# Patient Record
Sex: Male | Born: 1945
Health system: Southern US, Community
[De-identification: ages and names within clinical notes are randomized; demographics above are authoritative.]

## PROBLEM LIST (undated history)

## (undated) DIAGNOSIS — N210 Calculus in bladder: Secondary | ICD-10-CM

## (undated) DIAGNOSIS — Z87442 Personal history of urinary calculi: Secondary | ICD-10-CM

## (undated) DIAGNOSIS — Z973 Presence of spectacles and contact lenses: Secondary | ICD-10-CM

## (undated) DIAGNOSIS — H269 Unspecified cataract: Secondary | ICD-10-CM

## (undated) DIAGNOSIS — K219 Gastro-esophageal reflux disease without esophagitis: Secondary | ICD-10-CM

## (undated) DIAGNOSIS — R3915 Urgency of urination: Secondary | ICD-10-CM

## (undated) DIAGNOSIS — M4800 Spinal stenosis, site unspecified: Secondary | ICD-10-CM

## (undated) DIAGNOSIS — I10 Essential (primary) hypertension: Secondary | ICD-10-CM

## (undated) HISTORY — PX: EYE SURGERY: SHX253

## (undated) HISTORY — PX: INGUINAL HERNIA REPAIR: SUR1180

## (undated) HISTORY — DX: Unspecified cataract: H26.9

## (undated) HISTORY — PX: TONSILLECTOMY: SUR1361

## (undated) HISTORY — PX: BLEPHAROPLASTY: SUR158

## (undated) HISTORY — DX: Spinal stenosis, site unspecified: M48.00

---

## 2002-03-24 HISTORY — PX: EXTRACORPOREAL SHOCK WAVE LITHOTRIPSY: SHX1557

## 2012-01-15 DIAGNOSIS — Z23 Encounter for immunization: Secondary | ICD-10-CM | POA: Diagnosis not present

## 2012-01-15 DIAGNOSIS — Z0000 Encounter for general adult medical examination without abnormal findings: Secondary | ICD-10-CM | POA: Diagnosis not present

## 2012-01-19 DIAGNOSIS — Z1211 Encounter for screening for malignant neoplasm of colon: Secondary | ICD-10-CM | POA: Diagnosis not present

## 2012-01-22 DIAGNOSIS — E559 Vitamin D deficiency, unspecified: Secondary | ICD-10-CM | POA: Diagnosis not present

## 2012-01-22 DIAGNOSIS — E569 Vitamin deficiency, unspecified: Secondary | ICD-10-CM | POA: Diagnosis not present

## 2012-01-22 DIAGNOSIS — E782 Mixed hyperlipidemia: Secondary | ICD-10-CM | POA: Diagnosis not present

## 2012-01-22 DIAGNOSIS — Z79899 Other long term (current) drug therapy: Secondary | ICD-10-CM | POA: Diagnosis not present

## 2012-01-22 DIAGNOSIS — Z125 Encounter for screening for malignant neoplasm of prostate: Secondary | ICD-10-CM | POA: Diagnosis not present

## 2012-01-28 DIAGNOSIS — D539 Nutritional anemia, unspecified: Secondary | ICD-10-CM | POA: Diagnosis not present

## 2012-02-02 DIAGNOSIS — M5380 Other specified dorsopathies, site unspecified: Secondary | ICD-10-CM | POA: Diagnosis not present

## 2012-02-02 DIAGNOSIS — M25659 Stiffness of unspecified hip, not elsewhere classified: Secondary | ICD-10-CM | POA: Diagnosis not present

## 2012-02-02 DIAGNOSIS — M999 Biomechanical lesion, unspecified: Secondary | ICD-10-CM | POA: Diagnosis not present

## 2012-02-03 DIAGNOSIS — M5380 Other specified dorsopathies, site unspecified: Secondary | ICD-10-CM | POA: Diagnosis not present

## 2012-02-03 DIAGNOSIS — M999 Biomechanical lesion, unspecified: Secondary | ICD-10-CM | POA: Diagnosis not present

## 2012-02-03 DIAGNOSIS — M25659 Stiffness of unspecified hip, not elsewhere classified: Secondary | ICD-10-CM | POA: Diagnosis not present

## 2012-02-04 DIAGNOSIS — I10 Essential (primary) hypertension: Secondary | ICD-10-CM | POA: Diagnosis not present

## 2012-09-22 DIAGNOSIS — M545 Low back pain: Secondary | ICD-10-CM | POA: Diagnosis not present

## 2012-09-22 DIAGNOSIS — M25559 Pain in unspecified hip: Secondary | ICD-10-CM | POA: Diagnosis not present

## 2012-09-27 DIAGNOSIS — M545 Low back pain: Secondary | ICD-10-CM | POA: Diagnosis not present

## 2012-09-27 DIAGNOSIS — M439 Deforming dorsopathy, unspecified: Secondary | ICD-10-CM | POA: Diagnosis not present

## 2012-09-27 DIAGNOSIS — M25559 Pain in unspecified hip: Secondary | ICD-10-CM | POA: Diagnosis not present

## 2012-10-04 DIAGNOSIS — M25559 Pain in unspecified hip: Secondary | ICD-10-CM | POA: Diagnosis not present

## 2012-10-04 DIAGNOSIS — M545 Low back pain: Secondary | ICD-10-CM | POA: Diagnosis not present

## 2012-10-04 DIAGNOSIS — M439 Deforming dorsopathy, unspecified: Secondary | ICD-10-CM | POA: Diagnosis not present

## 2012-10-08 DIAGNOSIS — M439 Deforming dorsopathy, unspecified: Secondary | ICD-10-CM | POA: Diagnosis not present

## 2012-10-08 DIAGNOSIS — M25559 Pain in unspecified hip: Secondary | ICD-10-CM | POA: Diagnosis not present

## 2012-10-08 DIAGNOSIS — M545 Low back pain: Secondary | ICD-10-CM | POA: Diagnosis not present

## 2012-10-11 DIAGNOSIS — M25559 Pain in unspecified hip: Secondary | ICD-10-CM | POA: Diagnosis not present

## 2012-10-11 DIAGNOSIS — M545 Low back pain: Secondary | ICD-10-CM | POA: Diagnosis not present

## 2012-10-11 DIAGNOSIS — M439 Deforming dorsopathy, unspecified: Secondary | ICD-10-CM | POA: Diagnosis not present

## 2012-10-18 DIAGNOSIS — M439 Deforming dorsopathy, unspecified: Secondary | ICD-10-CM | POA: Diagnosis not present

## 2012-10-18 DIAGNOSIS — M545 Low back pain: Secondary | ICD-10-CM | POA: Diagnosis not present

## 2012-10-18 DIAGNOSIS — M25559 Pain in unspecified hip: Secondary | ICD-10-CM | POA: Diagnosis not present

## 2012-10-25 DIAGNOSIS — M545 Low back pain: Secondary | ICD-10-CM | POA: Diagnosis not present

## 2012-10-25 DIAGNOSIS — M439 Deforming dorsopathy, unspecified: Secondary | ICD-10-CM | POA: Diagnosis not present

## 2012-10-25 DIAGNOSIS — M25559 Pain in unspecified hip: Secondary | ICD-10-CM | POA: Diagnosis not present

## 2012-10-27 DIAGNOSIS — M25559 Pain in unspecified hip: Secondary | ICD-10-CM | POA: Diagnosis not present

## 2012-10-27 DIAGNOSIS — M439 Deforming dorsopathy, unspecified: Secondary | ICD-10-CM | POA: Diagnosis not present

## 2012-10-27 DIAGNOSIS — M545 Low back pain: Secondary | ICD-10-CM | POA: Diagnosis not present

## 2012-10-27 DIAGNOSIS — I10 Essential (primary) hypertension: Secondary | ICD-10-CM | POA: Diagnosis not present

## 2012-11-01 DIAGNOSIS — M545 Low back pain: Secondary | ICD-10-CM | POA: Diagnosis not present

## 2012-11-01 DIAGNOSIS — M25559 Pain in unspecified hip: Secondary | ICD-10-CM | POA: Diagnosis not present

## 2012-11-01 DIAGNOSIS — M439 Deforming dorsopathy, unspecified: Secondary | ICD-10-CM | POA: Diagnosis not present

## 2012-11-02 DIAGNOSIS — H40019 Open angle with borderline findings, low risk, unspecified eye: Secondary | ICD-10-CM | POA: Diagnosis not present

## 2012-11-02 DIAGNOSIS — H52209 Unspecified astigmatism, unspecified eye: Secondary | ICD-10-CM | POA: Diagnosis not present

## 2012-11-02 DIAGNOSIS — H40059 Ocular hypertension, unspecified eye: Secondary | ICD-10-CM | POA: Diagnosis not present

## 2012-11-02 DIAGNOSIS — H2510 Age-related nuclear cataract, unspecified eye: Secondary | ICD-10-CM | POA: Diagnosis not present

## 2012-11-05 DIAGNOSIS — M439 Deforming dorsopathy, unspecified: Secondary | ICD-10-CM | POA: Diagnosis not present

## 2012-11-05 DIAGNOSIS — M25559 Pain in unspecified hip: Secondary | ICD-10-CM | POA: Diagnosis not present

## 2012-11-05 DIAGNOSIS — M545 Low back pain: Secondary | ICD-10-CM | POA: Diagnosis not present

## 2012-11-08 DIAGNOSIS — M439 Deforming dorsopathy, unspecified: Secondary | ICD-10-CM | POA: Diagnosis not present

## 2012-11-08 DIAGNOSIS — M545 Low back pain: Secondary | ICD-10-CM | POA: Diagnosis not present

## 2012-11-08 DIAGNOSIS — M25559 Pain in unspecified hip: Secondary | ICD-10-CM | POA: Diagnosis not present

## 2012-11-15 DIAGNOSIS — M439 Deforming dorsopathy, unspecified: Secondary | ICD-10-CM | POA: Diagnosis not present

## 2012-11-15 DIAGNOSIS — M545 Low back pain: Secondary | ICD-10-CM | POA: Diagnosis not present

## 2012-11-15 DIAGNOSIS — M25559 Pain in unspecified hip: Secondary | ICD-10-CM | POA: Diagnosis not present

## 2012-11-22 DIAGNOSIS — M439 Deforming dorsopathy, unspecified: Secondary | ICD-10-CM | POA: Diagnosis not present

## 2012-11-22 DIAGNOSIS — M25559 Pain in unspecified hip: Secondary | ICD-10-CM | POA: Diagnosis not present

## 2012-11-22 DIAGNOSIS — M545 Low back pain: Secondary | ICD-10-CM | POA: Diagnosis not present

## 2012-12-01 DIAGNOSIS — M439 Deforming dorsopathy, unspecified: Secondary | ICD-10-CM | POA: Diagnosis not present

## 2012-12-01 DIAGNOSIS — M545 Low back pain: Secondary | ICD-10-CM | POA: Diagnosis not present

## 2012-12-01 DIAGNOSIS — M25559 Pain in unspecified hip: Secondary | ICD-10-CM | POA: Diagnosis not present

## 2012-12-06 DIAGNOSIS — M439 Deforming dorsopathy, unspecified: Secondary | ICD-10-CM | POA: Diagnosis not present

## 2012-12-06 DIAGNOSIS — M545 Low back pain: Secondary | ICD-10-CM | POA: Diagnosis not present

## 2012-12-06 DIAGNOSIS — M25559 Pain in unspecified hip: Secondary | ICD-10-CM | POA: Diagnosis not present

## 2012-12-10 DIAGNOSIS — M25559 Pain in unspecified hip: Secondary | ICD-10-CM | POA: Diagnosis not present

## 2012-12-10 DIAGNOSIS — M545 Low back pain: Secondary | ICD-10-CM | POA: Diagnosis not present

## 2012-12-10 DIAGNOSIS — M439 Deforming dorsopathy, unspecified: Secondary | ICD-10-CM | POA: Diagnosis not present

## 2012-12-13 DIAGNOSIS — M25559 Pain in unspecified hip: Secondary | ICD-10-CM | POA: Diagnosis not present

## 2012-12-13 DIAGNOSIS — M439 Deforming dorsopathy, unspecified: Secondary | ICD-10-CM | POA: Diagnosis not present

## 2012-12-13 DIAGNOSIS — M545 Low back pain: Secondary | ICD-10-CM | POA: Diagnosis not present

## 2012-12-17 DIAGNOSIS — M25559 Pain in unspecified hip: Secondary | ICD-10-CM | POA: Diagnosis not present

## 2012-12-17 DIAGNOSIS — M439 Deforming dorsopathy, unspecified: Secondary | ICD-10-CM | POA: Diagnosis not present

## 2012-12-17 DIAGNOSIS — M545 Low back pain: Secondary | ICD-10-CM | POA: Diagnosis not present

## 2012-12-20 DIAGNOSIS — M25559 Pain in unspecified hip: Secondary | ICD-10-CM | POA: Diagnosis not present

## 2012-12-20 DIAGNOSIS — M439 Deforming dorsopathy, unspecified: Secondary | ICD-10-CM | POA: Diagnosis not present

## 2012-12-20 DIAGNOSIS — M545 Low back pain: Secondary | ICD-10-CM | POA: Diagnosis not present

## 2013-01-17 DIAGNOSIS — Z23 Encounter for immunization: Secondary | ICD-10-CM | POA: Diagnosis not present

## 2013-02-14 DIAGNOSIS — Z125 Encounter for screening for malignant neoplasm of prostate: Secondary | ICD-10-CM | POA: Diagnosis not present

## 2013-02-21 DIAGNOSIS — Z1211 Encounter for screening for malignant neoplasm of colon: Secondary | ICD-10-CM | POA: Diagnosis not present

## 2013-11-08 DIAGNOSIS — H40059 Ocular hypertension, unspecified eye: Secondary | ICD-10-CM | POA: Diagnosis not present

## 2013-11-08 DIAGNOSIS — H52209 Unspecified astigmatism, unspecified eye: Secondary | ICD-10-CM | POA: Diagnosis not present

## 2013-11-08 DIAGNOSIS — H40019 Open angle with borderline findings, low risk, unspecified eye: Secondary | ICD-10-CM | POA: Diagnosis not present

## 2014-01-11 DIAGNOSIS — Z23 Encounter for immunization: Secondary | ICD-10-CM | POA: Diagnosis not present

## 2014-04-28 DIAGNOSIS — Z0000 Encounter for general adult medical examination without abnormal findings: Secondary | ICD-10-CM | POA: Diagnosis not present

## 2014-04-28 DIAGNOSIS — L219 Seborrheic dermatitis, unspecified: Secondary | ICD-10-CM | POA: Diagnosis not present

## 2014-04-28 DIAGNOSIS — Z23 Encounter for immunization: Secondary | ICD-10-CM | POA: Diagnosis not present

## 2014-04-28 DIAGNOSIS — E782 Mixed hyperlipidemia: Secondary | ICD-10-CM | POA: Diagnosis not present

## 2014-04-28 DIAGNOSIS — Z125 Encounter for screening for malignant neoplasm of prostate: Secondary | ICD-10-CM | POA: Diagnosis not present

## 2014-04-28 DIAGNOSIS — I10 Essential (primary) hypertension: Secondary | ICD-10-CM | POA: Diagnosis not present

## 2014-04-28 DIAGNOSIS — Z79899 Other long term (current) drug therapy: Secondary | ICD-10-CM | POA: Diagnosis not present

## 2014-04-28 DIAGNOSIS — E780 Pure hypercholesterolemia: Secondary | ICD-10-CM | POA: Diagnosis not present

## 2014-05-02 DIAGNOSIS — Z1211 Encounter for screening for malignant neoplasm of colon: Secondary | ICD-10-CM | POA: Diagnosis not present

## 2014-05-16 DIAGNOSIS — H40053 Ocular hypertension, bilateral: Secondary | ICD-10-CM | POA: Diagnosis not present

## 2014-05-16 DIAGNOSIS — H40013 Open angle with borderline findings, low risk, bilateral: Secondary | ICD-10-CM | POA: Diagnosis not present

## 2014-06-12 DIAGNOSIS — N200 Calculus of kidney: Secondary | ICD-10-CM | POA: Diagnosis not present

## 2014-06-12 DIAGNOSIS — N410 Acute prostatitis: Secondary | ICD-10-CM | POA: Diagnosis not present

## 2014-06-14 DIAGNOSIS — N410 Acute prostatitis: Secondary | ICD-10-CM | POA: Diagnosis not present

## 2014-06-17 ENCOUNTER — Emergency Department (HOSPITAL_COMMUNITY)
Admission: EM | Admit: 2014-06-17 | Discharge: 2014-06-18 | Disposition: A | Discharge status: Home / Self Care | Payer: Medicare Other | Attending: Emergency Medicine | Admitting: Emergency Medicine

## 2014-06-17 ENCOUNTER — Encounter (HOSPITAL_COMMUNITY): Payer: Self-pay | Admitting: Oncology

## 2014-06-17 ENCOUNTER — Emergency Department (HOSPITAL_COMMUNITY): Payer: Medicare Other

## 2014-06-17 DIAGNOSIS — N134 Hydroureter: Secondary | ICD-10-CM | POA: Diagnosis not present

## 2014-06-17 DIAGNOSIS — Z792 Long term (current) use of antibiotics: Secondary | ICD-10-CM | POA: Diagnosis not present

## 2014-06-17 DIAGNOSIS — I10 Essential (primary) hypertension: Secondary | ICD-10-CM | POA: Insufficient documentation

## 2014-06-17 DIAGNOSIS — N23 Unspecified renal colic: Secondary | ICD-10-CM

## 2014-06-17 DIAGNOSIS — Z79899 Other long term (current) drug therapy: Secondary | ICD-10-CM | POA: Insufficient documentation

## 2014-06-17 DIAGNOSIS — Z87442 Personal history of urinary calculi: Secondary | ICD-10-CM | POA: Insufficient documentation

## 2014-06-17 DIAGNOSIS — R109 Unspecified abdominal pain: Secondary | ICD-10-CM

## 2014-06-17 DIAGNOSIS — Z791 Long term (current) use of non-steroidal anti-inflammatories (NSAID): Secondary | ICD-10-CM | POA: Insufficient documentation

## 2014-06-17 DIAGNOSIS — N201 Calculus of ureter: Secondary | ICD-10-CM | POA: Diagnosis not present

## 2014-06-17 DIAGNOSIS — N1330 Unspecified hydronephrosis: Secondary | ICD-10-CM | POA: Diagnosis not present

## 2014-06-17 DIAGNOSIS — R1030 Lower abdominal pain, unspecified: Secondary | ICD-10-CM | POA: Diagnosis not present

## 2014-06-17 HISTORY — DX: Essential (primary) hypertension: I10

## 2014-06-17 LAB — COMPREHENSIVE METABOLIC PANEL
ALT: 20 U/L (ref 0–53)
AST: 27 U/L (ref 0–37)
Albumin: 4.7 g/dL (ref 3.5–5.2)
Alkaline Phosphatase: 49 U/L (ref 39–117)
Anion gap: 12 (ref 5–15)
BUN: 25 mg/dL — ABNORMAL HIGH (ref 6–23)
CO2: 22 mmol/L (ref 19–32)
Calcium: 8.9 mg/dL (ref 8.4–10.5)
Chloride: 101 mmol/L (ref 96–112)
Creatinine, Ser: 1.24 mg/dL (ref 0.50–1.35)
GFR calc Af Amer: 67 mL/min — ABNORMAL LOW (ref >90)
GFR calc non Af Amer: 58 mL/min — ABNORMAL LOW (ref >90)
Glucose, Bld: 109 mg/dL — ABNORMAL HIGH (ref 70–99)
Potassium: 4 mmol/L (ref 3.5–5.1)
Sodium: 135 mmol/L (ref 135–145)
Total Bilirubin: 1.2 mg/dL (ref 0.3–1.2)
Total Protein: 7.4 g/dL (ref 6.0–8.3)

## 2014-06-17 LAB — URINE MICROSCOPIC-ADD ON

## 2014-06-17 LAB — URINALYSIS, ROUTINE W REFLEX MICROSCOPIC
Bilirubin Urine: NEGATIVE
Glucose, UA: NEGATIVE mg/dL
Ketones, ur: 15 mg/dL — AB
Leukocytes, UA: NEGATIVE
Nitrite: NEGATIVE
Protein, ur: NEGATIVE mg/dL
Specific Gravity, Urine: 1.027 (ref 1.005–1.030)
Urobilinogen, UA: 0.2 mg/dL (ref 0.0–1.0)
pH: 5 (ref 5.0–8.0)

## 2014-06-17 LAB — CBC
HCT: 42 % (ref 39.0–52.0)
Hemoglobin: 14.4 g/dL (ref 13.0–17.0)
MCH: 32.3 pg (ref 26.0–34.0)
MCHC: 34.3 g/dL (ref 30.0–36.0)
MCV: 94.2 fL (ref 78.0–100.0)
Platelets: 225 10*3/uL (ref 150–400)
RBC: 4.46 MIL/uL (ref 4.22–5.81)
RDW: 12 % (ref 11.5–15.5)
WBC: 11.4 10*3/uL — ABNORMAL HIGH (ref 4.0–10.5)

## 2014-06-17 MED ORDER — ONDANSETRON HCL 4 MG/2ML IJ SOLN
4.0000 mg | Freq: Once | INTRAMUSCULAR | Status: AC
  Administered 2014-06-17: 4 mg via INTRAVENOUS
  Filled 2014-06-17: qty 2

## 2014-06-17 MED ORDER — TAMSULOSIN HCL 0.4 MG PO CAPS: 0.4000 mg | ORAL_CAPSULE | Freq: Every day | ORAL | Status: DC

## 2014-06-17 MED ORDER — HYDROMORPHONE HCL 1 MG/ML IJ SOLN
1.0000 mg | Freq: Once | INTRAMUSCULAR | Status: AC
  Administered 2014-06-17: 1 mg via INTRAVENOUS
  Filled 2014-06-17: qty 1

## 2014-06-17 MED ORDER — OXYCODONE-ACETAMINOPHEN 5-325 MG PO TABS: 1.0000 | ORAL_TABLET | Freq: Four times a day (QID) | ORAL | Status: DC | PRN

## 2014-06-17 NOTE — ED Provider Notes (Signed)
CSN: [id number]     Arrival date & time 06/17/14  2026 History   First MD Initiated Contact with Patient 06/17/14 2044     Chief Complaint  Patient presents with  . Flank Pain     (Consider location/radiation/quality/duration/timing/severity/associated sxs/prior Treatment) HPI Comments: Patients with history of ureteral stones 5, one episode of lithotripsy in the past, previously from [place] --presents with complaint of acute onset of left flank pain at approximately 5 PM with radiation to his back. Pain was severe. Patient states that this feels like previous kidney stones. No nausea or vomiting. Patient took naproxen with little relief of pain. Patient was seen by his primary care physician approximately one week ago for urinary frequency and lower abdominal pain. He is treated for prostatitis with Cipro and Flomax. He noted improvement in his symptoms during the course of the week. He currently has no hematuria or dysuria. No history of AAA. Patient does take Toprol and irbesartan for high blood pressure. Nothing makes symptoms better or worse. Pain is constant.  Patient is a 69 y.o. male presenting with flank pain. The history is provided by the patient.  Flank Pain Pertinent negatives include no abdominal pain, chest pain, coughing, fever, headaches, myalgias, nausea, rash, sore throat or vomiting.    Past Medical History  Diagnosis Date  . Hypertension   . Kidney stones    Past Surgical History  Procedure Laterality Date  . Hermia repair  2006  . Tonsillectomy    . Lithotripsy     History reviewed. No pertinent family history. History  Substance Use Topics  . Smoking status: Never Smoker   . Smokeless tobacco: Not on file  . Alcohol Use: Yes     Comment: Occasionally    Review of Systems  Constitutional: Negative for fever.  HENT: Negative for rhinorrhea and sore throat.   Eyes: Negative for redness.  Respiratory: Negative for cough.   Cardiovascular: Negative for  chest pain.  Gastrointestinal: Negative for nausea, vomiting, abdominal pain and diarrhea.  Genitourinary: Positive for flank pain. Negative for dysuria, hematuria and testicular pain.  Musculoskeletal: Negative for myalgias.  Skin: Negative for rash.  Neurological: Negative for headaches.    Allergies  Review of patient's allergies indicates no known allergies.  Home Medications   Prior to Admission medications   Medication Sig Start Date End Date Taking? Authorizing Provider  calcium carbonate (TUMS - DOSED IN MG ELEMENTAL CALCIUM) 500 MG chewable tablet Chew 1 tablet by mouth 3 (three) times daily as needed for indigestion or heartburn.   Yes Historical Provider, MD  ciprofloxacin (CIPRO) 500 MG tablet Take 500 mg by mouth 2 (two) times daily. 06/12/14  Yes Historical Provider, MD  irbesartan (AVAPRO) 300 MG tablet Take 300 mg by mouth daily.   Yes Historical Provider, MD  metoprolol succinate (TOPROL-XL) 25 MG 24 hr tablet Take 25 mg by mouth daily.   Yes Historical Provider, MD  naproxen (NAPROSYN) 500 MG tablet Take 500 mg by mouth 2 (two) times daily as needed for moderate pain.   Yes Historical Provider, MD  omeprazole (PRILOSEC) 40 MG capsule Take 40 mg by mouth daily as needed (heart burn).  04/29/14  Yes Historical Provider, MD  tamsulosin (FLOMAX) 0.4 MG CAPS capsule Take 0.4 mg by mouth daily.   Yes Historical Provider, MD   BP 164/82 mmHg  Pulse 89  Temp(Src) 97.9 F (36.6 C) (Oral)  Resp 18  Ht 5\' 8"  (1.727 m)  Wt 161 lb (73.029 kg)  BMI 24.49 kg/m2  SpO2 99%   Physical Exam  Constitutional: He appears well-developed and well-nourished.  HENT:  Head: Normocephalic and atraumatic.  Eyes: Conjunctivae are normal. Right eye exhibits no discharge. Left eye exhibits no discharge.  Neck: Normal range of motion. Neck supple.  Cardiovascular: Normal rate, regular rhythm and normal heart sounds.   Pulmonary/Chest: Effort normal and breath sounds normal.  Abdominal: Soft.  Bowel sounds are normal. He exhibits no abdominal bruit and no pulsatile midline mass. There is no tenderness. There is CVA tenderness (left).  Neurological: He is alert.  Skin: Skin is warm and dry.  Psychiatric: He has a normal mood and affect.  Nursing note and vitals reviewed.   ED Course  Procedures (including critical care time) Labs Review Labs Reviewed  URINALYSIS, ROUTINE W REFLEX MICROSCOPIC - Abnormal; Notable for the following:    APPearance CLOUDY (*)    Hgb urine dipstick MODERATE (*)    Ketones, ur 15 (*)    All other components within normal limits  CBC - Abnormal; Notable for the following:    WBC 11.4 (*)    All other components within normal limits  COMPREHENSIVE METABOLIC PANEL - Abnormal; Notable for the following:    Glucose, Bld 109 (*)    BUN 25 (*)    GFR calc non Af Amer 58 (*)    GFR calc Af Amer 67 (*)    All other components within normal limits  URINE MICROSCOPIC-ADD ON - Abnormal; Notable for the following:    Bacteria, UA FEW (*)    Crystals CA OXALATE CRYSTALS (*)    All other components within normal limits    Imaging Review Ct Renal Stone Study  06/17/2014   CLINICAL DATA:  Right flank pain.  EXAM: CT ABDOMEN AND PELVIS WITHOUT CONTRAST  TECHNIQUE: Multidetector CT imaging of the abdomen and pelvis was performed following the standard protocol without IV contrast.  COMPARISON:  None.  FINDINGS: There is moderate left hydronephrosis and hydroureter with extensive left perinephric stranding. 8 mm stone projects near the midline of the bladder. This conceivably could be at the left UVJ or recently passed into the bladder (favored). No hydronephrosis on the right. Bilateral nonobstructing renal stones, the largest in the right lower pole measuring 8 mm.  Liver, gallbladder, spleen, pancreas and adrenals have an unremarkable unenhanced appearance. Sigmoid diverticulosis. No active diverticulitis. Stomach and small bowel are decompressed, grossly  unremarkable. Aorta is normal caliber.  No free fluid or free air. Prostate is enlarged with calcifications. Aorta is normal caliber.  No acute bony abnormality or focal bone lesion. Degenerative disc and facet disease in the lumbar spine.  IMPRESSION: Moderate left hydronephrosis and hydroureter with extensive perinephric stranding. 8 mm stone projects near the midline of the bladder, presumably recently passed from the left ureter. It is conceivable this could still be at the left ureterovesical junction protruding into the bladder.   Electronically Signed   By: Rolm Baptise M.D.   On: 06/17/2014 22:15     EKG Interpretation None      9:23 PM Patient seen and examined. Work-up initiated. Medications ordered. Discussed with Dr. Wilson Singer.   Vital signs reviewed and are as follows: BP 164/82 mmHg  Pulse 89  Temp(Src) 97.9 F (36.6 C) (Oral)  Resp 18  Ht 5\' 8"  (1.727 m)  Wt 161 lb (73.029 kg)  BMI 24.49 kg/m2  SpO2 99%  11:50 PM Pt seen previously with Dr. Wilson Singer.   CT shows left  hydronephrosis, hydroureter with 8 mm stone likely in the bladder. Clinically, the patient is much improved. He is comfortable for discharge home. Patient is finishing a course of Flomax, I will prescribe 5 additional capsules to give him a week's worth.  Patient counseled on kidney stone treatment. Urged patient to strain urine and save any stones. Urged urology follow-up and return to [facility] with any complications. Counseled patient to maintain good fluid intake.   Counseled patient on use of Flomax.   Patient counseled on use of narcotic pain medications. Counseled not to combine these medications with others containing tylenol. Urged not to drink alcohol, drive, or perform any other activities that requires focus while taking these medications. The patient verbalizes understanding and agrees with the plan.   MDM   Final diagnoses:  Flank pain  Ureteral colic   Patient with left flank pain, CT suggestive of  recently passed left ureteral stone. Urine does not demonstrate infection. Normal kidney function. Patient given urology follow-up so that he can establish care here. Pain is controlled. Discharged to home on symptomatic treatment.    Carlisle Cater, PA-C 06/17/14 [place], MD 06/21/14 [id number]

## 2014-06-17 NOTE — ED Notes (Signed)
Per pt he has been having urinary frequency and pain in his lower abdomen.  Pt saw PCP and was prescribed cipro for possible prostatitis.  Pt presents today w/ left flank pain that radiates around his back.

## 2014-06-17 NOTE — Discharge Instructions (Signed)
Please read and follow all provided instructions.  Your diagnoses today include:  1. Ureteral colic   2. Flank pain     Tests performed today include:  Urine test that showed blood in your urine and no infection  CT scan which showed a 8 millimeter kidney that appears to be in the bladder  Blood test that showed normal kidney function  Vital signs. See below for your results today.   Medications prescribed:   Flomax (tamsulosin) - relaxes smooth muscle to help kidney stones pass   Percocet (oxycodone/acetaminophen) - narcotic pain medication  DO NOT drive or perform any activities that require you to be awake and alert because this medicine can make you drowsy. BE VERY CAREFUL not to take multiple medicines containing Tylenol (also called acetaminophen). Doing so can lead to an overdose which can damage your liver and cause liver failure and possibly death.  Take any prescribed medications only as directed.  Home care instructions:  Follow any educational materials contained in this packet.  Please double your fluid intake for the next several days. Strain your urine and save any stones that may pass.   BE VERY CAREFUL not to take multiple medicines containing Tylenol (also called acetaminophen). Doing so can lead to an overdose which can damage your liver and cause liver failure and possibly death.   Follow-up instructions: Please follow-up with your urologist or the urologist referral (provided on front page) in the next 1 week for further evaluation of your symptoms.  If you need to return to the Emergency Department, go to [facility] and not [facility]. The urologists are located at [facility] and can better care for you at this location.  Return instructions:  If you need to return to the Emergency Department, go to [facility] and not [facility]. The urologists are located at [facility] and can better care for you at this  location.   Please return to the Emergency Department if you experience worsening symptoms.  Please return if you develop fever or uncontrolled pain or vomiting.  Please return if you have any other emergent concerns.  Additional Information:  Your vital signs today were: BP 142/70 mmHg   Pulse 77   Temp(Src) 97.9 F (36.6 C) (Oral)   Resp 17   Ht 5\' 8"  (1.727 m)   Wt 161 lb (73.029 kg)   BMI 24.49 kg/m2   SpO2 97% If your blood pressure (BP) was elevated above 135/85 this visit, please have this repeated by your doctor within one month. --------------

## 2014-08-07 DIAGNOSIS — N210 Calculus in bladder: Secondary | ICD-10-CM | POA: Diagnosis not present

## 2014-08-07 DIAGNOSIS — R3912 Poor urinary stream: Secondary | ICD-10-CM | POA: Diagnosis not present

## 2014-08-07 DIAGNOSIS — N401 Enlarged prostate with lower urinary tract symptoms: Secondary | ICD-10-CM | POA: Diagnosis not present

## 2014-08-07 DIAGNOSIS — N138 Other obstructive and reflux uropathy: Secondary | ICD-10-CM | POA: Diagnosis not present

## 2014-08-07 DIAGNOSIS — N200 Calculus of kidney: Secondary | ICD-10-CM | POA: Diagnosis not present

## 2014-08-09 ENCOUNTER — Other Ambulatory Visit: Payer: Self-pay | Admitting: Urology

## 2014-08-16 ENCOUNTER — Encounter (HOSPITAL_BASED_OUTPATIENT_CLINIC_OR_DEPARTMENT_OTHER): Payer: Self-pay | Admitting: *Deleted

## 2014-08-16 NOTE — Progress Notes (Signed)
NPO AFTER MN. ARRIVE AT 0700.  NEEDS ISTAT AND EKG. WILL TAKE AVAPRO AND PRILOSEC AM DOS W/ SIPS OF WATER.

## 2014-08-22 DIAGNOSIS — N210 Calculus in bladder: Secondary | ICD-10-CM | POA: Diagnosis not present

## 2014-08-22 DIAGNOSIS — N200 Calculus of kidney: Secondary | ICD-10-CM | POA: Diagnosis not present

## 2014-08-22 DIAGNOSIS — R3912 Poor urinary stream: Secondary | ICD-10-CM | POA: Diagnosis not present

## 2014-08-23 ENCOUNTER — Ambulatory Visit (HOSPITAL_BASED_OUTPATIENT_CLINIC_OR_DEPARTMENT_OTHER): Admission: RE | Admit: 2014-08-23 | Payer: Medicare Other | Source: Ambulatory Visit | Admitting: Urology

## 2014-08-23 HISTORY — DX: Urgency of urination: R39.15

## 2014-08-23 HISTORY — DX: Calculus in bladder: N21.0

## 2014-08-23 HISTORY — DX: Gastro-esophageal reflux disease without esophagitis: K21.9

## 2014-08-23 HISTORY — DX: Presence of spectacles and contact lenses: Z97.3

## 2014-08-23 HISTORY — DX: Personal history of urinary calculi: Z87.442

## 2014-08-23 SURGERY — CYSTOSCOPY, WITH CALCULUS REMOVAL USING BASKET
Anesthesia: General | Laterality: Bilateral

## 2014-10-20 DIAGNOSIS — L570 Actinic keratosis: Secondary | ICD-10-CM | POA: Diagnosis not present

## 2014-10-20 DIAGNOSIS — L578 Other skin changes due to chronic exposure to nonionizing radiation: Secondary | ICD-10-CM | POA: Diagnosis not present

## 2014-10-20 DIAGNOSIS — L821 Other seborrheic keratosis: Secondary | ICD-10-CM | POA: Diagnosis not present

## 2014-10-31 DIAGNOSIS — H5213 Myopia, bilateral: Secondary | ICD-10-CM | POA: Diagnosis not present

## 2014-10-31 DIAGNOSIS — H40013 Open angle with borderline findings, low risk, bilateral: Secondary | ICD-10-CM | POA: Diagnosis not present

## 2014-10-31 DIAGNOSIS — H2513 Age-related nuclear cataract, bilateral: Secondary | ICD-10-CM | POA: Diagnosis not present

## 2014-12-05 DIAGNOSIS — N210 Calculus in bladder: Secondary | ICD-10-CM | POA: Diagnosis not present

## 2014-12-05 DIAGNOSIS — N401 Enlarged prostate with lower urinary tract symptoms: Secondary | ICD-10-CM | POA: Diagnosis not present

## 2014-12-05 DIAGNOSIS — N138 Other obstructive and reflux uropathy: Secondary | ICD-10-CM | POA: Diagnosis not present

## 2014-12-05 DIAGNOSIS — N200 Calculus of kidney: Secondary | ICD-10-CM | POA: Diagnosis not present

## 2015-01-02 DIAGNOSIS — Z23 Encounter for immunization: Secondary | ICD-10-CM | POA: Diagnosis not present

## 2015-04-11 DIAGNOSIS — M5416 Radiculopathy, lumbar region: Secondary | ICD-10-CM | POA: Diagnosis not present

## 2015-04-11 DIAGNOSIS — M25552 Pain in left hip: Secondary | ICD-10-CM | POA: Diagnosis not present

## 2015-04-27 ENCOUNTER — Ambulatory Visit (INDEPENDENT_AMBULATORY_CARE_PROVIDER_SITE_OTHER): Payer: Medicare Other | Admitting: Sports Medicine

## 2015-04-27 ENCOUNTER — Encounter: Payer: Self-pay | Admitting: Sports Medicine

## 2015-04-27 ENCOUNTER — Other Ambulatory Visit: Payer: Self-pay | Admitting: Sports Medicine

## 2015-04-27 VITALS — BP 122/82 | HR 112 | Resp 16 | Wt 161.0 lb

## 2015-04-27 DIAGNOSIS — M4806 Spinal stenosis, lumbar region: Secondary | ICD-10-CM | POA: Diagnosis not present

## 2015-04-27 DIAGNOSIS — M48061 Spinal stenosis, lumbar region without neurogenic claudication: Secondary | ICD-10-CM | POA: Insufficient documentation

## 2015-04-27 MED ORDER — MELOXICAM 15 MG PO TABS: ORAL_TABLET | ORAL | Status: DC

## 2015-04-27 MED ORDER — GABAPENTIN 100 MG PO CAPS: ORAL_CAPSULE | ORAL | Status: DC

## 2015-04-27 NOTE — Assessment & Plan Note (Signed)
Bilateral hip osteoarthritis, hip is not a pain generator based on exam today. He does have moderate lumbar spinal stenosis at the L3-L4 and the L4-L5 levels with facet arthritis at these levels as well. Considering pain is better with flexion, and pain is better with flexion he likely has an element of neurogenic claudication as well. Formal physical therapy, meloxicam, low-dose gabapentin. Return to see me in one month, MRI for epidural planning if no better.

## 2015-04-27 NOTE — Progress Notes (Signed)
   Subjective:    I'm seeing this patient as a consultation for:  Dr. Ann Held  CC: Hip and back pain  HPI: This is a pleasant 70 year old male, he comes in with a years long history of pain that he localizes in his back, as well as posterior left hip with radiation to the lateral thigh. Pain is worse with standing, and with ambulation to the point where he has to stop and bend forward to relieve the symptoms. Overall pain is better in the morning, not typical for osteoarthritis. Pain is moderate, persistent. He is taking no medications at this time but is agreeable to try something conservative to start with. He is also agreeable to do formal physical therapy, or constitutional symptoms, no bowel or bladder dysfunction or saddle numbness. He tells me the pain is worse when he tries to extend his spine and stand up straight.    Past medical history, Surgical history, Family history not pertinant except as noted below, Social history, Allergies, and medications have been entered into the medical record, reviewed, and no changes needed.   Review of Systems: No headache, visual changes, nausea, vomiting, diarrhea, constipation, dizziness, abdominal pain, skin rash, fevers, chills, night sweats, weight loss, swollen lymph nodes, body aches, joint swelling, muscle aches, chest pain, shortness of breath, mood changes, visual or auditory hallucinations.   Objective:   General: Well Developed, well nourished, and in no acute distress.  Neuro/Psych: Alert and oriented x3, extra-ocular muscles intact, able to move all 4 extremities, sensation grossly intact. Skin: Warm and dry, no rashes noted.  Respiratory: Not using accessory muscles, speaking in full sentences, trachea midline.  Cardiovascular: Pulses palpable, no extremity edema. Abdomen: Does not appear distended. Back Exam:  Inspection: Unremarkable, does walk in forward flexion of the spine  Motion: Flexion 45 deg, Extension 45 deg, Side  Bending to 45 deg bilaterally,  Rotation to 45 deg bilaterally  SLR laying: Negative  XSLR laying: Negative  Palpable tenderness: None. FABER: negative. Sensory change: Gross sensation intact to all lumbar and sacral dermatomes.  Reflexes: 2+ at both patellar tendons, 2+ at achilles tendons, Babinski's downgoing.  Strength at foot  Plantar-flexion: 5/5 Dorsi-flexion: 5/5 Eversion: 5/5 Inversion: 5/5  Leg strength  Quad: 5/5 Hamstring: 5/5 Hip flexor: 5/5 Hip abductors: 5/5  Gait unremarkable. Left Hip: ROM IR: 60 Deg, ER: 60 Deg, Flexion: 120 Deg, Extension: 100 Deg, Abduction: 45 Deg, Adduction: 45 Deg Strength IR: 5/5, ER: 5/5, Flexion: 5/5, Extension: 5/5, Abduction: 5/5, Adduction: 5/5 Pelvic alignment unremarkable to inspection and palpation. Standing hip rotation and gait without trendelenburg / unsteadiness. Greater trochanter without tenderness to palpation. No tenderness over piriformis. No SI joint tenderness and normal minimal SI movement.  CT scan for a renal stone in May 2016 shows multilevel lumbar degenerative disc disease with moderate central canal stenosis at L3-L4 and L4-L5 with corresponding facet arthritis at these levels. There is also mild to moderate hip arthritis bilaterally.  Impression and Recommendations:   This case required medical decision making of moderate complexity.

## 2015-05-02 ENCOUNTER — Encounter: Payer: Self-pay | Admitting: Physical Therapy

## 2015-05-02 ENCOUNTER — Ambulatory Visit: Payer: Medicare Other | Attending: Sports Medicine | Admitting: Physical Therapy

## 2015-05-02 DIAGNOSIS — M25552 Pain in left hip: Secondary | ICD-10-CM | POA: Insufficient documentation

## 2015-05-02 DIAGNOSIS — R293 Abnormal posture: Secondary | ICD-10-CM | POA: Insufficient documentation

## 2015-05-02 DIAGNOSIS — R29898 Other symptoms and signs involving the musculoskeletal system: Secondary | ICD-10-CM | POA: Diagnosis present

## 2015-05-02 NOTE — Therapy (Signed)
[facility] [address], [place], [place], [address] Phone: [phone]   Fax:  [phone]  Physical Therapy Evaluation  Patient Details  Name: Anthony Collins MRN: [id number] Date of Birth: October 28, 1945 Referring Provider: Dr. Aundria Mems  Encounter Date: 05/02/2015      PT End of Session - 05/02/15 1047    Visit Number 1   Number of Visits 10  Medicare   Date for PT Re-Evaluation 06/27/15   PT Start Time [id number]   PT Stop Time 1050   PT Time Calculation (min) 35 min   Activity Tolerance Patient tolerated treatment well   Behavior During Therapy [facility] for tasks assessed/performed      Past Medical History  Diagnosis Date  . Hypertension   . Bladder calculus   . History of kidney stones   . GERD (gastroesophageal reflux disease)   . Urgency of urination   . Wears glasses     Past Surgical History  Procedure Laterality Date  . Tonsillectomy  age 89  . Inguinal hernia repair Left 1998  (spinal anesthesia)  . Extracorporeal shock wave lithotripsy  2004    There were no vitals filed for this visit.  Visit Diagnosis:  Posture abnormality - Plan: PT plan of care cert/re-cert  Weakness of left hip - Plan: PT plan of care cert/re-cert  Hip pain, left - Plan: PT plan of care cert/re-cert      Subjective Assessment - 05/02/15 1019    Subjective Patient reports last december was doing alot of walking and standing.  Patient on left lumbar radiating down left leg.  Patient has difficulty standing upright.    How long can you sit comfortably? No difficulty   How long can you stand comfortably? pain and bent forward   How long can you walk comfortably? pain and bent forward   Patient Stated Goals be able to walk without pain   Currently in Pain? Yes   Pain Score 4    Pain Location Hip   Pain Orientation Left   Pain Descriptors / Indicators Dull;Throbbing   Pain Type Acute pain   Pain Radiating Towards  down left leg   Pain Onset More than a month ago   Pain Frequency Intermittent   Aggravating Factors  stepping on left leg, extending spine   Pain Relieving Factors laying on back   Multiple Pain Sites No            [facility] PT Assessment - 05/02/15 0001    Assessment   Medical Diagnosis M48.06 spinal stensosis of lumbar region   Referring Provider Dr. Aundria Mems   Onset Date/Surgical Date 02/22/15   Prior Therapy NOne   Precautions   Precautions None   Balance Screen   Has the patient fallen in the past 6 months No   Has the patient had a decrease in activity level because of a fear of falling?  No   Is the patient reluctant to leave their home because of a fear of falling?  No   Home Environment   Living Environment Private residence   Type of Home Other(Comment)  [place] to enter;Level entry   Entrance Stairs-Rails Can reach both   [place];Other (Comment)  elevator   Prior Function   Level of Independence Independent   Vocation Retired   Associate Professor   Overall Cognitive Status Within Functional Limits for tasks assessed   Observation/Other Assessments   Focus on  Therapeutic Outcomes (FOTO)  51% limitation CK  goal is 36% limitaiton CJ   Posture/Postural Control   Posture/Postural Control Postural limitations   Postural Limitations Forward head;Rounded Shoulders;Flexed trunk   Posture Comments unable to stand erect   ROM / Strength   AROM / PROM / Strength AROM;Strength   AROM   Lumbar Flexion full   Lumbar Extension to neutral, 100% limitation   Lumbar - Right Side Bend decreased by 50%   Lumbar - Left Side Bend decreased by 50%   Lumbar - Right Rotation full   Lumbar - Left Rotation full   Strength   Overall Strength Comments lumbar extension 3/5   Left Hip Extension 3/5   Left Hip ABduction 3/5   Flexibility   Soft Tissue Assessment /Muscle Length yes   Hamstrings tight bil.    Quadriceps tight bil.    Piriformis  bil. tight   Palpation   Patella mobility posteriorly   Spinal mobility decreased mobility of T6-L5   SI assessment  left ilium is rotated                            PT Education - 05/02/15 1046    Education provided Yes   Education Details prone on floor, sidebend to left   Person(s) Educated Patient   Methods Explanation;Demonstration;Verbal cues;Handout   Comprehension Returned demonstration;Verbalized understanding          PT Short Term Goals - 05/02/15 1053    PT SHORT TERM GOAL #1   Title independent with initial HEP   Time 4   Period Weeks   Status New   PT SHORT TERM GOAL #2   Title pain with walking decreased >/= 25%   Time 4   Period Weeks   Status New   PT SHORT TERM GOAL #3   Title pain with standing erect decreased >/= 25%   Time 4   Period Weeks   Status New           PT Long Term Goals - 05/02/15 1054    PT LONG TERM GOAL #1   Title independent with HEP and understand how to progress himself   Time 8   Period Weeks   Status New   PT LONG TERM GOAL #2   Title FOTO score </= 36% limitation   Time 8   Period Weeks   Status New   PT LONG TERM GOAL #3   Title lumbar extension full due to pain and mobility improved   Time 8   Period Weeks   Status New   PT LONG TERM GOAL #4   Title go up and down stairs with step over step pattern   Time 8   Period Weeks   Status New               Plan - 05/02/15 1048    Clinical Impression Statement Patient is a 70 year old male with diagnosis of lumbar stenosis that started 02/22/2015 when he had to walk alot.  FOTO score is 51% limitation.  Left hip pain and leg is 5/10 intermittently when he places weight on left leg or lumbar extension.  Lumbar ROM deficits: extension to neurtal and right sidebending decreased by 505.  Left hip abduction and extension 3/5.  Left ilium rotated posterioly.  Decreased mobility of T6-L5.  Tight psoas, hip flexor, hamstring and abdominal.  Patient  has to go up and down steps  with a step to step pattern.  Patient would benefit from  skilled therapy to reduce trunk pain, increase trunk extension, and return to walking and standing with minimal pain.    Pt will benefit from skilled therapeutic intervention in order to improve on the following deficits Pain;Difficulty walking;Decreased activity tolerance;Increased fascial restricitons;Decreased mobility;Decreased strength;Decreased range of motion   Rehab Potential Excellent   PT Frequency 2x / week   PT Duration 8 weeks   PT Treatment/Interventions Cryotherapy;Media planner;Therapeutic activities;Therapeutic exercise;Balance training;Patient/family education;Neuromuscular re-education;Manual techniques;Dry needling   PT Next Visit Plan mobilization to lumbar, correct pelvic, modalities as needed, dry needling to lumbar paraspinals, hip strength   PT Home Exercise Plan lumbar ROM exercises   Recommended Other Services None   Consulted and Agree with Plan of Care Patient          G-Codes - 05-12-2015 1056    Functional Assessment Tool Used FOTO score is 51% limitation  goal is 36% limitation   Functional Limitation Other PT primary   Other PT Primary Current Status [id number]) At least 40 percent but less than 60 percent impaired, limited or restricted   Other PT Primary Goal Status [id number]) At least 20 percent but less than 40 percent impaired, limited or restricted       Problem List Patient Active Problem List   Diagnosis Date Noted  . Spinal stenosis of lumbar region 04/27/2015    Earlie Counts, PT 05-12-2015 10:59 AM   [facility] [address] [place], [place], [address] Phone: [phone]   Fax:  [phone]  Name: Anthony Collins MRN: [id number] Date of Birth: [birth year]

## 2015-05-02 NOTE — Patient Instructions (Signed)
On Elbows (Prone)    Start laying flat. Rise up on elbows as high as possible, keeping hips on floor. Hold _60___ seconds. Repeat __1__ times per set. Do __1__ sets per session. Do _3___ sessions per day.  http://orth.exer.us/92   Copyright  VHI. All rights reserved.  Thoracolumbar Side-Bend: Double Arm (Standing)    Hands clasped, reach over head to left side until stretch is felt. Hold __10__ seconds. Relax.go to left Repeat __2__ times per set. Do __1__ sets per session. Do ___2_ sessions per day.  http://orth.exer.us/264   Copyright  VHI. All rights reserved.   [place] [address] [place], [place] [address] Phone # [phone] Fax [phone]

## 2015-05-03 ENCOUNTER — Institutional Professional Consult (permissible substitution): Payer: Medicare Other | Admitting: Sports Medicine

## 2015-05-04 ENCOUNTER — Encounter: Payer: Self-pay | Admitting: Physical Therapy

## 2015-05-04 ENCOUNTER — Ambulatory Visit: Payer: Medicare Other | Admitting: Physical Therapy

## 2015-05-04 DIAGNOSIS — M25552 Pain in left hip: Secondary | ICD-10-CM | POA: Diagnosis not present

## 2015-05-04 DIAGNOSIS — R293 Abnormal posture: Secondary | ICD-10-CM

## 2015-05-04 DIAGNOSIS — R29898 Other symptoms and signs involving the musculoskeletal system: Secondary | ICD-10-CM

## 2015-05-04 NOTE — Therapy (Signed)
[facility] [address], [place], [place], [address] Phone: [phone]   Fax:  [phone]  Physical Therapy Treatment  Patient Details  Name: BLAYNE DUBA MRN: [id number] Date of Birth: [birth year] Referring Provider: Dr. Aundria Mems  Encounter Date: 05/04/2015      PT End of Session - 05/04/15 0843    Visit Number 2   Number of Visits 10  Medicare   Date for PT Re-Evaluation 06/27/15   PT Start Time 0800   PT Stop Time 0843   PT Time Calculation (min) 43 min   Activity Tolerance Patient tolerated treatment well   Behavior During Therapy [facility] for tasks assessed/performed      Past Medical History  Diagnosis Date  . Hypertension   . Bladder calculus   . History of kidney stones   . GERD (gastroesophageal reflux disease)   . Urgency of urination   . Wears glasses     Past Surgical History  Procedure Laterality Date  . Tonsillectomy  age 30  . Inguinal hernia repair Left 1998  (spinal anesthesia)  . Extracorporeal shock wave lithotripsy  2004    There were no vitals filed for this visit.  Visit Diagnosis:  Posture abnormality  Weakness of left hip  Hip pain, left      Subjective Assessment - 05/04/15 0804    Subjective I feel a little better.    How long can you sit comfortably? No difficulty   How long can you stand comfortably? pain and bent forward   How long can you walk comfortably? pain and bent forward   Patient Stated Goals be able to walk without pain   Currently in Pain? Yes   Pain Score 4    Pain Location Hip   Pain Orientation Left   Pain Descriptors / Indicators Dull;Throbbing   Pain Frequency Intermittent   Aggravating Factors  stepping on left leg, extending spine   Pain Relieving Factors laying on back   Multiple Pain Sites No                         [facility] Adult PT Treatment/Exercise - 05/04/15 0001    Self-Care   Self-Care Other Self-Care  Comments   Other Self-Care Comments  educated patient on the spine and why lateral shift may help   Therapeutic Activites    Therapeutic Activities ADL's;Lifting   ADL's sitting posture, standing posture, going from sit to stand with erect spine   Lifting squat to lift and face object   Lumbar Exercises: Standing   Other Standing Lumbar Exercises standing lateral shift with right side against wall x2 min   Manual Therapy   Manual Therapy Soft tissue mobilization;Passive ROM;Joint mobilization   Joint Mobilization P-A mobilization  and rotational mobilization  grade 3   Soft tissue mobilization left quadratus   Passive ROM passively stretch left quads, left hip rotators                PT Education - 05/04/15 0842    Education provided Yes   Education Details lateral shift, body mechanics and posture awareness.    Person(s) Educated Patient   Methods Explanation;Demonstration;Verbal cues;Handout   Comprehension Returned demonstration;Verbalized understanding          PT Short Term Goals - 05/02/15 1053    PT SHORT TERM GOAL #1   Title independent with initial HEP   Time 4   Period Weeks  Status New   PT SHORT TERM GOAL #2   Title pain with walking decreased >/= 25%   Time 4   Period Weeks   Status New   PT SHORT TERM GOAL #3   Title pain with standing erect decreased >/= 25%   Time 4   Period Weeks   Status New           PT Long Term Goals - 05/02/15 1054    PT LONG TERM GOAL #1   Title independent with HEP and understand how to progress himself   Time 8   Period Weeks   Status New   PT LONG TERM GOAL #2   Title FOTO score </= 36% limitation   Time 8   Period Weeks   Status New   PT LONG TERM GOAL #3   Title lumbar extension full due to pain and mobility improved   Time 8   Period Weeks   Status New   PT LONG TERM GOAL #4   Title go up and down stairs with step over step pattern   Time 8   Period Weeks   Status New                Plan - 05/04/15 0843    Clinical Impression Statement Patient is a 70 year old male with diagnosis of lumbar stenosis.  Patient continues to stand with hips to left and flexed.  Patient is not as bent over as initial eval.  Patient will move with hips shifted to left and needed postural awareness to not push hip to left using a mirror.  Patient has tight left quads and responded well to stretches.  Patient has tight T6-L5 vertebrae.  Patient was able to stand erect after therapy.  Patietn will benefit from skilled therapy to reduce pain and improve posture.    Pt will benefit from skilled therapeutic intervention in order to improve on the following deficits Pain;Difficulty walking;Decreased activity tolerance;Increased fascial restricitons;Decreased mobility;Decreased strength;Decreased range of motion   Rehab Potential Excellent   Clinical Impairments Affecting Rehab Potential None   PT Frequency 2x / week   PT Duration 8 weeks   PT Treatment/Interventions Cryotherapy;Media planner;Therapeutic activities;Therapeutic exercise;Balance training;Patient/family education;Neuromuscular re-education;Manual techniques;Dry needling   PT Next Visit Plan mobilization to lumbar, correct pelvic, modalities as needed, dry needling to lumbar paraspinals, hip strength; back strength, start trunk extension   PT Home Exercise Plan progress as needed   Consulted and Agree with Plan of Care Patient        Problem List Patient Active Problem List   Diagnosis Date Noted  . Spinal stenosis of lumbar region 04/27/2015    Earlie Counts, PT 05/04/2015 8:47 AM   [facility] [address] [place], [place], [address] Phone: [phone]   Fax:  [phone]  Name: NIKIL MAYON MRN: [id number] Date of Birth: 07-01-1945

## 2015-05-04 NOTE — Patient Instructions (Addendum)
Wall Lean Stretch    With right elbow against wall, slowly stretch right hips toward wall,  left hand on  Left hip. Hold _2 minutes Relax. Repeat __1__ times per set. Every hour.  http://orth.exer.us/102   Copyright  VHI. All rights reserved.   Posture - Sitting    Sit upright, head facing forward. Try using a roll to support lower back. Keep shoulders relaxed, and avoid rounded back. Keep hips level with knees. Avoid crossing legs for long periods. Place a towel under the left hip.   Copyright  VHI. All rights reserved.  Stand to Sit / Sit to Stand    To sit: Bend knees to lower self onto front edge of chair, then scoot back on seat. To stand: Reverse sequence by placing one foot forward, and scoot to front of seat. Use rocking motion to stand up. Keep equal weight on both legs.  Bend at hips. Keep chest up.  Web space of hands into crease of hips.  Copyright  VHI. All rights reserved.  Posture - Standing    Good posture is important. Avoid slouching and forward head thrust. Maintain curve in low back and align ears over shoul- ders, hips over ankles.   Copyright  VHI. All rights reserved.  Getting Into / Out of Car    Lower self onto seat, scoot back, then bring in one leg at a time. Reverse sequence to get out.   Copyright  VHI. All rights reserved.  [facility] [address], [place], [place] [address] Phone # [phone] Fax [phone]

## 2015-05-07 ENCOUNTER — Ambulatory Visit: Payer: Medicare Other

## 2015-05-08 ENCOUNTER — Ambulatory Visit: Payer: Medicare Other

## 2015-05-08 DIAGNOSIS — M25552 Pain in left hip: Secondary | ICD-10-CM | POA: Diagnosis not present

## 2015-05-08 DIAGNOSIS — R29898 Other symptoms and signs involving the musculoskeletal system: Secondary | ICD-10-CM | POA: Diagnosis not present

## 2015-05-08 DIAGNOSIS — R293 Abnormal posture: Secondary | ICD-10-CM

## 2015-05-08 NOTE — Therapy (Signed)
[facility] [address] [place], [place], [address] Phone: [phone]   Fax:  [phone]  Physical Therapy Treatment  Patient Details  Name: Anthony Collins MRN: [id number] Date of Birth: 01-29-1946 Referring Provider: Dr. Aundria Mems  Encounter Date: 05/08/2015      PT End of Session - 05/08/15 0921    Visit Number 3   Number of Visits 10   Date for PT Re-Evaluation 06/27/15   PT Start Time 0838   PT Stop Time 0924   PT Time Calculation (min) 46 min   Activity Tolerance Patient tolerated treatment well   Behavior During Therapy [facility] for tasks assessed/performed      Past Medical History  Diagnosis Date  . Hypertension   . Bladder calculus   . History of kidney stones   . GERD (gastroesophageal reflux disease)   . Urgency of urination   . Wears glasses     Past Surgical History  Procedure Laterality Date  . Tonsillectomy  age 9  . Inguinal hernia repair Left 1998  (spinal anesthesia)  . Extracorporeal shock wave lithotripsy  2004    There were no vitals filed for this visit.  Visit Diagnosis:  Posture abnormality  Weakness of left hip  Hip pain, left      Subjective Assessment - 05/08/15 0841    Subjective Pt reports that he is more aware of his posture and mechanics.  Pt is making postural corrections.     Currently in Pain? No/denies                         [facility] Adult PT Treatment/Exercise - 05/08/15 0001    Exercises   Exercises Knee/Hip   Lumbar Exercises: Stretches   Press Ups 5 reps;10 seconds   Lumbar Exercises: Standing   Other Standing Lumbar Exercises standing lateral shift with right side against wall x2 min   Lumbar Exercises: Supine   Bridge 20 reps;5 seconds   Lumbar Exercises: Prone   Single Arm Raise Right;Left;20 reps  diffuculty with Lt arm   Straight Leg Raise 20 reps  tactile cues to reduce substitution   Knee/Hip Exercises:  Stretches   Hip Flexor Stretch Both;3 reps;20 seconds   Piriformis Stretch 20 seconds;3 reps   Knee/Hip Exercises: Aerobic   Nustep Level 2x 8 minutes  seat 7, arms 9   Manual Therapy   Manual Therapy Passive ROM   Soft tissue mobilization --   Passive ROM passive hip extension, IR/ER                PT Education - 05/08/15 0901    Education provided Yes   Education Details HEP: bridge, prone arm/leg lifts   Person(s) Educated Patient   Methods Explanation;Demonstration;Handout   Comprehension Verbalized understanding;Returned demonstration          PT Short Term Goals - 05/08/15 0844    PT SHORT TERM GOAL #1   Title independent with initial HEP   Status Achieved   PT SHORT TERM GOAL #2   Title pain with walking decreased >/= 25%   Status Achieved   PT SHORT TERM GOAL #3   Title pain with standing erect decreased >/= 25%   Status Achieved           PT Long Term Goals - 05/02/15 1054    PT LONG TERM GOAL #1   Title independent with HEP and understand how to progress himself  Time 8   Period Weeks   Status New   PT LONG TERM GOAL #2   Title FOTO score </= 36% limitation   Time 8   Period Weeks   Status New   PT LONG TERM GOAL #3   Title lumbar extension full due to pain and mobility improved   Time 8   Period Weeks   Status New   PT LONG TERM GOAL #4   Title go up and down stairs with step over step pattern   Time 8   Period Weeks   Status New               Plan - 05/08/15 0845    Clinical Impression Statement Pt reports 60-70% pain reduction with standing erect and with walking.  Pt is making body mechanics and postural modifications with ADLs and self-care tasks.  Pt with continued stiff hips and is doing HEP stretches for hip alignment and flexibility.  Pt with improved erect posture in standing.  Pt will continue to benefit from skilled PT for flexibility, hip strength and core strength.     Pt will benefit from skilled therapeutic  intervention in order to improve on the following deficits Pain;Difficulty walking;Decreased activity tolerance;Increased fascial restricitons;Decreased mobility;Decreased strength;Decreased range of motion   Rehab Potential Excellent   PT Frequency 2x / week   PT Duration 8 weeks   PT Treatment/Interventions Cryotherapy;Media planner;Therapeutic activities;Therapeutic exercise;Balance training;Patient/family education;Neuromuscular re-education;Manual techniques;Dry needling   PT Next Visit Plan Modalities as needed, dry needling to lumbar paraspinals if treated , hip strength; back strength, trunk and hip extension   Consulted and Agree with Plan of Care Patient        Problem List Patient Active Problem List   Diagnosis Date Noted  . Spinal stenosis of lumbar region 04/27/2015    Alawna Graybeal, PT 05/08/2015, 9:22 AM  [facility] [address], [place], [place], [address] Phone: [phone]   Fax:  [phone]  Name: Anthony Collins MRN: [id number] Date of Birth: 19-Sep-1945

## 2015-05-08 NOTE — Patient Instructions (Addendum)
  Straight Leg Raise (Prone)   Abdomen and head supported, keep left knee locked and raise leg at hip. Avoid arching low back. Repeat _10___ times per set. Do _1-2___ sets per session. Do _2___ sessions per day.      Straight Arm Lift: Prone   Arm straight out STRAIGHT Keeping thumb up, lift arm. Keep pelvis still. Do 10 __ times, 1-2 sets, __1_ times per day.   Bridging    Slowly raise buttocks from floor, keeping stomach tight.  Hold 5 seconds 10___ times per set. Do __2__ sets per session. Do _1-2___ sessions per day.  http://orth.exer.us/1096   Copyright  VHI. All rights reserved.    [facility] [address], [place], [place] [address] Phone # [phone] Fax [phone]

## 2015-05-10 ENCOUNTER — Ambulatory Visit: Payer: Medicare Other

## 2015-05-10 DIAGNOSIS — R29898 Other symptoms and signs involving the musculoskeletal system: Secondary | ICD-10-CM

## 2015-05-10 DIAGNOSIS — R293 Abnormal posture: Secondary | ICD-10-CM

## 2015-05-10 DIAGNOSIS — M25552 Pain in left hip: Secondary | ICD-10-CM

## 2015-05-10 NOTE — Patient Instructions (Signed)
Hip Flexor Stretch    Lying on back near edge of bed, bend one leg, foot flat. Hang other leg over edge, relaxed, thigh resting entirely on bed for 20 seconds. Repeat __3__ times. Do __2__ sessions per day. Advanced Exercise: Bend knee back keeping thigh in contact with bed.  http://gt2.exer.us/346   Copyright  VHI. All rights reserved.  [facility] [address] [place], [place] [address] Phone # [phone] Fax [phone]

## 2015-05-10 NOTE — Therapy (Signed)
[facility] [address] [place], [place], [address] Phone: [phone]   Fax:  [phone]  Physical Therapy Treatment  Patient Details  Name: Anthony Collins MRN: [id number] Date of Birth: 10/21/1945 Referring Provider: Dr. Aundria Mems  Encounter Date: 05/10/2015      PT End of Session - 05/10/15 0810    Visit Number 3   Number of Visits 10   Date for PT Re-Evaluation 06/27/15   PT Start Time 0729   PT Stop Time 0810   PT Time Calculation (min) 41 min   Activity Tolerance Patient tolerated treatment well   Behavior During Therapy [facility] for tasks assessed/performed      Past Medical History  Diagnosis Date  . Hypertension   . Bladder calculus   . History of kidney stones   . GERD (gastroesophageal reflux disease)   . Urgency of urination   . Wears glasses     Past Surgical History  Procedure Laterality Date  . Tonsillectomy  age 47  . Inguinal hernia repair Left 1998  (spinal anesthesia)  . Extracorporeal shock wave lithotripsy  2004    There were no vitals filed for this visit.  Visit Diagnosis:  Posture abnormality  Weakness of left hip  Hip pain, left      Subjective Assessment - 05/10/15 0729    Subjective Pt reports that he has been doing HEP.  Working on Clinical cytogeneticist.     Currently in Pain? No/denies                         [facility] Adult PT Treatment/Exercise - 05/10/15 0001    Lumbar Exercises: Stretches   Single Knee to Chest Stretch 3 reps;20 seconds   Lumbar Exercises: Supine   Bridge 20 reps;5 seconds   Lumbar Exercises: Prone   Single Arm Raise Right;Left;20 reps  diffuculty with Lt arm   Straight Leg Raise 20 reps  tactile cues to reduce substitution   Knee/Hip Exercises: Stretches   Hip Flexor Stretch Both;3 reps;20 seconds   Piriformis Stretch 20 seconds;3 reps   Knee/Hip Exercises: Aerobic   Nustep Level 2x 8 minutes  seat 7, arms 9    Manual Therapy   Manual Therapy Passive ROM   Passive ROM passive hip extension, IR/ER                PT Education - 05/10/15 0800    Education provided Yes   Education Details hip flexor stretch   Person(s) Educated Patient   Methods Explanation;Demonstration;Handout   Comprehension Verbalized understanding;Returned demonstration          PT Short Term Goals - 05/08/15 0844    PT SHORT TERM GOAL #1   Title independent with initial HEP   Status Achieved   PT SHORT TERM GOAL #2   Title pain with walking decreased >/= 25%   Status Achieved   PT SHORT TERM GOAL #3   Title pain with standing erect decreased >/= 25%   Status Achieved           PT Long Term Goals - 05/02/15 1054    PT LONG TERM GOAL #1   Title independent with HEP and understand how to progress himself   Time 8   Period Weeks   Status New   PT LONG TERM GOAL #2   Title FOTO score </= 36% limitation   Time 8   Period Weeks  Status New   PT LONG TERM GOAL #3   Title lumbar extension full due to pain and mobility improved   Time 8   Period Weeks   Status New   PT LONG TERM GOAL #4   Title go up and down stairs with step over step pattern   Time 8   Period Weeks   Status New               Plan - 05/10/15 0730    Clinical Impression Statement Pt reports 60-70% pain reduction with standing erect and with walking.  Pt is making body mechanics and postural modification with ADLs and self-care tasks.  Pt with cointinued hip AROM limitations and is doing HEP stretches for hip alignment and flexiblity.  Pt with weakness with prone strength exercises.  Pt with improved erect posture in standing.  Pt will contineu to benefit from skilled PT for flexiblity, hip strength and core strength.     Pt will benefit from skilled therapeutic intervention in order to improve on the following deficits Pain;Difficulty walking;Decreased activity tolerance;Increased fascial restricitons;Decreased  mobility;Decreased strength;Decreased range of motion   Rehab Potential Excellent   PT Frequency 2x / week   PT Duration 8 weeks   PT Treatment/Interventions Cryotherapy;Media planner;Therapeutic activities;Therapeutic exercise;Balance training;Patient/family education;Neuromuscular re-education;Manual techniques;Dry needling   PT Next Visit Plan Modalities as needed, dry needling to lumbar paraspinals if treated , hip strength; back strength, trunk and hip extension   Consulted and Agree with Plan of Care Patient        Problem List Patient Active Problem List   Diagnosis Date Noted  . Spinal stenosis of lumbar region 04/27/2015    Lashan Gluth, PT 05/10/2015, 8:12 AM  [facility] [address], [place], [place], [address] Phone: [phone]   Fax:  [phone]  Name: Anthony Collins MRN: [id number] Date of Birth: 13-Dec-1945

## 2015-05-14 ENCOUNTER — Encounter: Payer: Self-pay | Admitting: Physical Therapy

## 2015-05-14 ENCOUNTER — Ambulatory Visit: Payer: Medicare Other | Admitting: Physical Therapy

## 2015-05-14 DIAGNOSIS — R293 Abnormal posture: Secondary | ICD-10-CM | POA: Diagnosis not present

## 2015-05-14 DIAGNOSIS — R29898 Other symptoms and signs involving the musculoskeletal system: Secondary | ICD-10-CM | POA: Diagnosis not present

## 2015-05-14 DIAGNOSIS — M25552 Pain in left hip: Secondary | ICD-10-CM

## 2015-05-14 NOTE — Patient Instructions (Signed)
   Use a pool noodle or towel roll under your spine (across the back as shown).  In the lower thoracic spine lie flat on the noodle to mobilize the segment.  To take a break lift your upper trunk up using your hands to support your neck.  In the upper thoracic segments lift your buttocks up to mobilize the segment and come down to take a break.  Do the number of repetitions stated for each joint segment. 1-2 min 2 times per day.    Standing with feet about shoulder width apart and hands in the small of lower back.  Slowly bend backwards as you elongate your spine as far as possible without forcing the movement.   This is a movement not a stretch so go as far as possible but stay only 1-2 seconds maximum.   [facility] [address] [place], [place] [address] Phone # [phone] Fax [phone]

## 2015-05-14 NOTE — Therapy (Signed)
[facility] [address] [place], [place], [address] Phone: [phone]   Fax:  [phone]  Physical Therapy Treatment  Patient Details  Name: Anthony Collins MRN: [id number] Date of Birth: 01-07-1946 Referring Provider: Dr. Aundria Mems  Encounter Date: 05/14/2015      PT End of Session - 05/14/15 0841    Visit Number 4   Number of Visits 10  Medicare   Date for PT Re-Evaluation 06/27/15   Authorization Type KX modifier after 15   PT Start Time 0800   PT Stop Time 0840   PT Time Calculation (min) 40 min   Activity Tolerance Patient tolerated treatment well   Behavior During Therapy [facility] for tasks assessed/performed      Past Medical History  Diagnosis Date  . Hypertension   . Bladder calculus   . History of kidney stones   . GERD (gastroesophageal reflux disease)   . Urgency of urination   . Wears glasses     Past Surgical History  Procedure Laterality Date  . Tonsillectomy  age 51  . Inguinal hernia repair Left 1998  (spinal anesthesia)  . Extracorporeal shock wave lithotripsy  2004    There were no vitals filed for this visit.  Visit Diagnosis:  Posture abnormality  Weakness of left hip  Hip pain, left      Subjective Assessment - 05/14/15 0805    Subjective I have been doing my exercises.  Pain is always in left buttocks.  NO pain just stiffness and soreness.  I can now stand straight.    How long can you sit comfortably? hurts when sits in a sagging chair   How long can you stand comfortably? stand straight is better   How long can you walk comfortably? pain better by 50%   Patient Stated Goals be able to walk without pain   Currently in Pain? Yes   Pain Score 4    Pain Location Hip   Pain Orientation Left   Pain Descriptors / Indicators Aching   Pain Type Acute pain   Pain Onset More than a month ago   Pain Frequency Intermittent   Aggravating Factors  stepping on left  leg, extension spine   Pain Relieving Factors laying on back   Multiple Pain Sites No            [facility] PT Assessment - 05/14/15 0001    AROM   Lumbar Extension decreased by 75%                     [facility] Adult PT Treatment/Exercise - 05/14/15 0001    Lumbar Exercises: Standing   Other Standing Lumbar Exercises standing on wobble board keeping equal weight on legs eyes open 2 min, eyes closed 2 min  stands erect    Other Standing Lumbar Exercises step up on 4 inch step with left 20x   Lumbar Exercises: Supine   Bridge 20 reps;5 seconds   Lumbar Exercises: Prone   Single Arm Raise Right;Left;20 reps  diffuculty with Lt arm   Straight Leg Raise 1 second;20 reps  2 pillows under hips   Straight Leg Raises Limitations therapist assist   Knee/Hip Exercises: Supine   Other Supine Knee/Hip Exercises lay with roll horizontal on T6-8   Manual Therapy   Manual Therapy Joint mobilization   Joint Mobilization P-A mobilization  and rotational mobilization  grade 3 level T6-T10  PT Education - 05/14/15 0840    Education provided Yes   Education Details thoracic and lumbar extension   Person(s) Educated Patient   Methods Explanation;Demonstration;Handout   Comprehension Returned demonstration;Verbalized understanding          PT Short Term Goals - 05/08/15 0844    PT SHORT TERM GOAL #1   Title independent with initial HEP   Status Achieved   PT SHORT TERM GOAL #2   Title pain with walking decreased >/= 25%   Status Achieved   PT SHORT TERM GOAL #3   Title pain with standing erect decreased >/= 25%   Status Achieved           PT Long Term Goals - 05/14/15 0815    PT LONG TERM GOAL #1   Title independent with HEP and understand how to progress himself   Time 8   Period Weeks   Status On-going   PT LONG TERM GOAL #2   Title FOTO score </= 36% limitation   Time 8   Period Weeks   Status On-going   PT LONG TERM GOAL #3   Title  lumbar extension full due to pain and mobility improved   Time 8   Period Weeks   Status On-going  decreased by 75% and goes past neutral   PT LONG TERM GOAL #4   Title go up and down stairs with step over step pattern   Time 8   Period Weeks   Status Achieved               Plan - 05/14/15 5320    Clinical Impression Statement Patient continues to have reduction in pain.  Patient is able to stand erect and hips in neutra.  Patient is able to extend his spine past neutral.  Patient is now able to go up and down steps with step over step pattern.  Patient hsa met LTg #4.  Patient needs assistance for prone UE lift due to kyphotic posture. Patien twould benefit from skilled therapy to reduce pain and improve upright posture.    Pt will benefit from skilled therapeutic intervention in order to improve on the following deficits Pain;Difficulty walking;Decreased activity tolerance;Increased fascial restricitons;Decreased mobility;Decreased strength;Decreased range of motion   Rehab Potential Excellent   Clinical Impairments Affecting Rehab Potential None   PT Frequency 2x / week   PT Duration 8 weeks   PT Treatment/Interventions Cryotherapy;Media planner;Therapeutic activities;Therapeutic exercise;Balance training;Patient/family education;Neuromuscular re-education;Manual techniques;Dry needling   PT Next Visit Plan Modalities as needed, dry needling to lumbar paraspinals if treated , hip strength; back strength, trunk and hip extension   PT Home Exercise Plan progress as needed   Consulted and Agree with Plan of Care Patient        Problem List Patient Active Problem List   Diagnosis Date Noted  . Spinal stenosis of lumbar region 04/27/2015    Earlie Counts, PT 05/14/2015 8:46 AM   [facility] [address] [place], [place], [address] Phone: [phone]   Fax:   [phone]  Name: Anthony Collins MRN: [id number] Date of Birth: 1946/02/02

## 2015-05-17 ENCOUNTER — Ambulatory Visit: Payer: Medicare Other

## 2015-05-17 DIAGNOSIS — R29898 Other symptoms and signs involving the musculoskeletal system: Secondary | ICD-10-CM | POA: Diagnosis not present

## 2015-05-17 DIAGNOSIS — R293 Abnormal posture: Secondary | ICD-10-CM | POA: Diagnosis not present

## 2015-05-17 DIAGNOSIS — M25552 Pain in left hip: Secondary | ICD-10-CM

## 2015-05-17 NOTE — Therapy (Signed)
[facility] [address], [place], [place], [address] Phone: [phone]   Fax:  [phone]  Physical Therapy Treatment  Patient Details  Name: Anthony Collins MRN: [id number] Date of Birth: [birth year] Referring Provider: Dr. Aundria Mems  Encounter Date: 05/17/2015      PT End of Session - 05/17/15 0833    Visit Number 6   Number of Visits 10   Date for PT Re-Evaluation 06/27/15   Authorization Type KX modifier after 15   PT Start Time 0757   PT Stop Time 0838   PT Time Calculation (min) 41 min   Activity Tolerance Patient tolerated treatment well   Behavior During Therapy [facility] for tasks assessed/performed      Past Medical History  Diagnosis Date  . Hypertension   . Bladder calculus   . History of kidney stones   . GERD (gastroesophageal reflux disease)   . Urgency of urination   . Wears glasses     Past Surgical History  Procedure Laterality Date  . Tonsillectomy  age 70  . Inguinal hernia repair Left 1998  (spinal anesthesia)  . Extracorporeal shock wave lithotripsy  2004    There were no vitals filed for this visit.  Visit Diagnosis:  Posture abnormality  Weakness of left hip  Hip pain, left      Subjective Assessment - 05/17/15 0802    Subjective I have been doing my exercises. Pt reports 50-60% overall improvement since the start of care.     Currently in Pain? Yes   Pain Score 4    Pain Orientation Left   Pain Descriptors / Indicators Aching   Pain Type Acute pain   Pain Onset More than a month ago   Pain Frequency Intermittent   Aggravating Factors  standing on Lt leg   Pain Relieving Factors laying on back                         [facility] Adult PT Treatment/Exercise - 05/17/15 0001    Lumbar Exercises: Standing   Other Standing Lumbar Exercises walking in reverse with weight 25# 2x10 reps   Lumbar Exercises: Supine   Bridge 20 reps;5 seconds  with ball  squeeze   Knee/Hip Exercises: Stretches   Piriformis Stretch 20 seconds;3 reps   Knee/Hip Exercises: Aerobic   Elliptical Level 4, ramp 5 x 5 minutes  focus on erect posture and abdominal bracing   Nustep Level 2x 8 minutes  seat 7, arms 9   Manual Therapy   Manual Therapy Joint mobilization   Joint Mobilization P-A mobilization  and rotational mobilization  grade 3 level T6-T10   Passive ROM passive hip extension, IR/ER                  PT Short Term Goals - 05/08/15 0844    PT SHORT TERM GOAL #1   Title independent with initial HEP   Status Achieved   PT SHORT TERM GOAL #2   Title pain with walking decreased >/= 25%   Status Achieved   PT SHORT TERM GOAL #3   Title pain with standing erect decreased >/= 25%   Status Achieved           PT Long Term Goals - 05/14/15 0815    PT LONG TERM GOAL #1   Title independent with HEP and understand how to progress himself   Time 8   Period Weeks  Status On-going   PT LONG TERM GOAL #2   Title FOTO score </= 36% limitation   Time 8   Period Weeks   Status On-going   PT LONG TERM GOAL #3   Title lumbar extension full due to pain and mobility improved   Time 8   Period Weeks   Status On-going  decreased by 75% and goes past neutral   PT LONG TERM GOAL #4   Title go up and down stairs with step over step pattern   Time 8   Period Weeks   Status Achieved               Plan - 05/17/15 0806    Clinical Impression Statement Pt reports 50-60% overall pain reduction since the start of care.  Pt with improved standing posture and able to stand erect with hips and trunk in neutral.  Pt is now able to walk up and down steps with step-over-step gait.  Pt with continued weakness in core/lumbar extensors and Lt hip stiffness.  Pt will continue to benefit from skilled PT for core strength, hip strength, manual and flexibility.   Pt will benefit from skilled therapeutic intervention in order to improve on the following  deficits Pain;Difficulty walking;Decreased activity tolerance;Increased fascial restricitons;Decreased mobility;Decreased strength;Decreased range of motion   Rehab Potential Excellent   PT Frequency 2x / week   PT Duration 8 weeks   PT Treatment/Interventions Cryotherapy;Media planner;Therapeutic activities;Therapeutic exercise;Balance training;Patient/family education;Neuromuscular re-education;Manual techniques;Dry needling   PT Next Visit Plan Modalities as needed,  hip strength; back strength, trunk and hip extension   Consulted and Agree with Plan of Care Patient        Problem List Patient Active Problem List   Diagnosis Date Noted  . Spinal stenosis of lumbar region 04/27/2015    TAKACS,KELLY, PT 05/17/2015, 8:35 AM  [facility] [address] [place], [place], [address] Phone: [phone]   Fax:  [phone]  Name: CORDAI LITTERER MRN: [id number] Date of Birth: September 04, 1945

## 2015-05-21 ENCOUNTER — Ambulatory Visit: Payer: Medicare Other

## 2015-05-21 DIAGNOSIS — R29898 Other symptoms and signs involving the musculoskeletal system: Secondary | ICD-10-CM

## 2015-05-21 DIAGNOSIS — R293 Abnormal posture: Secondary | ICD-10-CM

## 2015-05-21 DIAGNOSIS — M25552 Pain in left hip: Secondary | ICD-10-CM

## 2015-05-21 NOTE — Therapy (Signed)
[facility] [address] [place], [place], [address] Phone: [phone]   Fax:  [phone]  Physical Therapy Treatment  Patient Details  Name: Anthony Collins MRN: [id number] Date of Birth: [birth year] Referring Provider: Dr. Aundria Mems  Encounter Date: 05/21/2015      PT End of Session - 05/21/15 1054    Visit Number 7   Number of Visits 10   Date for PT Re-Evaluation 06/27/15   Authorization Type KX modifier after 15   PT Start Time 1017   PT Stop Time 1101   PT Time Calculation (min) 44 min   Activity Tolerance Patient tolerated treatment well   Behavior During Therapy [facility] for tasks assessed/performed      Past Medical History  Diagnosis Date  . Hypertension   . Bladder calculus   . History of kidney stones   . GERD (gastroesophageal reflux disease)   . Urgency of urination   . Wears glasses     Past Surgical History  Procedure Laterality Date  . Tonsillectomy  age 70  . Inguinal hernia repair Left 1998  (spinal anesthesia)  . Extracorporeal shock wave lithotripsy  2004    There were no vitals filed for this visit.  Visit Diagnosis:  Posture abnormality  Weakness of left hip  Hip pain, left      Subjective Assessment - 05/21/15 1025    Subjective Weekend went well.     Currently in Pain? Yes   Pain Score 2    Pain Location Hip   Pain Orientation Left   Pain Descriptors / Indicators Aching   Pain Type Acute pain   Pain Onset More than a month ago   Pain Frequency Intermittent   Aggravating Factors  standing on Lt leg, morning hours   Pain Relieving Factors stretching, rest                         [facility] Adult PT Treatment/Exercise - 05/21/15 0001    Lumbar Exercises: Stretches   Single Knee to Chest Stretch 3 reps;20 seconds   Lumbar Exercises: Standing   Other Standing Lumbar Exercises walking in reverse with weight 25# 2x10 reps   Lumbar Exercises:  Supine   Bridge 20 reps;5 seconds  with ball squeeze   Knee/Hip Exercises: Stretches   Active Hamstring Stretch 3 reps;20 seconds   Hip Flexor Stretch Both;3 reps;20 seconds   Piriformis Stretch 20 seconds;3 reps   Knee/Hip Exercises: Aerobic   Elliptical Level 4, ramp 5 x 5 minutes  focus on erect posture and abdominal bracing   Nustep Level 2x 8 minutes  seat 7, arms 9   Manual Therapy   Manual Therapy Joint mobilization   Passive ROM passive hip extension, IR/ER                  PT Short Term Goals - 05/08/15 0844    PT SHORT TERM GOAL #1   Title independent with initial HEP   Status Achieved   PT SHORT TERM GOAL #2   Title pain with walking decreased >/= 25%   Status Achieved   PT SHORT TERM GOAL #3   Title pain with standing erect decreased >/= 25%   Status Achieved           PT Long Term Goals - 05/21/15 1027    PT LONG TERM GOAL #1   Title independent with HEP and understand how to progress himself  Time 8   Period Weeks   Status On-going   PT LONG TERM GOAL #4   Title go up and down stairs with step over step pattern   Status Achieved               Plan - 05/21/15 1028    Clinical Impression Statement Pt reports 70-70% overall pain reeuction since the start of care.  Pt with improved standing posture and is able to stand with hips and trunk in neutral.  Pt is now able to walk up and down steps with step-over-step gait.  Pt with continued wakness in core/lumbar extensors and Lt hip stiffness. Pt will continue to benefit from skilled PT for core strength, hip strength, manual and flexibility exercises.   Pt will benefit from skilled therapeutic intervention in order to improve on the following deficits Pain;Difficulty walking;Decreased activity tolerance;Increased fascial restricitons;Decreased mobility;Decreased strength;Decreased range of motion   Rehab Potential Excellent   PT Frequency 2x / week   PT Duration 8 weeks   PT  Treatment/Interventions Cryotherapy;Media planner;Therapeutic activities;Therapeutic exercise;Balance training;Patient/family education;Neuromuscular re-education;Manual techniques;Dry needling   PT Next Visit Plan Manual as needed,  hip strength; back strength, trunk and hip extension   Consulted and Agree with Plan of Care Patient        Problem List Patient Active Problem List   Diagnosis Date Noted  . Spinal stenosis of lumbar region 04/27/2015    Desa Rech, PT 05/21/2015, 10:56 AM  [facility] [address], [place], [place], [address] Phone: [phone]   Fax:  [phone]  Name: MARKEES HORNBOSTEL MRN: [id number] Date of Birth: [birth year]

## 2015-05-24 ENCOUNTER — Ambulatory Visit: Payer: Medicare Other | Attending: Sports Medicine

## 2015-05-24 DIAGNOSIS — M25552 Pain in left hip: Secondary | ICD-10-CM | POA: Diagnosis present

## 2015-05-24 DIAGNOSIS — R29898 Other symptoms and signs involving the musculoskeletal system: Secondary | ICD-10-CM | POA: Insufficient documentation

## 2015-05-24 DIAGNOSIS — R293 Abnormal posture: Secondary | ICD-10-CM | POA: Diagnosis not present

## 2015-05-24 NOTE — Therapy (Signed)
[facility] [address] [place], [place], [address] Phone: [phone]   Fax:  [phone]  Physical Therapy Treatment  Patient Details  Name: Anthony Collins MRN: [id number] Date of Birth: 1945-06-15 Referring Provider: Dr. Aundria Mems  Encounter Date: 05/24/2015      PT End of Session - 05/24/15 0840    Visit Number 8   Number of Visits 10   Date for PT Re-Evaluation 06/27/15   Authorization Type KX modifier after 15   PT Start Time 0757   PT Stop Time 0845   PT Time Calculation (min) 48 min   Activity Tolerance Patient tolerated treatment well   Behavior During Therapy [facility] for tasks assessed/performed      Past Medical History  Diagnosis Date  . Hypertension   . Bladder calculus   . History of kidney stones   . GERD (gastroesophageal reflux disease)   . Urgency of urination   . Wears glasses     Past Surgical History  Procedure Laterality Date  . Tonsillectomy  age 28  . Inguinal hernia repair Left 1998  (spinal anesthesia)  . Extracorporeal shock wave lithotripsy  2004    There were no vitals filed for this visit.  Visit Diagnosis:  Posture abnormality  Weakness of left hip  Hip pain, left                       [facility] Adult PT Treatment/Exercise - 05/24/15 0001    Lumbar Exercises: Stretches   Single Knee to Chest Stretch 3 reps;20 seconds   Lumbar Exercises: Standing   Other Standing Lumbar Exercises walking in reverse with weight 25# 2x10 reps   Lumbar Exercises: Supine   Bridge 20 reps;5 seconds  with ball squeeze   Knee/Hip Exercises: Stretches   Active Hamstring Stretch 3 reps;20 seconds   Hip Flexor Stretch Both;3 reps;20 seconds   Piriformis Stretch 20 seconds;3 reps   Knee/Hip Exercises: Aerobic   Elliptical Level 4, ramp 5 x 5 minutes  focus on erect posture and abdominal bracing   Nustep Level 2x 10 minutes  seat 7, arms 9   Manual Therapy   Manual Therapy Joint mobilization   Joint Mobilization P-A mobilization  and rotational mobilization  grade 3 level T6-T10   Passive ROM passive hip extension, IR/ER                  PT Short Term Goals - 05/08/15 0844    PT SHORT TERM GOAL #1   Title independent with initial HEP   Status Achieved   PT SHORT TERM GOAL #2   Title pain with walking decreased >/= 25%   Status Achieved   PT SHORT TERM GOAL #3   Title pain with standing erect decreased >/= 25%   Status Achieved           PT Long Term Goals - 05/21/15 1027    PT LONG TERM GOAL #1   Title independent with HEP and understand how to progress himself   Time 8   Period Weeks   Status On-going   PT LONG TERM GOAL #4   Title go up and down stairs with step over step pattern   Status Achieved               Plan - 05/24/15 0801    Clinical Impression Statement Pt reports 70-80% overall pain reduction since the start of care.  Pt with  improved standing posture and is able t ostand with hips and trunk in neutral.  Pt is now able to walk up and down steps with step-over-step gait.  Pt with continued weakness in core/lumbar extensors, reduced PA mobility and Lt hip stiffness.Pt has current HEP in place for core strength and flexibility.  Pt will continue to benefit from skilled PT for core strength, hip strength, manual and flexibilty.   Pt will benefit from skilled therapeutic intervention in order to improve on the following deficits Pain;Difficulty walking;Decreased activity tolerance;Increased fascial restricitons;Decreased mobility;Decreased strength;Decreased range of motion   Rehab Potential Excellent   PT Frequency 2x / week   PT Duration 8 weeks   PT Treatment/Interventions Cryotherapy;Media planner;Therapeutic activities;Therapeutic exercise;Balance training;Patient/family education;Neuromuscular re-education;Manual techniques;Dry needling   PT Next Visit  Plan Manual as needed,  hip strength; back strength, trunk and hip extension   Consulted and Agree with Plan of Care Patient        Problem List Patient Active Problem List   Diagnosis Date Noted  . Spinal stenosis of lumbar region 04/27/2015    Jaella Weinert, PT 05/24/2015, 8:41 AM  [facility] [address] [place], [place], [address] Phone: [phone]   Fax:  [phone]  Name: Anthony Collins MRN: [id number] Date of Birth: [birth year]

## 2015-05-25 ENCOUNTER — Ambulatory Visit (INDEPENDENT_AMBULATORY_CARE_PROVIDER_SITE_OTHER): Payer: Medicare Other | Admitting: Sports Medicine

## 2015-05-25 ENCOUNTER — Encounter: Payer: Self-pay | Admitting: Sports Medicine

## 2015-05-25 VITALS — BP 157/75 | HR 77 | Wt 164.0 lb

## 2015-05-25 DIAGNOSIS — M48061 Spinal stenosis, lumbar region without neurogenic claudication: Secondary | ICD-10-CM

## 2015-05-25 DIAGNOSIS — M4806 Spinal stenosis, lumbar region: Secondary | ICD-10-CM | POA: Diagnosis not present

## 2015-05-25 MED ORDER — GABAPENTIN 100 MG PO CAPS
ORAL_CAPSULE | ORAL | Status: DC
Start: 2015-05-25 — End: 2016-06-09

## 2015-05-25 MED ORDER — MELOXICAM 15 MG PO TABS: ORAL_TABLET | ORAL | Status: DC

## 2015-05-25 NOTE — Assessment & Plan Note (Signed)
Although Anthony Collins does have bilateral hip osteoarthritis it is not his pain generator, his pain generator is his moderate lumbar spinal stenosis at L3-L4 and L4-L5 levels with concurrent facet arthritis at these levels. Pain is now completely resolved with physical therapy, gabapentin, meloxicam. Continue rehabilitation exercises at home and return to see me on an as-needed basis.

## 2015-05-25 NOTE — Progress Notes (Signed)
  Subjective:    CC: Follow-up  HPI: Returns for follow-up of his lumbar spinal stenosis with neurogenic claudication, he has responded very favorably to physical therapy, steroids, gabapentin. Rarely takes meloxicam now and notes essentially 100% relief of his pain.  Past medical history, Surgical history, Family history not pertinant except as noted below, Social history, Allergies, and medications have been entered into the medical record, reviewed, and no changes needed.   Review of Systems: No fevers, chills, night sweats, weight loss, chest pain, or shortness of breath.   Objective:    General: Well Developed, well nourished, and in no acute distress.  Neuro: Alert and oriented x3, extra-ocular muscles intact, sensation grossly intact.  HEENT: Normocephalic, atraumatic, pupils equal round reactive to light, neck supple, no masses, no lymphadenopathy, thyroid nonpalpable.  Skin: Warm and dry, no rashes. Cardiac: Regular rate and rhythm, no murmurs rubs or gallops, no lower extremity edema.  Respiratory: Clear to auscultation bilaterally. Not using accessory muscles, speaking in full sentences.  Impression and Recommendations:

## 2015-05-28 DIAGNOSIS — Z125 Encounter for screening for malignant neoplasm of prostate: Secondary | ICD-10-CM | POA: Diagnosis not present

## 2015-05-29 ENCOUNTER — Ambulatory Visit: Payer: Medicare Other

## 2015-05-29 DIAGNOSIS — M25552 Pain in left hip: Secondary | ICD-10-CM | POA: Diagnosis not present

## 2015-05-29 DIAGNOSIS — R29898 Other symptoms and signs involving the musculoskeletal system: Secondary | ICD-10-CM

## 2015-05-29 DIAGNOSIS — R293 Abnormal posture: Secondary | ICD-10-CM | POA: Diagnosis not present

## 2015-05-29 NOTE — Therapy (Signed)
[facility] [address] [place], [place], [address] Phone: [phone]   Fax:  [phone]  Physical Therapy Treatment  Patient Details  Name: Anthony Collins MRN: [id number] Date of Birth: [birth year] Referring Provider: Dr. Aundria Mems  Encounter Date: 05/29/2015      PT End of Session - 05/29/15 0919    Visit Number 9   Number of Visits 10   Date for PT Re-Evaluation 06/27/15   PT Start Time 0840   PT Stop Time 0924   PT Time Calculation (min) 44 min   Activity Tolerance Patient tolerated treatment well   Behavior During Therapy [facility] for tasks assessed/performed      Past Medical History  Diagnosis Date  . Hypertension   . Bladder calculus   . History of kidney stones   . GERD (gastroesophageal reflux disease)   . Urgency of urination   . Wears glasses     Past Surgical History  Procedure Laterality Date  . Tonsillectomy  age 11  . Inguinal hernia repair Left 1998  (spinal anesthesia)  . Extracorporeal shock wave lithotripsy  2004    There were no vitals filed for this visit.  Visit Diagnosis:  Posture abnormality  Weakness of left hip  Hip pain, left      Subjective Assessment - 05/29/15 0848    Subjective Pt feels good.   Saw MD and he was pleased with progress.     Patient Stated Goals be able to walk without pain   Currently in Pain? Yes   Pain Score 0-No pain   Pain Location Hip   Pain Orientation Left   Pain Descriptors / Indicators Aching   Pain Type Acute pain   Pain Onset More than a month ago   Pain Frequency Intermittent   Aggravating Factors  standing on Lt leg, morning hours   Pain Relieving Factors stretching, rest            [facility] PT Assessment - 05/29/15 0001    Observation/Other Assessments   Focus on Therapeutic Outcomes (FOTO)  28% limitation                     [facility] Adult PT Treatment/Exercise - 05/29/15 0001    Lumbar Exercises:  Stretches   Single Knee to Chest Stretch 3 reps;20 seconds   Lumbar Exercises: Standing   Other Standing Lumbar Exercises walking in reverse with weight 25# 2x10 reps   Lumbar Exercises: Supine   Bridge 20 reps;5 seconds  with ball squeeze   Knee/Hip Exercises: Stretches   Active Hamstring Stretch 3 reps;20 seconds   Hip Flexor Stretch Both;3 reps;20 seconds   Piriformis Stretch 20 seconds;3 reps   Knee/Hip Exercises: Aerobic   Elliptical Level 4, ramp 5 x 5 minutes  focus on erect posture and abdominal bracing   Nustep Level 2x 10 minutes  seat 7, arms 9   Manual Therapy   Manual Therapy Joint mobilization   Passive ROM passive hip extension, IR/ER                  PT Short Term Goals - 05/08/15 0844    PT SHORT TERM GOAL #1   Title independent with initial HEP   Status Achieved   PT SHORT TERM GOAL #2   Title pain with walking decreased >/= 25%   Status Achieved   PT SHORT TERM GOAL #3   Title pain with standing erect decreased >/= 25%  Status Achieved           PT Long Term Goals - 05/21/15 1027    PT LONG TERM GOAL #1   Title independent with HEP and understand how to progress himself   Time 8   Period Weeks   Status On-going   PT LONG TERM GOAL #4   Title go up and down stairs with step over step pattern   Status Achieved               Plan - 05/29/15 0851    Clinical Impression Statement Pt reports 70-80% overall pain reduction since the start of care. FOTO is improved to 28% limitation. Pt with improved standing posture and is able to stand with hips and trunk in neutral.  Pt with continued core/lumbar extensor weakness, reduced PA mobility and Lt hip stiffness.  Pt has current HEP in place for core strength and flexiblity.  Pt will benefit from one more PT session to finalize HEP.     Pt will benefit from skilled therapeutic intervention in order to improve on the following deficits Pain;Difficulty walking;Decreased activity  tolerance;Increased fascial restricitons;Decreased mobility;Decreased strength;Decreased range of motion   Rehab Potential Excellent   PT Frequency 2x / week   PT Duration 8 weeks   PT Treatment/Interventions Cryotherapy;Media planner;Therapeutic activities;Therapeutic exercise;Balance training;Patient/family education;Neuromuscular re-education;Manual techniques;Dry needling   PT Next Visit Plan Manual as needed,  hip strength; back strength, trunk and hip extension. G-codes and D/C next session   Consulted and Agree with Plan of Care Patient        Problem List Patient Active Problem List   Diagnosis Date Noted  . Spinal stenosis of lumbar region 04/27/2015    TAKACS,KELLY, PT 05/29/2015, 9:20 AM  [facility] [address] [place], [place], [address] Phone: [phone]   Fax:  [phone]  Name: FERRY HOTTLE MRN: [id number] Date of Birth: 01-06-1946

## 2015-05-31 ENCOUNTER — Ambulatory Visit: Payer: Medicare Other

## 2015-05-31 DIAGNOSIS — R293 Abnormal posture: Secondary | ICD-10-CM

## 2015-05-31 DIAGNOSIS — M25552 Pain in left hip: Secondary | ICD-10-CM

## 2015-05-31 DIAGNOSIS — R29898 Other symptoms and signs involving the musculoskeletal system: Secondary | ICD-10-CM | POA: Diagnosis not present

## 2015-05-31 NOTE — Therapy (Signed)
[facility] [address] [place], [place], [address] Phone: [phone]   Fax:  [phone]  Physical Therapy Treatment  Patient Details  Name: Anthony Collins MRN: [id number] Date of Birth: 1946/03/11 Referring Provider: Dr. Aundria Mems  Encounter Date: 05/31/2015      Anthony Collins End of Session - 05/31/15 0823    Visit Number 10   Anthony Collins Start Time 0746   Anthony Collins Stop Time 0831   Anthony Collins Time Calculation (min) 45 min   Activity Tolerance Patient tolerated treatment well   Behavior During Therapy [facility] for tasks assessed/performed      Past Medical History  Diagnosis Date  . Hypertension   . Bladder calculus   . History of kidney stones   . GERD (gastroesophageal reflux disease)   . Urgency of urination   . Wears glasses     Past Surgical History  Procedure Laterality Date  . Tonsillectomy  age 89  . Inguinal hernia repair Left 1998  (spinal anesthesia)  . Extracorporeal shock wave lithotripsy  2004    There were no vitals filed for this visit.  Visit Diagnosis:  Posture abnormality  Weakness of left hip  Hip pain, left      Subjective Assessment - 05/31/15 0759    Subjective Ready for D/C.  80% overall improvement since the start of care.   Patient Stated Goals be able to walk without pain   Currently in Pain? No/denies   Pain Score 0-No pain            [facility] Anthony Collins Assessment - 05/31/15 0001    Assessment   Medical Diagnosis M48.06 spinal stensosis of lumbar region   Onset Date/Surgical Date 02/22/15   Cognition   Overall Cognitive Status Within Functional Limits for tasks assessed   Observation/Other Assessments   Focus on Therapeutic Outcomes (FOTO)  28% limitation   AROM   Lumbar Flexion full   Lumbar Extension decreased by 75%    Lumbar - Right Side Bend decreased by 50%   Lumbar - Left Side Bend decreased by 50%   Strength   Left Hip Extension 4+/5   Left Hip ABduction 4/5                      [facility] Adult Anthony Collins Treatment/Exercise - 05/31/15 0001    Lumbar Exercises: Stretches   Single Knee to Chest Stretch 3 reps;20 seconds   Lumbar Exercises: Standing   Other Standing Lumbar Exercises walking in reverse with weight 25# 2x10 reps   Lumbar Exercises: Supine   Bridge 20 reps;5 seconds  with ball squeeze   Lumbar Exercises: Prone   Single Arm Raise Right;Left;20 reps  diffuculty with Lt arm   Straight Leg Raise 1 second;20 reps   Knee/Hip Exercises: Stretches   Active Hamstring Stretch 3 reps;20 seconds   Hip Flexor Stretch Both;3 reps;20 seconds   Piriformis Stretch 20 seconds;3 reps   Knee/Hip Exercises: Aerobic   Elliptical Level 4, ramp 5 x 5 minutes  focus on erect posture and abdominal bracing   Nustep Level 2x 10 minutes  seat 7, arms 9   Manual Therapy   Manual Therapy Joint mobilization   Passive ROM passive hip extension, IR/ER                  Anthony Collins Short Term Goals - 05/08/15 0844    Anthony Collins SHORT TERM GOAL #1   Title independent with initial HEP   Status  Achieved   Anthony Collins SHORT TERM GOAL #2   Title pain with walking decreased >/= 25%   Status Achieved   Anthony Collins SHORT TERM GOAL #3   Title pain with standing erect decreased >/= 25%   Status Achieved           Anthony Collins Long Term Goals - 06-26-2015 0801    Anthony Collins LONG TERM GOAL #1   Title independent with HEP and understand how to progress himself   Status Achieved   Anthony Collins LONG TERM GOAL #2   Title FOTO score </= 36% limitation   Status Achieved   Anthony Collins LONG TERM GOAL #3   Title lumbar extension full due to pain and mobility improved   Status On-going  limited by 75%- no pain   Anthony Collins LONG TERM GOAL #4   Title go up and down stairs with step over step pattern   Status Achieved               Plan - 06-26-2015 0807    Clinical Impression Statement Anthony Collins is ready for D/C to HEP and transition to the gym.  Lt hip strength has improved since evaluation.  Lumbar AROM remains limited but  without pain.  Anthony Collins with improved posture and is able to stand with hips and trunk in neutral.  Anthony Collins reports 80% overall improvement since the start of care.     Anthony Collins Next Visit Plan D/C Anthony Collins to HEP   Consulted and Agree with Plan of Care Patient          G-Codes - June 26, 2015 0808    Functional Assessment Tool Used FOTO: 28% limitation   Functional Limitation Other Anthony Collins primary   Other Anthony Collins Primary Goal Status (C7893) At least 20 percent but less than 40 percent impaired, limited or restricted   Other Anthony Collins Primary Discharge Status (Y1017) At least 20 percent but less than 40 percent impaired, limited or restricted      Problem List Patient Active Problem List   Diagnosis Date Noted  . Spinal stenosis of lumbar region 04/27/2015   PHYSICAL THERAPY DISCHARGE SUMMARY  Visits from Start of Care: 10  Current functional level related to goals / functional outcomes:Anthony Collins is 80% better with Anthony Collins.  Will continue with HEP and gym exercises.     Remaining deficits: Lt hip>Rt hip stiffness and reduced PA mobility of the spine.  Anthony Collins has HEP in place.     Education / Equipment: HEP, posture, body mechanics  Plan: Patient agrees to discharge.  Patient goals were met. Patient is being discharged due to meeting the stated rehab goals.  ?????     Anthony Collins, Anthony Collins Jun 26, 2015 8:25 AM  [facility] [address], [place], [place], [address] Phone: [phone]   Fax:  [phone]  Name: Anthony Collins MRN: [id number] Date of Birth: July 19, 1945

## 2015-06-07 DIAGNOSIS — Z1211 Encounter for screening for malignant neoplasm of colon: Secondary | ICD-10-CM | POA: Diagnosis not present

## 2015-06-11 ENCOUNTER — Encounter: Payer: Self-pay | Admitting: Sports Medicine

## 2015-06-11 ENCOUNTER — Telehealth: Payer: Self-pay

## 2015-06-11 ENCOUNTER — Ambulatory Visit (INDEPENDENT_AMBULATORY_CARE_PROVIDER_SITE_OTHER): Payer: Medicare Other | Admitting: Sports Medicine

## 2015-06-11 DIAGNOSIS — M4806 Spinal stenosis, lumbar region: Secondary | ICD-10-CM | POA: Diagnosis not present

## 2015-06-11 DIAGNOSIS — M48061 Spinal stenosis, lumbar region without neurogenic claudication: Secondary | ICD-10-CM

## 2015-06-11 MED ORDER — KETOROLAC TROMETHAMINE 30 MG/ML IJ SOLN
30.0000 mg | Freq: Once | INTRAMUSCULAR | Status: AC
  Administered 2015-06-11: 30 mg via INTRAMUSCULAR

## 2015-06-11 MED ORDER — METHYLPREDNISOLONE SODIUM SUCC 125 MG IJ SOLR
125.0000 mg | Freq: Once | INTRAMUSCULAR | Status: AC
  Administered 2015-06-11: 125 mg via INTRAMUSCULAR

## 2015-06-11 MED ORDER — TRAMADOL HCL 50 MG PO TABS: ORAL_TABLET | ORAL | Status: DC

## 2015-06-11 MED ORDER — PREDNISONE 10 MG (21) PO TBPK: ORAL_TABLET | ORAL | Status: DC

## 2015-06-11 NOTE — Assessment & Plan Note (Signed)
Was completely controlled with meloxicam, gabapentin and post physical therapy, unfortunately overdid it in the gym and is having recurrence of pain. Toradol 30, Solu-Medrol 125. Tramadol for breakthrough pain, adding a prednisone taper. Return in 2-3 weeks, MRI for interventional planning if no better. Of note he does have moderate lumbar spinal stenosis at L3-L4 and L4-L5 with facet arthritis at these levels as well. He has hip osteoarthritis but it is not his pain generator.

## 2015-06-11 NOTE — Telephone Encounter (Signed)
12 day please

## 2015-06-11 NOTE — Progress Notes (Signed)
  Subjective:    CC: Recurrent pain  HPI: This is a pleasant 70 year old male with known lumbar spinal stenosis, initially he did well with steroids, NSAIDs, gabapentin as well as physical therapy and was pain-free. Unfortunately he overdid in the gym and is having recurrence of pain, moderate, persistent with radiation down both legs and worsened with standing straight. He has to walk hunched over. No bowel or bladder dysfunction or saddle numbness, no constitutional symptoms.  Past medical history, Surgical history, Family history not pertinant except as noted below, Social history, Allergies, and medications have been entered into the medical record, reviewed, and no changes needed.   Review of Systems: No fevers, chills, night sweats, weight loss, chest pain, or shortness of breath.   Objective:    General: Well Developed, well nourished, and in no acute distress.  Neuro: Alert and oriented x3, extra-ocular muscles intact, sensation grossly intact.  HEENT: Normocephalic, atraumatic, pupils equal round reactive to light, neck supple, no masses, no lymphadenopathy, thyroid nonpalpable.  Skin: Warm and dry, no rashes. Cardiac: Regular rate and rhythm, no murmurs rubs or gallops, no lower extremity edema.  Respiratory: Clear to auscultation bilaterally. Not using accessory muscles, speaking in full sentences.  Impression and Recommendations:    I spent 25 minutes with this patient, greater than 50% was face-to-face time counseling regarding the above diagnoses

## 2015-06-11 NOTE — Telephone Encounter (Signed)
Spoke with Romish at the pharmacy.

## 2015-06-22 DIAGNOSIS — M545 Low back pain: Secondary | ICD-10-CM | POA: Diagnosis not present

## 2015-06-25 ENCOUNTER — Other Ambulatory Visit: Payer: Self-pay | Admitting: Orthopaedic Surgery

## 2015-06-25 DIAGNOSIS — M48061 Spinal stenosis, lumbar region without neurogenic claudication: Secondary | ICD-10-CM

## 2015-06-26 ENCOUNTER — Ambulatory Visit: Payer: Medicare Other | Admitting: Sports Medicine

## 2015-07-02 ENCOUNTER — Ambulatory Visit
Admission: RE | Admit: 2015-07-02 | Discharge: 2015-07-02 | Disposition: A | Discharge status: Home / Self Care | Payer: Medicare Other | Source: Ambulatory Visit | Attending: Orthopaedic Surgery | Admitting: Orthopaedic Surgery

## 2015-07-02 DIAGNOSIS — M48061 Spinal stenosis, lumbar region without neurogenic claudication: Secondary | ICD-10-CM

## 2015-07-02 DIAGNOSIS — M4806 Spinal stenosis, lumbar region: Secondary | ICD-10-CM | POA: Diagnosis not present

## 2015-07-04 DIAGNOSIS — M545 Low back pain: Secondary | ICD-10-CM | POA: Diagnosis not present

## 2015-07-25 DIAGNOSIS — H5213 Myopia, bilateral: Secondary | ICD-10-CM | POA: Diagnosis not present

## 2015-07-25 DIAGNOSIS — H2513 Age-related nuclear cataract, bilateral: Secondary | ICD-10-CM | POA: Diagnosis not present

## 2015-08-02 DIAGNOSIS — H2512 Age-related nuclear cataract, left eye: Secondary | ICD-10-CM | POA: Diagnosis not present

## 2015-08-02 DIAGNOSIS — H25812 Combined forms of age-related cataract, left eye: Secondary | ICD-10-CM | POA: Diagnosis not present

## 2015-08-07 DIAGNOSIS — M545 Low back pain: Secondary | ICD-10-CM | POA: Diagnosis not present

## 2015-08-23 DIAGNOSIS — H25811 Combined forms of age-related cataract, right eye: Secondary | ICD-10-CM | POA: Diagnosis not present

## 2015-08-23 DIAGNOSIS — H2511 Age-related nuclear cataract, right eye: Secondary | ICD-10-CM | POA: Diagnosis not present

## 2015-09-18 DIAGNOSIS — M545 Low back pain: Secondary | ICD-10-CM | POA: Diagnosis not present

## 2015-10-04 DIAGNOSIS — D2261 Melanocytic nevi of right upper limb, including shoulder: Secondary | ICD-10-CM | POA: Diagnosis not present

## 2015-10-04 DIAGNOSIS — L308 Other specified dermatitis: Secondary | ICD-10-CM | POA: Diagnosis not present

## 2015-10-04 DIAGNOSIS — D2372 Other benign neoplasm of skin of left lower limb, including hip: Secondary | ICD-10-CM | POA: Diagnosis not present

## 2015-10-04 DIAGNOSIS — L570 Actinic keratosis: Secondary | ICD-10-CM | POA: Diagnosis not present

## 2015-10-04 DIAGNOSIS — L821 Other seborrheic keratosis: Secondary | ICD-10-CM | POA: Diagnosis not present

## 2015-10-04 DIAGNOSIS — D2272 Melanocytic nevi of left lower limb, including hip: Secondary | ICD-10-CM | POA: Diagnosis not present

## 2015-10-04 DIAGNOSIS — D1801 Hemangioma of skin and subcutaneous tissue: Secondary | ICD-10-CM | POA: Diagnosis not present

## 2015-10-04 DIAGNOSIS — D225 Melanocytic nevi of trunk: Secondary | ICD-10-CM | POA: Diagnosis not present

## 2015-11-16 ENCOUNTER — Other Ambulatory Visit: Payer: Self-pay

## 2016-01-21 DIAGNOSIS — R351 Nocturia: Secondary | ICD-10-CM | POA: Diagnosis not present

## 2016-01-21 DIAGNOSIS — N401 Enlarged prostate with lower urinary tract symptoms: Secondary | ICD-10-CM | POA: Diagnosis not present

## 2016-01-21 DIAGNOSIS — N200 Calculus of kidney: Secondary | ICD-10-CM | POA: Diagnosis not present

## 2016-01-24 DIAGNOSIS — Z23 Encounter for immunization: Secondary | ICD-10-CM | POA: Diagnosis not present

## 2016-04-01 DIAGNOSIS — H02403 Unspecified ptosis of bilateral eyelids: Secondary | ICD-10-CM | POA: Diagnosis not present

## 2016-05-08 DIAGNOSIS — H02831 Dermatochalasis of right upper eyelid: Secondary | ICD-10-CM | POA: Diagnosis not present

## 2016-05-08 DIAGNOSIS — H53453 Other localized visual field defect, bilateral: Secondary | ICD-10-CM | POA: Diagnosis not present

## 2016-05-08 DIAGNOSIS — H02403 Unspecified ptosis of bilateral eyelids: Secondary | ICD-10-CM | POA: Diagnosis not present

## 2016-05-08 DIAGNOSIS — H02401 Unspecified ptosis of right eyelid: Secondary | ICD-10-CM | POA: Diagnosis not present

## 2016-05-08 DIAGNOSIS — H02402 Unspecified ptosis of left eyelid: Secondary | ICD-10-CM | POA: Diagnosis not present

## 2016-05-08 DIAGNOSIS — H02834 Dermatochalasis of left upper eyelid: Secondary | ICD-10-CM | POA: Diagnosis not present

## 2016-06-09 ENCOUNTER — Encounter: Payer: Self-pay | Admitting: Family

## 2016-06-09 ENCOUNTER — Ambulatory Visit (INDEPENDENT_AMBULATORY_CARE_PROVIDER_SITE_OTHER): Payer: Medicare Other | Admitting: Family

## 2016-06-09 VITALS — BP 170/98 | HR 67 | Temp 97.9°F | Resp 16 | Ht 68.0 in | Wt 164.0 lb

## 2016-06-09 DIAGNOSIS — I10 Essential (primary) hypertension: Secondary | ICD-10-CM | POA: Diagnosis not present

## 2016-06-09 DIAGNOSIS — R350 Frequency of micturition: Secondary | ICD-10-CM | POA: Insufficient documentation

## 2016-06-09 DIAGNOSIS — K219 Gastro-esophageal reflux disease without esophagitis: Secondary | ICD-10-CM | POA: Diagnosis not present

## 2016-06-09 MED ORDER — METOPROLOL SUCCINATE ER 25 MG PO TB24: 25.0000 mg | ORAL_TABLET | Freq: Every evening | ORAL | 0 refills | Status: DC

## 2016-06-09 MED ORDER — TAMSULOSIN HCL 0.4 MG PO CAPS: 0.4000 mg | ORAL_CAPSULE | Freq: Every day | ORAL | 0 refills | Status: DC

## 2016-06-09 MED ORDER — IRBESARTAN 300 MG PO TABS: 300.0000 mg | ORAL_TABLET | Freq: Every morning | ORAL | 0 refills | Status: DC

## 2016-06-09 MED ORDER — OMEPRAZOLE 40 MG PO CPDR: 40.0000 mg | DELAYED_RELEASE_CAPSULE | Freq: Every day | ORAL | 2 refills | Status: DC | PRN

## 2016-06-09 NOTE — Assessment & Plan Note (Signed)
Blood pressure elevated today above goal 150/90 with current regimen and no adverse side effects. Denies worse headache of life with no new symptoms of end organ damage and physical exam. Blood pressures at home appear more adequately controlled. Patient will return with blood pressure cuff for correlation to determine validity of home readings. Continue current dosage of metoprolol and irbesartan. Recommend following a low-sodium diet. Continue to monitor.

## 2016-06-09 NOTE — Patient Instructions (Signed)
Thank you for choosing [facility].  SUMMARY AND INSTRUCTIONS:  Please continue to take her medications as prescribed  Schedule a time for your wellness exam at your convenience.  Continue to monitor blood pressure at home and record the values. Take blood pressure at different times throughout the day. Bring your blood pressure cuff to your next office visit.   Work on following a low-sodium diet.  Please check on the following medications: Oxybutynin, Myrbetriq, darifenacin   Medication:   Your prescription(s) have been submitted to your pharmacy or been printed and provided for you. Please take as directed and contact our office if you believe you are having problem(s) with the medication(s) or have any questions.  Follow up:  If your symptoms worsen or fail to improve, please contact our office for further instruction, or in case of emergency go directly to the emergency room at the closest medical facility.    DASH Eating Plan DASH stands for "Dietary Approaches to Stop Hypertension." The DASH eating plan is a healthy eating plan that has been shown to reduce high blood pressure (hypertension). It may also reduce your risk for type 2 diabetes, heart disease, and stroke. The DASH eating plan may also help with weight loss. What are tips for following this plan? General guidelines   Avoid eating more than 2,300 mg (milligrams) of salt (sodium) a day. If you have hypertension, you may need to reduce your sodium intake to 1,500 mg a day.  Limit alcohol intake to no more than 1 drink a day for nonpregnant women and 2 drinks a day for men. One drink equals 12 oz of beer, 5 oz of wine, or 1 oz of hard liquor.  Work with your health care provider to maintain a healthy body weight or to lose weight. Ask what an ideal weight is for you.  Get at least 30 minutes of exercise that causes your heart to beat faster (aerobic exercise) most days of the week. Activities may include  walking, swimming, or biking.  Work with your health care provider or diet and nutrition specialist (dietitian) to adjust your eating plan to your individual calorie needs. Reading food labels   Check food labels for the amount of sodium per serving. Choose foods with less than 5 percent of the Daily Value of sodium. Generally, foods with less than 300 mg of sodium per serving fit into this eating plan.  To find whole grains, look for the word "whole" as the first word in the ingredient list. Shopping   Buy products labeled as "low-sodium" or "no salt added."  Buy fresh foods. Avoid canned foods and premade or frozen meals. Cooking   Avoid adding salt when cooking. Use salt-free seasonings or herbs instead of table salt or sea salt. Check with your health care provider or pharmacist before using salt substitutes.  Do not fry foods. Cook foods using healthy methods such as baking, boiling, grilling, and broiling instead.  Cook with heart-healthy oils, such as olive, canola, soybean, or sunflower oil. Meal planning    Eat a balanced diet that includes:  5 or more servings of fruits and vegetables each day. At each meal, try to fill half of your plate with fruits and vegetables.  Up to 6-8 servings of whole grains each day.  Less than 6 oz of lean meat, poultry, or fish each day. A 3-oz serving of meat is about the same size as a deck of cards. One egg equals 1 oz.  2  servings of low-fat dairy each day.  A serving of nuts, seeds, or beans 5 times each week.  Heart-healthy fats. Healthy fats called Omega-3 fatty acids are found in foods such as flaxseeds and coldwater fish, like sardines, salmon, and mackerel.  Limit how much you eat of the following:  Canned or prepackaged foods.  Food that is high in trans fat, such as fried foods.  Food that is high in saturated fat, such as fatty meat.  Sweets, desserts, sugary drinks, and other foods with added sugar.  Full-fat dairy  products.  Do not salt foods before eating.  Try to eat at least 2 vegetarian meals each week.  Eat more home-cooked food and less restaurant, buffet, and fast food.  When eating at a restaurant, ask that your food be prepared with less salt or no salt, if possible. What foods are recommended? The items listed may not be a complete list. Talk with your dietitian about what dietary choices are best for you. Grains  Whole-grain or whole-wheat bread. Whole-grain or whole-wheat pasta. Brown rice. [place]. Bulgur. Whole-grain and low-sodium cereals. Pita bread. Low-fat, low-sodium crackers. Whole-wheat flour tortillas. Vegetables  Fresh or frozen vegetables (raw, steamed, roasted, or grilled). Low-sodium or reduced-sodium tomato and vegetable juice. Low-sodium or reduced-sodium tomato sauce and tomato paste. Low-sodium or reduced-sodium canned vegetables. Fruits  All fresh, dried, or frozen fruit. Canned fruit in natural juice (without added sugar). Meat and other protein foods  Skinless chicken or [place]. Ground chicken or [place]. Pork with fat trimmed off. Fish and seafood. Egg whites. Dried beans, peas, or lentils. Unsalted nuts, nut butters, and seeds. Unsalted canned beans. Lean cuts of beef with fat trimmed off. Low-sodium, lean deli meat. Dairy  Low-fat (1%) or fat-free (skim) milk. Fat-free, low-fat, or reduced-fat cheeses. Nonfat, low-sodium ricotta or cottage cheese. Low-fat or nonfat yogurt. Low-fat, low-sodium cheese. Fats and oils  Soft margarine without trans fats. Vegetable oil. Low-fat, reduced-fat, or light mayonnaise and salad dressings (reduced-sodium). Canola, safflower, olive, soybean, and sunflower oils. Avocado. Seasoning and other foods  Herbs. Spices. Seasoning mixes without salt. Unsalted popcorn and pretzels. Fat-free sweets. What foods are not recommended? The items listed may not be a complete list. Talk with your dietitian about what dietary choices are best  for you. Grains  Baked goods made with fat, such as croissants, muffins, or some breads. Dry pasta or rice meal packs. Vegetables  Creamed or fried vegetables. Vegetables in a cheese sauce. Regular canned vegetables (not low-sodium or reduced-sodium). Regular canned tomato sauce and paste (not low-sodium or reduced-sodium). Regular tomato and vegetable juice (not low-sodium or reduced-sodium). Angie Fava. Olives. Fruits  Canned fruit in a light or heavy syrup. Fried fruit. Fruit in cream or butter sauce. Meat and other protein foods  Fatty cuts of meat. Ribs. Fried meat. Berniece Salines. Sausage. Bologna and other processed lunch meats. Salami. Fatback. Hotdogs. Bratwurst. Salted nuts and seeds. Canned beans with added salt. Canned or smoked fish. Whole eggs or egg yolks. Chicken or [place] with skin. Dairy  Whole or 2% milk, cream, and half-and-half. Whole or full-fat cream cheese. Whole-fat or sweetened yogurt. Full-fat cheese. Nondairy creamers. Whipped toppings. Processed cheese and cheese spreads. Fats and oils  Butter. Stick margarine. Lard. Shortening. Ghee. Bacon fat. Tropical oils, such as coconut, palm kernel, or palm oil. Seasoning and other foods  Salted popcorn and pretzels. Onion salt, garlic salt, seasoned salt, table salt, and sea salt. Worcestershire sauce. Tartar sauce. Barbecue sauce. Teriyaki sauce. Soy sauce, including  reduced-sodium. Steak sauce. Canned and packaged gravies. Fish sauce. Oyster sauce. Cocktail sauce. Horseradish that you find on the shelf. Ketchup. Mustard. Meat flavorings and tenderizers. Bouillon cubes. Hot sauce and Tabasco sauce. Premade or packaged marinades. Premade or packaged taco seasonings. Relishes. Regular salad dressings. Where to find more information:  National Heart, Lung, and [place]: https://wilson-eaton.com/  American Heart Association: www.heart.org Summary  The DASH eating plan is a healthy eating plan that has been shown to reduce high blood  pressure (hypertension). It may also reduce your risk for type 2 diabetes, heart disease, and stroke.  With the DASH eating plan, you should limit salt (sodium) intake to 2,300 mg a day. If you have hypertension, you may need to reduce your sodium intake to 1,500 mg a day.  When on the DASH eating plan, aim to eat more fresh fruits and vegetables, whole grains, lean proteins, low-fat dairy, and heart-healthy fats.  Work with your health care provider or diet and nutrition specialist (dietitian) to adjust your eating plan to your individual calorie needs. This information is not intended to replace advice given to you by your health care provider. Make sure you discuss any questions you have with your health care provider. Document Released: 02/27/2011 Document Revised: 03/03/2016 Document Reviewed: 03/03/2016 Elsevier Interactive Patient Education  2017 [employer]   Overactive Bladder, Adult Overactive bladder is a group of urinary symptoms. With overactive bladder, you may suddenly feel the need to pass urine (urinate) right away. After feeling this sudden urge, you might also leak urine if you cannot get to the bathroom fast enough (urinary incontinence). These symptoms might interfere with your daily work or social activities. Overactive bladder symptoms may also wake you up at night. Overactive bladder affects the nerve signals between your bladder and your brain. Your bladder may get the signal to empty before it is full. Very sensitive muscles can also make your bladder squeeze too soon. What are the causes? Many things can cause an overactive bladder. Possible causes include:  Urinary tract infection.  Infection of nearby tissues, such as the prostate.  Prostate enlargement.  Being pregnant with twins or more (multiples).  Surgery on the uterus or urethra.  Bladder stones, inflammation, or tumors.  Drinking too much caffeine or alcohol.  Certain medicines, especially those  that you take to help your body get rid of extra fluid (diuretics) by increasing urine production.  Muscle or nerve weakness, especially from:  A spinal cord injury.  Stroke.  Multiple sclerosis.  Parkinson disease.  Diabetes. This can cause a high urine volume that fills the bladder so quickly that the normal urge to urinate is triggered very strongly.  Constipation. A buildup of too much stool can put pressure on your bladder. What increases the risk? You may be at greater risk for overactive bladder if you:  Are an older adult.  Smoke.  Are going through menopause.  Have prostate problems.  Have a neurological disease, such as stroke, dementia, Parkinson disease, or multiple sclerosis (MS).  Eat or drink things that irritate the bladder. These include alcohol, spicy food, and caffeine.  Are overweight or obese. What are the signs or symptoms? The signs and symptoms of an overactive bladder include:  Sudden, strong urges to urinate.  Leaking urine.  Urinating eight or more times per day.  Waking up to urinate two or more times per night. How is this diagnosed? Your health care provider may suspect overactive bladder based on your symptoms. The health  care provider will do a physical exam and take your medical history. Blood or urine tests may also be done. For example, you might need to have a bladder function test to check how well you can hold your urine. You might also need to see a health care provider who specializes in the urinary tract (urologist). How is this treated? Treatment for overactive bladder depends on the cause of your condition and whether it is mild or severe. Certain treatments can be done in your health care provider's office or clinic. You can also make lifestyle changes at home. Options include: Behavioral Treatments   Biofeedback. A specialist uses sensors to help you become aware of your body's signals.  Keeping a daily log of when you need  to urinate and what happens after the urge. This may help you manage your condition.  Bladder training. This helps you learn to control the urge to urinate by following a schedule that directs you to urinate at regular intervals (timed voiding). At first, you might have to wait a few minutes after feeling the urge. In time, you should be able to schedule bathroom visits an hour or more apart.  Kegel exercises. These are exercises to strengthen the pelvic floor muscles, which support the bladder. Toning these muscles can help you control urination, even if your bladder muscles are overactive. A specialist will teach you how to do these exercises correctly. They require daily practice.  Weight loss. If you are obese or overweight, losing weight might relieve your symptoms of overactive bladder. Talk to your health care provider about losing weight and whether there is a specific program or method that would work best for you.  Diet change. This might help if constipation is making your overactive bladder worse. Your health care provider or a dietitian can explain ways to change what you eat to ease constipation. You might also need to consume less alcohol and caffeine or drink other fluids at different times of the day.  Stopping smoking.  Wearing pads to absorb leakage while you wait for other treatments to take effect. Physical Treatments   Electrical stimulation. Electrodes send gentle pulses of electricity to strengthen the nerves or muscles that help to control the bladder. Sometimes, the electrodes are placed outside of the body. In other cases, they might be placed inside the body (implanted). This treatment can take several months to have an effect.  Supportive devices. Women may need a plastic device that fits into the vagina and supports the bladder (pessary). Medicines  Several medicines can help treat overactive bladder and are usually used along with other treatments. Some are injected  into the muscles involved in urination. Others come in pill form. Your health care provider may prescribe:  Antispasmodics. These medicines block the signals that the nerves send to the bladder. This keeps the bladder from releasing urine at the wrong time.  Tricyclic antidepressants. These types of antidepressants also relax bladder muscles. Surgery   You may have a device implanted to help manage the nerve signals that indicate when you need to urinate.  You may have surgery to implant electrodes for electrical stimulation.  Sometimes, very severe cases of overactive bladder require surgery to change the shape of the bladder. Follow these instructions at home:  Take medicines only as directed by your health care provider.  Use any implants or a pessary as directed by your health care provider.  Make any diet or lifestyle changes that are recommended by your health care  provider. These might include:  Drinking less fluid or drinking at different times of the day. If you need to urinate often during the night, you may need to stop drinking fluids early in the evening.  Cutting down on caffeine or alcohol. Both can make an overactive bladder worse. Caffeine is found in coffee, tea, and sodas.  Doing Kegel exercises to strengthen muscles.  Losing weight if you need to.  Eating a healthy and balanced diet to prevent constipation.  Keep a journal or log to track how much and when you drink and also when you feel the need to urinate. This will help your health care provider to monitor your condition. Contact a health care provider if:  Your symptoms do not get better after treatment.  Your pain and discomfort are getting worse.  You have more frequent urges to urinate.  You have a fever. Get help right away if: You are not able to control your bladder at all. This information is not intended to replace advice given to you by your health care provider. Make sure you discuss any  questions you have with your health care provider. Document Released: 01/04/2009 Document Revised: 08/16/2015 Document Reviewed: 08/03/2013 Elsevier Interactive Patient Education  2017 [employer].

## 2016-06-09 NOTE — Progress Notes (Signed)
Subjective:    Patient ID: Anthony Collins, male    DOB: 24-Sep-1945, 71 y.o.   MRN: [id number]  Chief Complaint  Patient presents with  . Establish Care    concerned about BP, states that he takes both BP meds everyday and it is starting to creep back up    HPI:  Anthony Collins is a 71 y.o. male who  has a past medical history of Bladder calculus; GERD (gastroesophageal reflux disease); History of kidney stones; Hypertension; Spinal stenosis; Urgency of urination; and Wears glasses. and presents today for an office visit to establish care.  1.) Hypertension - Currently maintained on irbasartan and metoprolol. Reports taking the medications as prescribed and denies adverse side effects or hypotensive readings. Blood pressures at home have been increasing to the range of 130-140's / 80-90s. Denies changes in vision, worst headache of life or new symptoms of end organ damage. Working on following a low sodium diet. Physical limited is limited, but is currently walking about 1 mile at a time.   BP Readings from Last 3 Encounters:  06/09/16 (!) 170/98  06/11/15 (!) 167/91  05/25/15 (!) 157/75    2.) GERD - Currently maintained on omeprazole. Reports taking the medication as needed and as prescribed. No adverse side effects. Symptoms are generally well controlled with the current medication as needed.    3.) Prostate issues - This is a new problem. Associated symptoms of urinary frequency and nocturia about 3 times per night has been going on for several months and is currently maintained on tamsulosin. Reports taking the medication as prescribed and denies adverse side effects. Timing of the symptoms is generally consistent throughout the day and night. Denies fever, chills, flank pain or dysuria. No difficulty starting a stream or change in stream strength. Indicates most recent PSA by urology was normal.    No Known Allergies    Outpatient Medications Prior to Visit  Medication  Sig Dispense Refill  . aspirin EC 81 MG tablet Take 81 mg by mouth daily.    . calcium carbonate (TUMS - DOSED IN MG ELEMENTAL CALCIUM) 500 MG chewable tablet Chew 1 tablet by mouth 3 (three) times daily as needed for indigestion or heartburn. Reported on 05/02/2015    . Cholecalciferol (VITAMIN D3) 1000 UNITS CAPS Take 1 capsule by mouth daily.    . Multiple Vitamin (MULTIVITAMIN WITH MINERALS) TABS tablet Take 1 tablet by mouth daily.    . vitamin B-12 (CYANOCOBALAMIN) 500 MCG tablet Take 500 mcg by mouth daily.    . irbesartan (AVAPRO) 300 MG tablet Take 300 mg by mouth every morning.     . metoprolol succinate (TOPROL-XL) 25 MG 24 hr tablet Take 25 mg by mouth every evening.     Marland Kitchen omeprazole (PRILOSEC) 40 MG capsule Take 40 mg by mouth daily as needed (heart burn).   3  . tamsulosin (FLOMAX) 0.4 MG CAPS capsule Take 1 capsule (0.4 mg total) by mouth daily. 5 capsule 0  . gabapentin (NEURONTIN) 100 MG capsule One tab PO BID 180 capsule 3  . meloxicam (MOBIC) 15 MG tablet One tab PO qAM with breakfast for 2 weeks, then daily prn pain. 90 tablet 3  . predniSONE (STERAPRED UNI-PAK 21 TAB) 10 MG (21) TBPK tablet 12 day taper pack, use as directed 1 tablet 0  . traMADol (ULTRAM) 50 MG tablet 1-2 tabs by mouth Q8 hours, maximum 6 tabs per day. 40 tablet 0   No facility-administered medications prior  to visit.      Past Medical History:  Diagnosis Date  . Bladder calculus   . GERD (gastroesophageal reflux disease)   . History of kidney stones   . Hypertension   . Spinal stenosis   . Urgency of urination   . Wears glasses       Past Surgical History:  Procedure Laterality Date  . BLEPHAROPLASTY    . EXTRACORPOREAL SHOCK WAVE LITHOTRIPSY  2004  . EYE SURGERY    . INGUINAL HERNIA REPAIR Left 1998  (spinal anesthesia)  . TONSILLECTOMY  age 45      Family History  Problem Relation Age of Onset  . Breast cancer Mother   . Hypertension Mother   . Lung cancer Father       Social  History   Social History  . Marital status: Married    Spouse name: N/A  . Number of children: N/A  . Years of education: 63   Occupational History  . Retired     Social History Main Topics  . Smoking status: Never Smoker  . Smokeless tobacco: Never Used  . Alcohol use 8.4 oz/week    14 Glasses of wine per week     Comment: 2 wine daily  . Drug use: No  . Sexual activity: Not on file   Other Topics Concern  . Not on file   Social History Narrative   Fun/Hobby: Exercise, Baseball card collector, 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    Review of Systems  Constitutional: Negative for chills and fever.  Eyes:       Negative for changes in vision  Respiratory: Negative for cough, chest tightness and wheezing.   Cardiovascular: Negative for chest pain, palpitations and leg swelling.  Gastrointestinal: Negative for abdominal distention, abdominal pain, constipation, diarrhea, nausea and vomiting.  Genitourinary: Positive for frequency. Negative for discharge, dysuria, flank pain, hematuria, penile pain and urgency.  Neurological: Negative for dizziness, weakness and light-headedness.       Objective:    BP (!) 170/98 (BP Location: Left Arm, Patient Position: Sitting, Cuff Size: Normal)   Pulse 67   Temp 97.9 F (36.6 C) (Oral)   Resp 16   Ht 5\' 8"  (1.727 m)   Wt 164 lb (74.4 kg)   SpO2 96%   BMI 24.94 kg/m  Nursing note and vital signs reviewed.  Physical Exam  Constitutional: He is oriented to person, place, and time. He appears well-developed and well-nourished. No distress.  Cardiovascular: Normal rate, regular rhythm, normal heart sounds and intact distal pulses.   Pulmonary/Chest: Effort normal and breath sounds normal.  Abdominal: Normal appearance and bowel sounds are normal. He exhibits no mass. There is no hepatosplenomegaly. There is no tenderness. There is no rigidity, no rebound, no guarding, no tenderness at McBurney's point and negative Murphy's sign.  Neurological: He  is alert and oriented to person, place, and time.  Skin: Skin is warm and dry.  Psychiatric: He has a normal mood and affect. His behavior is normal. Judgment and thought content normal.        Assessment & Plan:   Problem List Items Addressed This Visit      Cardiovascular and Mediastinum   Essential hypertension - Primary    Blood pressure elevated today above goal 150/90 with current regimen and no adverse side effects. Denies worse headache of life with no new symptoms of end organ damage and physical exam. Blood pressures at home appear more adequately controlled. Patient will return with blood  pressure cuff for correlation to determine validity of home readings. Continue current dosage of metoprolol and irbesartan. Recommend following a low-sodium diet. Continue to monitor.      Relevant Medications   metoprolol succinate (TOPROL-XL) 25 MG 24 hr tablet   irbesartan (AVAPRO) 300 MG tablet     Digestive   Gastroesophageal reflux disease without esophagitis    Gastroesophageal reflux appears adequately controlled with current medication regimen and no adverse side effects. Continue current dosage of omeprazole. Continue to monitor.      Relevant Medications   omeprazole (PRILOSEC) 40 MG capsule     Other   Urinary frequency    Urinary frequency with current medication regimen with no evidence of prostatitis or infection. There is concern for overactive bladder versus BPH. Continue current dosage of tamsulosin. Consider starting medication for OAB. Encouraged to decrease fluid intake closer to bed and decrease caffeine as able. Continue to monitor pending trial of addition of medication.       Relevant Medications   tamsulosin (FLOMAX) 0.4 MG CAPS capsule       I have discontinued Mr. Steele's meloxicam, gabapentin, traMADol, and predniSONE. I have also changed his omeprazole, metoprolol succinate, and irbesartan. Additionally, I am having him maintain his calcium carbonate,  multivitamin with minerals, aspirin EC, Vitamin D3, vitamin B-12, and tamsulosin.   Meds ordered this encounter  Medications  . omeprazole (PRILOSEC) 40 MG capsule    Sig: Take 1 capsule (40 mg total) by mouth daily as needed (heart burn).    Dispense:  30 capsule    Refill:  2    Order Specific Question:   Supervising Provider    Answer:   Pricilla Holm A [[id number]]  . metoprolol succinate (TOPROL-XL) 25 MG 24 hr tablet    Sig: Take 1 tablet (25 mg total) by mouth every evening.    Dispense:  90 tablet    Refill:  0    Order Specific Question:   Supervising Provider    Answer:   Pricilla Holm A [[id number]]  . irbesartan (AVAPRO) 300 MG tablet    Sig: Take 1 tablet (300 mg total) by mouth every morning.    Dispense:  90 tablet    Refill:  0    Order Specific Question:   Supervising Provider    Answer:   Pricilla Holm A [[id number]]  . tamsulosin (FLOMAX) 0.4 MG CAPS capsule    Sig: Take 1 capsule (0.4 mg total) by mouth daily.    Dispense:  90 capsule    Refill:  0    Order Specific Question:   Supervising Provider    Answer:   Pricilla Holm A [[id number]]     Follow-up: Return in about 1 month (around 07/10/2016), or if symptoms worsen or fail to improve.  Mauricio Po, FNP

## 2016-06-09 NOTE — Assessment & Plan Note (Addendum)
Gastroesophageal reflux appears adequately controlled with current medication regimen and no adverse side effects. Continue current dosage of omeprazole. Continue to monitor.

## 2016-06-09 NOTE — Assessment & Plan Note (Signed)
Urinary frequency with current medication regimen with no evidence of prostatitis or infection. There is concern for overactive bladder versus BPH. Continue current dosage of tamsulosin. Consider starting medication for OAB. Encouraged to decrease fluid intake closer to bed and decrease caffeine as able. Continue to monitor pending trial of addition of medication.

## 2016-06-10 MED ORDER — OXYBUTYNIN CHLORIDE 5 MG PO TABS: 5.0000 mg | ORAL_TABLET | Freq: Two times a day (BID) | ORAL | 0 refills | Status: DC

## 2016-06-18 NOTE — Progress Notes (Signed)
Pre visit review using our clinic review tool, if applicable. No additional management support is needed unless otherwise documented below in the visit note. 

## 2016-06-27 NOTE — Progress Notes (Addendum)
Subjective:   Anthony Collins is a 71 y.o. male who presents for Medicare Annual/Subsequent preventive examination.  Patient states that he continues to have urinary issues. Reports frequency and urgency but denies pain when urinating. Comments that he feels he does not empty all of the urine from his bladder when he urinates.   Patient reports being stressed due to many obligations with business and family. Stress reduction techniques reviewed with patient.  Patient reports that his blood pressure overall has improved from his last PCP visit. Patient brought his monitor from home and there was a difference between values of nurse's  recording using manual cuff and patient's automatic monitor. Nurse 144/84 patient's monitor reading 160/99.  Review of Systems:  No ROS.  Medicare Wellness Visit.  Cardiac Risk Factors include: hypertension;male gender;advanced age (>40men, >48 women);family history of premature cardiovascular disease  Sleep patterns: feels rested on waking, gets up 4 times nightly to void but able to go back to sleep and sleeps 8-9 hours nightly.   Home Safety/Smoke Alarms:  Feels safe in home. Smoke alarms in place.   Living environment; residence and Firearm Safety: 2-story house, no firearms. Lives with wife, no DME needs Seat Belt Safety/Bike Helmet: Wears seat belt.   Counseling:   Eye Exam- yearly appointment, Dr. [facility]- every 6 month appointment,  Dr. Alinda Money  Male:   [facility]-  Last 01/31/09, 10 year recall PSA- No results found for: PSA     Objective:    Vitals: BP (!) 152/82   Pulse 60   Resp 20   Ht 5\' 8"  (1.727 m)   Wt 167 lb (75.8 kg)   SpO2 99%   BMI 25.39 kg/m   Body mass index is 25.39 kg/m.  Tobacco History  Smoking Status  . Never Smoker  Smokeless Tobacco  . Never Used     Counseling given: Not Answered   Past Medical History:  Diagnosis Date  . Bladder calculus   . GERD (gastroesophageal reflux disease)   . History of  kidney stones   . Hypertension   . Spinal stenosis   . Urgency of urination   . Wears glasses    Past Surgical History:  Procedure Laterality Date  . BLEPHAROPLASTY    . EXTRACORPOREAL SHOCK WAVE LITHOTRIPSY  2004  . EYE SURGERY    . INGUINAL HERNIA REPAIR Left 1998  (spinal anesthesia)  . TONSILLECTOMY  age 58   Family History  Problem Relation Age of Onset  . Breast cancer Mother   . Hypertension Mother   . Lung cancer Father    History  Sexual Activity  . Sexual activity: Yes    Outpatient Encounter Prescriptions as of 06/30/2016  Medication Sig  . aspirin EC 81 MG tablet Take 81 mg by mouth daily.  . calcium carbonate (TUMS - DOSED IN MG ELEMENTAL CALCIUM) 500 MG chewable tablet Chew 1 tablet by mouth 3 (three) times daily as needed for indigestion or heartburn. Reported on 05/02/2015  . Cholecalciferol (VITAMIN D3) 1000 UNITS CAPS Take 1 capsule by mouth daily.  . finasteride (PROSCAR) 5 MG tablet Take 1 tablet (5 mg total) by mouth daily.  Marland Kitchen ibuprofen (ADVIL,MOTRIN) 200 MG tablet Take 200 mg by mouth every 6 (six) hours as needed.  . irbesartan (AVAPRO) 300 MG tablet Take 1 tablet (300 mg total) by mouth every morning.  . metoprolol succinate (TOPROL-XL) 25 MG 24 hr tablet Take 1 tablet (25 mg total) by mouth every evening.  Marland Kitchen  Multiple Vitamin (MULTIVITAMIN WITH MINERALS) TABS tablet Take 1 tablet by mouth daily.  Marland Kitchen omeprazole (PRILOSEC) 40 MG capsule Take 1 capsule (40 mg total) by mouth daily as needed (heart burn).  . tamsulosin (FLOMAX) 0.4 MG CAPS capsule Take 1 capsule (0.4 mg total) by mouth daily.  . vitamin B-12 (CYANOCOBALAMIN) 500 MCG tablet Take 500 mcg by mouth daily.  . [DISCONTINUED] oxybutynin (DITROPAN) 5 MG tablet Take 1 tablet (5 mg total) by mouth 2 (two) times daily.   No facility-administered encounter medications on file as of 06/30/2016.     Activities of Daily Living In your present state of health, do you have any difficulty performing the  following activities: 06/30/2016  Hearing? N  Vision? N  Difficulty concentrating or making decisions? N  Walking or climbing stairs? N  Dressing or bathing? N  Doing errands, shopping? N  Preparing Food and eating ? N  Using the Toilet? N  In the past six months, have you accidently leaked urine? Y  Do you have problems with loss of bowel control? N  Managing your Medications? N  Managing your Finances? N  Housekeeping or managing your Housekeeping? N  Some recent data might be hidden    Patient Care Team: Golden Circle, FNP as PCP - General (Family Medicine) Rana Snare, MD as Consulting Physician (Urology)   Assessment:    Physical assessment deferred to PCP.  Exercise Activities and Dietary recommendations Current Exercise Habits: Home exercise routine, Type of exercise: walking;stretching;strength training/weights, Time (Minutes): 30, Frequency (Times/Week): 3, Weekly Exercise (Minutes/Week): 90, Intensity: Mild, Exercise limited by: None identified  Diet (meal preparation, eat out, water intake, caffeinated beverages, dairy products, fruits and vegetables): in general, a "healthy" diet  , well balanced, low fat/ cholesterol, low salt, limits sugary drinks, 1-2 cups coffee daily, 2 bottles of water daily.  Encouraged patient to increase water intake during the daytime hours.    Goals    . Stay healthy and enjoy my family          Continue to eat healthy, exercise, decrease stress by making a list at night and using stress reduction techniques      Fall Risk Fall Risk  06/30/2016 06/09/2016 11/16/2015  Falls in the past year? No No No   Depression Screen PHQ 2/9 Scores 06/30/2016 06/09/2016  PHQ - 2 Score 0 0    Cognitive Function       Ad8 score reviewed for issues:  Issues making decisions: no  Less interest in hobbies / activities: no  Repeats questions, stories (family complaining): no  Trouble using ordinary gadgets (microwave, computer, phone):  no  Forgets the month or year: no  Mismanaging finances: no  Remembering appts: no  Daily problems with thinking and/or memory: no Ad8 score is= 0   There is no immunization history on file for this patient. Screening Tests Health Maintenance  Topic Date Due  . Hepatitis C Screening  03/17/1946  . PNA vac Low Risk Adult (1 of 2 - PCV13) 05/22/2017 (Originally 01/22/2011)  . INFLUENZA VACCINE  10/22/2016  . COLONOSCOPY  02/01/2019  . TETANUS/TDAP  06/05/2021      Plan:     Notified PCP of continuing urinary issues and blood pressure values and difference between patient's monitor and nurse reading from manual cuff. Patient will continue to monitor b/p at home. Patient will make an appointment with Dr. Risa Grill (urologist) as instructed by PCP.  PCP instructed patient to stop taking oxybutynin and ordered  finasteride which was sent to his pharmacy.    Hep-C screening ordered today and stress reduction education was attached to patient's AVS  Continue to eat heart healthy diet (full of fruits, vegetables, whole grains, lean protein, water--limit salt, fat, and sugar intake) and increase physical activity as tolerated.  Continue doing brain stimulating activities (puzzles, reading, adult coloring books, staying active) to keep memory sharp.   Bring a copy of your advance directives to your next office visit.  During the course of the visit the patient was educated and counseled about the following appropriate screening and preventive services:   Vaccines to include Pneumoccal, Influenza, Hepatitis B, Td, Zostavax, HCV  Cardiovascular Disease  Colorectal cancer screening  Diabetes screening  Prostate Cancer Screening  Glaucoma screening  Nutrition counseling   Patient Instructions (the written plan) was given to the patient.    Michiel Cowboy, RN  06/30/2016   Medical screening examination/treatment/procedure(s) were performed by non-physician practitioner and as  supervising provider I was immediately available for consultation/collaboration. I agree with treatment plan. Mauricio Po, FNP

## 2016-06-27 NOTE — Progress Notes (Signed)
Pre visit review using our clinic review tool, if applicable. No additional management support is needed unless otherwise documented below in the visit note. 

## 2016-06-30 ENCOUNTER — Other Ambulatory Visit: Payer: Self-pay | Admitting: Family

## 2016-06-30 ENCOUNTER — Encounter: Payer: Self-pay | Admitting: Family

## 2016-06-30 ENCOUNTER — Ambulatory Visit (INDEPENDENT_AMBULATORY_CARE_PROVIDER_SITE_OTHER): Payer: Medicare Other | Admitting: *Deleted

## 2016-06-30 ENCOUNTER — Other Ambulatory Visit: Payer: Medicare Other

## 2016-06-30 VITALS — BP 152/82 | HR 60 | Resp 20 | Ht 68.0 in | Wt 167.0 lb

## 2016-06-30 DIAGNOSIS — Z1159 Encounter for screening for other viral diseases: Secondary | ICD-10-CM | POA: Diagnosis not present

## 2016-06-30 DIAGNOSIS — Z9189 Other specified personal risk factors, not elsewhere classified: Secondary | ICD-10-CM

## 2016-06-30 DIAGNOSIS — Z0000 Encounter for general adult medical examination without abnormal findings: Secondary | ICD-10-CM

## 2016-06-30 DIAGNOSIS — R350 Frequency of micturition: Secondary | ICD-10-CM

## 2016-06-30 LAB — HEPATITIS C ANTIBODY: HCV Ab: NEGATIVE

## 2016-06-30 MED ORDER — FINASTERIDE 5 MG PO TABS: 5.0000 mg | ORAL_TABLET | Freq: Every day | ORAL | 0 refills | Status: DC

## 2016-06-30 NOTE — Assessment & Plan Note (Signed)
Recently informed medication is not overly effective and continues to have issues with stream strength. Discontinue oxybutinin and start finasteride. Continue current dosage of tamsulosin. Recommend follow up with urology.

## 2016-06-30 NOTE — Patient Instructions (Addendum)
Continue to eat heart healthy diet (full of fruits, vegetables, whole grains, lean protein, water--limit salt, fat, and sugar intake) and increase physical activity as tolerated.  Continue doing brain stimulating activities (puzzles, reading, adult coloring books, staying active) to keep memory sharp.   Bring a copy of your advance directives to your next office visit.  Make an appointment with Dr. Risa Grill [phone] PCP instructs for you to stop taking oxybutynin. PCP ordered finasteride which was sent to your pharmacy.  Anthony Collins , Thank you for taking time to come for your Medicare Wellness Visit. I appreciate your ongoing commitment to your health goals. Please review the following plan we discussed and let me know if I can assist you in the future.   These are the goals we discussed: Goals    . Stay healthy and enjoy my family          Continue to eat healthy, exercise, decrease stress by making a list at night and using stress reduction techniques       This is a list of the screening recommended for you and due dates:  Health Maintenance  Topic Date Due  .  Hepatitis C: One time screening is recommended by Center for Disease Control  (CDC) for  adults born from [birth year] through 1965.   [birth year]  . Pneumonia vaccines (1 of 2 - PCV13) 05/22/2017*  . Flu Shot  10/22/2016  . Colon Cancer Screening  02/01/2019  . Tetanus Vaccine  06/05/2021  *Topic was postponed. The date shown is not the original due date.    Stress and Stress Management Stress is a normal reaction to life events. It is what you feel when life demands more than you are used to or more than you can handle. Some stress can be useful. For example, the stress reaction can help you catch the last bus of the day, study for a test, or meet a deadline at work. But stress that occurs too often or for too long can cause problems. It can affect your emotional health and interfere with relationships and normal daily  activities. Too much stress can weaken your immune system and increase your risk for physical illness. If you already have a medical problem, stress can make it worse. What are the causes? All sorts of life events may cause stress. An event that causes stress for one person may not be stressful for another person. Major life events commonly cause stress. These may be positive or negative. Examples include losing your job, moving into a new home, getting married, having a baby, or losing a loved one. Less obvious life events may also cause stress, especially if they occur day after day or in combination. Examples include working long hours, driving in traffic, caring for children, being in debt, or being in a difficult relationship. What are the signs or symptoms? Stress may cause emotional symptoms including, the following:  Anxiety. This is feeling worried, afraid, on edge, overwhelmed, or out of control.  Anger. This is feeling irritated or impatient.  Depression. This is feeling sad, down, helpless, or guilty.  Difficulty focusing, remembering, or making decisions. Stress may cause physical symptoms, including the following:  Aches and pains. These may affect your head, neck, back, stomach, or other areas of your body.  Tight muscles or clenched jaw.  Low energy or trouble sleeping. Stress may cause unhealthy behaviors, including the following:  Eating to feel better (overeating) or skipping meals.  Sleeping too little, too much,  or both.  Working too much or putting off tasks (procrastination).  Smoking, drinking alcohol, or using drugs to feel better. How is this diagnosed? Stress is diagnosed through an assessment by your health care provider. Your health care provider will ask questions about your symptoms and any stressful life events.Your health care provider will also ask about your medical history and may order blood tests or other tests. Certain medical conditions and  medicine can cause physical symptoms similar to stress. Mental illness can cause emotional symptoms and unhealthy behaviors similar to stress. Your health care provider may refer you to a mental health professional for further evaluation. How is this treated? Stress management is the recommended treatment for stress.The goals of stress management are reducing stressful life events and coping with stress in healthy ways. Techniques for reducing stressful life events include the following:  Stress identification. Self-monitor for stress and identify what causes stress for you. These skills may help you to avoid some stressful events.  Time management. Set your priorities, keep a calendar of events, and learn to say "no." These tools can help you avoid making too many commitments. Techniques for coping with stress include the following:  Rethinking the problem. Try to think realistically about stressful events rather than ignoring them or overreacting. Try to find the positives in a stressful situation rather than focusing on the negatives.  Exercise. Physical exercise can release both physical and emotional tension. The key is to find a form of exercise you enjoy and do it regularly.  Relaxation techniques. These relax the body and mind. Examples include yoga, meditation, tai chi, biofeedback, deep breathing, progressive muscle relaxation, listening to music, being out in nature, journaling, and other hobbies. Again, the key is to find one or more that you enjoy and can do regularly.  Healthy lifestyle. Eat a balanced diet, get plenty of sleep, and do not smoke. Avoid using alcohol or drugs to relax.  Strong support network. Spend time with family, friends, or other people you enjoy being around.Express your feelings and talk things over with someone you trust. Counseling or talktherapy with a mental health professional may be helpful if you are having difficulty managing stress on your own.  Medicine is typically not recommended for the treatment of stress.Talk to your health care provider if you think you need medicine for symptoms of stress. Follow these instructions at home:  Keep all follow-up visits as directed by your health care provider.  Take all medicines as directed by your health care provider. Contact a health care provider if:  Your symptoms get worse or you start having new symptoms.  You feel overwhelmed by your problems and can no longer manage them on your own. Get help right away if:  You feel like hurting yourself or someone else. This information is not intended to replace advice given to you by your health care provider. Make sure you discuss any questions you have with your health care provider. Document Released: 09/03/2000 Document Revised: 08/16/2015 Document Reviewed: 11/02/2012 Elsevier Interactive Patient Education  2017 Anthony Collins.

## 2016-07-07 ENCOUNTER — Other Ambulatory Visit: Payer: Self-pay | Admitting: Family

## 2016-07-27 ENCOUNTER — Other Ambulatory Visit: Payer: Self-pay | Admitting: Family

## 2016-07-28 DIAGNOSIS — N400 Enlarged prostate without lower urinary tract symptoms: Secondary | ICD-10-CM | POA: Diagnosis not present

## 2016-07-28 DIAGNOSIS — N200 Calculus of kidney: Secondary | ICD-10-CM | POA: Diagnosis not present

## 2016-07-28 DIAGNOSIS — Z125 Encounter for screening for malignant neoplasm of prostate: Secondary | ICD-10-CM | POA: Diagnosis not present

## 2016-07-28 DIAGNOSIS — N5201 Erectile dysfunction due to arterial insufficiency: Secondary | ICD-10-CM | POA: Diagnosis not present

## 2016-09-05 ENCOUNTER — Other Ambulatory Visit: Payer: Self-pay | Admitting: Family

## 2016-09-05 DIAGNOSIS — I10 Essential (primary) hypertension: Secondary | ICD-10-CM

## 2016-09-05 DIAGNOSIS — R350 Frequency of micturition: Secondary | ICD-10-CM

## 2016-10-24 DIAGNOSIS — D1801 Hemangioma of skin and subcutaneous tissue: Secondary | ICD-10-CM | POA: Diagnosis not present

## 2016-10-24 DIAGNOSIS — D2372 Other benign neoplasm of skin of left lower limb, including hip: Secondary | ICD-10-CM | POA: Diagnosis not present

## 2016-10-24 DIAGNOSIS — L821 Other seborrheic keratosis: Secondary | ICD-10-CM | POA: Diagnosis not present

## 2016-10-24 DIAGNOSIS — L812 Freckles: Secondary | ICD-10-CM | POA: Diagnosis not present

## 2016-10-24 DIAGNOSIS — D2262 Melanocytic nevi of left upper limb, including shoulder: Secondary | ICD-10-CM | POA: Diagnosis not present

## 2016-11-10 DIAGNOSIS — Z961 Presence of intraocular lens: Secondary | ICD-10-CM | POA: Diagnosis not present

## 2016-12-11 ENCOUNTER — Other Ambulatory Visit: Payer: Self-pay | Admitting: Family

## 2016-12-11 ENCOUNTER — Telehealth: Payer: Self-pay | Admitting: Family

## 2016-12-11 DIAGNOSIS — I10 Essential (primary) hypertension: Secondary | ICD-10-CM

## 2016-12-11 DIAGNOSIS — R350 Frequency of micturition: Secondary | ICD-10-CM

## 2016-12-11 NOTE — Telephone Encounter (Signed)
Pt asking to transfer from [place] to [place], due to [facility] leaving the practice in Oct. Please advise ok to schedule.

## 2016-12-11 NOTE — Telephone Encounter (Signed)
Ok with me 

## 2016-12-11 NOTE — Telephone Encounter (Signed)
Ok with me. Thanks.  

## 2016-12-12 NOTE — Telephone Encounter (Signed)
Pt has been scheduled.  °

## 2017-01-09 ENCOUNTER — Other Ambulatory Visit: Payer: Self-pay | Admitting: Family

## 2017-01-09 DIAGNOSIS — I10 Essential (primary) hypertension: Secondary | ICD-10-CM

## 2017-01-09 DIAGNOSIS — R350 Frequency of micturition: Secondary | ICD-10-CM

## 2017-01-12 ENCOUNTER — Encounter: Payer: Self-pay | Admitting: Physician Assistant

## 2017-01-12 ENCOUNTER — Ambulatory Visit (INDEPENDENT_AMBULATORY_CARE_PROVIDER_SITE_OTHER): Payer: Medicare Other | Admitting: Physician Assistant

## 2017-01-12 VITALS — BP 160/82 | HR 65 | Temp 98.1°F | Resp 14 | Ht 68.0 in | Wt 161.0 lb

## 2017-01-12 DIAGNOSIS — K4090 Unilateral inguinal hernia, without obstruction or gangrene, not specified as recurrent: Secondary | ICD-10-CM

## 2017-01-12 DIAGNOSIS — Z23 Encounter for immunization: Secondary | ICD-10-CM

## 2017-01-12 DIAGNOSIS — I10 Essential (primary) hypertension: Secondary | ICD-10-CM | POA: Diagnosis not present

## 2017-01-12 DIAGNOSIS — R350 Frequency of micturition: Secondary | ICD-10-CM

## 2017-01-12 LAB — LIPID PANEL
Cholesterol: 164 mg/dL (ref 0–200)
HDL: 79.8 mg/dL (ref >39.00)
LDL Cholesterol: 63 mg/dL (ref 0–99)
NonHDL: 84.66
Total CHOL/HDL Ratio: 2
Triglycerides: 107 mg/dL (ref 0.0–149.0)
VLDL: 21.4 mg/dL (ref 0.0–40.0)

## 2017-01-12 LAB — COMPREHENSIVE METABOLIC PANEL
ALT: 16 U/L (ref 0–53)
AST: 20 U/L (ref 0–37)
Albumin: 4.6 g/dL (ref 3.5–5.2)
Alkaline Phosphatase: 53 U/L (ref 39–117)
BUN: 16 mg/dL (ref 6–23)
CO2: 31 mEq/L (ref 19–32)
Calcium: 9.6 mg/dL (ref 8.4–10.5)
Chloride: 100 mEq/L (ref 96–112)
Creatinine, Ser: 0.93 mg/dL (ref 0.40–1.50)
GFR: 85.14 mL/min (ref >60.00)
Glucose, Bld: 115 mg/dL — ABNORMAL HIGH (ref 70–99)
Potassium: 4.4 mEq/L (ref 3.5–5.1)
Sodium: 139 mEq/L (ref 135–145)
Total Bilirubin: 1.3 mg/dL — ABNORMAL HIGH (ref 0.2–1.2)
Total Protein: 7.5 g/dL (ref 6.0–8.3)

## 2017-01-12 MED ORDER — TAMSULOSIN HCL 0.4 MG PO CAPS: 0.4000 mg | ORAL_CAPSULE | Freq: Every day | ORAL | 1 refills | Status: DC

## 2017-01-12 MED ORDER — METOPROLOL SUCCINATE ER 25 MG PO TB24: 25.0000 mg | ORAL_TABLET | Freq: Every evening | ORAL | 1 refills | Status: DC

## 2017-01-12 MED ORDER — IRBESARTAN 300 MG PO TABS: 300.0000 mg | ORAL_TABLET | Freq: Every morning | ORAL | 1 refills | Status: DC

## 2017-01-12 NOTE — Addendum Note (Signed)
Addended by: Leonidas Romberg on: 01/12/2017 09:53 AM   Modules accepted: Orders

## 2017-01-12 NOTE — Patient Instructions (Signed)
Please go to the lab today for blood work.  I will call you with your results. We will alter treatment regimen(s) if indicated by your results.   Please continue BP medications as directed. I have sent in refills. Follow-up in 2 weeks for a quick BP recheck on medication.  You will be contacted by Surgery for assessment of R inguinal hernia.   Follow-up with [facility] as scheduled.  Please schedule a Medicare Wellness with our RN Kim.    Hypertension Hypertension is another name for high blood pressure. High blood pressure forces your heart to work harder to pump blood. This can cause problems over time. There are two numbers in a blood pressure reading. There is a top number (systolic) over a bottom number (diastolic). It is best to have a blood pressure below 120/80. Healthy choices can help lower your blood pressure. You may need medicine to help lower your blood pressure if:  Your blood pressure cannot be lowered with healthy choices.  Your blood pressure is higher than 130/80.  Follow these instructions at home: Eating and drinking  If directed, follow the DASH eating plan. This diet includes: ? Filling half of your plate at each meal with fruits and vegetables. ? Filling one quarter of your plate at each meal with whole grains. Whole grains include whole wheat pasta, brown rice, and whole grain bread. ? Eating or drinking low-fat dairy products, such as skim milk or low-fat yogurt. ? Filling one quarter of your plate at each meal with low-fat (lean) proteins. Low-fat proteins include fish, skinless chicken, eggs, beans, and tofu. ? Avoiding fatty meat, cured and processed meat, or chicken with skin. ? Avoiding premade or processed food.  Eat less than 1,500 mg of salt (sodium) a day.  Limit alcohol use to no more than 1 drink a day for nonpregnant women and 2 drinks a day for men. One drink equals 12 oz of beer, 5 oz of wine, or 1 oz of hard  liquor. Lifestyle  Work with your doctor to stay at a healthy weight or to lose weight. Ask your doctor what the best weight is for you.  Get at least 30 minutes of exercise that causes your heart to beat faster (aerobic exercise) most days of the week. This may include walking, swimming, or biking.  Get at least 30 minutes of exercise that strengthens your muscles (resistance exercise) at least 3 days a week. This may include lifting weights or pilates.  Do not use any products that contain nicotine or tobacco. This includes cigarettes and e-cigarettes. If you need help quitting, ask your doctor.  Check your blood pressure at home as told by your doctor.  Keep all follow-up visits as told by your doctor. This is important. Medicines  Take over-the-counter and prescription medicines only as told by your doctor. Follow directions carefully.  Do not skip doses of blood pressure medicine. The medicine does not work as well if you skip doses. Skipping doses also puts you at risk for problems.  Ask your doctor about side effects or reactions to medicines that you should watch for. Contact a doctor if:  You think you are having a reaction to the medicine you are taking.  You have headaches that keep coming back (recurring).  You feel dizzy.  You have swelling in your ankles.  You have trouble with your vision. Get help right away if:  You get a very bad headache.  You start to feel confused.  You feel weak or numb.  You feel faint.  You get very bad pain in your: ? Chest. ? Belly (abdomen).  You throw up (vomit) more than once.  You have trouble breathing. Summary  Hypertension is another name for high blood pressure.  Making healthy choices can help lower blood pressure. If your blood pressure cannot be controlled with healthy choices, you may need to take medicine. This information is not intended to replace advice given to you by your health care provider. Make sure  you discuss any questions you have with your health care provider. Document Released: 08/27/2007 Document Revised: 02/06/2016 Document Reviewed: 02/06/2016 Elsevier Interactive Patient Education  [employer].

## 2017-01-12 NOTE — Progress Notes (Signed)
Pre visit review using our clinic review tool, if applicable. No additional management support is needed unless otherwise documented below in the visit note. 

## 2017-01-12 NOTE — Assessment & Plan Note (Signed)
BP above goal today. Patient out of medication. Refills given. Asymptomatic. Home BP look good. Follow-up in 2 weeks on medication for BP recheck.

## 2017-01-12 NOTE — Assessment & Plan Note (Signed)
Recurrence of herniation. Not incarcerated. Referral to general surgery placed.

## 2017-01-12 NOTE — Progress Notes (Signed)
Patient presents to clinic today to establish care.  Acute Concerns: Patient with history of R inguinal hernia s/p repair 20 years ago. Notes recurrence of bulge over the past year. Is wearing two pairs of underwear daily for support and to alleviate pressure. Some pain with heavy lifting. Otherwise asymptomatic.   Chronic Issues: Hypertension -- Long-standing history. Currently on multidrug regimen. Is taking as directed but currently out of medication. Patient denies chest pain, palpitations, lightheadedness, dizziness, vision changes or frequent headaches. Is taking 81 mg ASA daily.   BP Readings from Last 3 Encounters:  01/12/17 (!) 160/82  06/30/16 (!) 152/82  06/09/16 (!) 170/98    Health Maintenance: Immunizations -- Flu shot today. Tetanus up-to-date. Patient endorses pneumonia series is up-to-date. Wants Shingrix today. Colonoscopy -- up-to-date  Past Medical History:  Diagnosis Date  . Bladder calculus   . GERD (gastroesophageal reflux disease)   . History of kidney stones   . Hypertension   . Spinal stenosis   . Urgency of urination   . Wears glasses     Past Surgical History:  Procedure Laterality Date  . BLEPHAROPLASTY    . EXTRACORPOREAL SHOCK WAVE LITHOTRIPSY  2004  . EYE SURGERY    . INGUINAL HERNIA REPAIR Left 1998  (spinal anesthesia)  . TONSILLECTOMY  age 73    Current Outpatient Prescriptions on File Prior to Visit  Medication Sig Dispense Refill  . aspirin EC 81 MG tablet Take 81 mg by mouth daily.    . calcium carbonate (TUMS - DOSED IN MG ELEMENTAL CALCIUM) 500 MG chewable tablet Chew 1 tablet by mouth 3 (three) times daily as needed for indigestion or heartburn. Reported on 05/02/2015    . Cholecalciferol (VITAMIN D3) 1000 UNITS CAPS Take 1 capsule by mouth daily.    . finasteride (PROSCAR) 5 MG tablet TAKE 1 TABLET (5 MG TOTAL) BY MOUTH DAILY. 90 tablet 0  . ibuprofen (ADVIL,MOTRIN) 200 MG tablet Take 200 mg by mouth every 6 (six) hours as needed.     . Multiple Vitamin (MULTIVITAMIN WITH MINERALS) TABS tablet Take 1 tablet by mouth daily.    Marland Kitchen omeprazole (PRILOSEC) 40 MG capsule Take 1 capsule (40 mg total) by mouth daily as needed (heart burn). 30 capsule 2  . vitamin B-12 (CYANOCOBALAMIN) 500 MCG tablet Take 500 mcg by mouth daily.     No current facility-administered medications on file prior to visit.     No Known Allergies  Family History  Problem Relation Age of Onset  . Breast cancer Mother   . Hypertension Mother   . Lung cancer Father     Social History   Social History  . Marital status: Married    Spouse name: N/A  . Number of children: N/A  . Years of education: 35   Occupational History  . Retired     Social History Main Topics  . Smoking status: Never Smoker  . Smokeless tobacco: Never Used  . Alcohol use 8.4 oz/week    14 Glasses of wine per week     Comment: 2 wine daily  . Drug use: No  . Sexual activity: Yes   Other Topics Concern  . 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  HENT: Negative for ear discharge, ear pain, hearing loss and tinnitus.   Eyes: Negative for blurred vision, double vision, photophobia  and pain.  Respiratory: Negative for cough and shortness of breath.   Cardiovascular: Negative for chest pain and palpitations.  Gastrointestinal: Negative for abdominal pain, blood in stool, constipation, diarrhea, heartburn, melena, nausea and vomiting.  Genitourinary: Negative for dysuria, flank pain, frequency, hematuria and urgency.  Musculoskeletal: Negative for falls.  Neurological: Negative for dizziness, loss of consciousness and headaches.  Endo/Heme/Allergies: Negative for environmental allergies.  Psychiatric/Behavioral: Negative for depression, hallucinations, substance abuse and suicidal ideas. The patient is not nervous/anxious and  does not have insomnia.    BP (!) 160/82   Pulse 65   Temp 98.1 F (36.7 C) (Oral)   Resp 14   Ht 5\' 8"  (1.727 m)   Wt 161 lb (73 kg)   SpO2 98%   BMI 24.48 kg/m   Physical Exam  Constitutional: He is oriented to person, place, and time and well-developed, well-nourished, and in no distress.  HENT:  Head: Normocephalic and atraumatic.  Eyes: Conjunctivae are normal.  Neck: Neck supple.  Cardiovascular: Normal rate, regular rhythm, normal heart sounds and intact distal pulses.   Pulmonary/Chest: Effort normal and breath sounds normal. No respiratory distress. He has no wheezes. He has no rales. He exhibits no tenderness.  Abdominal: A hernia is present. Hernia confirmed positive in the right inguinal area (direct inguinal hernia palpable -- reducible but mildly tender).  Neurological: He is alert and oriented to person, place, and time.  Skin: Skin is warm and dry. No rash noted.  Psychiatric: Affect normal.  Vitals reviewed.  Assessment/Plan: Essential hypertension BP above goal today. Patient out of medication. Refills given. Asymptomatic. Home BP look good. Follow-up in 2 weeks on medication for BP recheck.  Direct inguinal hernia Recurrence of herniation. Not incarcerated. Referral to general surgery placed.     Leeanne Rio, PA-C

## 2017-01-13 ENCOUNTER — Other Ambulatory Visit (INDEPENDENT_AMBULATORY_CARE_PROVIDER_SITE_OTHER): Payer: Medicare Other

## 2017-01-13 DIAGNOSIS — R7309 Other abnormal glucose: Secondary | ICD-10-CM

## 2017-01-13 LAB — HEMOGLOBIN A1C: Hgb A1c MFr Bld: 5.2 % (ref 4.6–6.5)

## 2017-01-26 ENCOUNTER — Encounter: Payer: Self-pay | Admitting: Physician Assistant

## 2017-01-26 ENCOUNTER — Ambulatory Visit (INDEPENDENT_AMBULATORY_CARE_PROVIDER_SITE_OTHER): Payer: Medicare Other | Admitting: Physician Assistant

## 2017-01-26 DIAGNOSIS — I10 Essential (primary) hypertension: Secondary | ICD-10-CM | POA: Diagnosis not present

## 2017-01-26 LAB — BASIC METABOLIC PANEL
BUN: 21 mg/dL (ref 6–23)
CALCIUM: 9.5 mg/dL (ref 8.4–10.5)
CHLORIDE: 101 meq/L (ref 96–112)
CO2: 30 meq/L (ref 19–32)
Creatinine, Ser: 1.02 mg/dL (ref 0.40–1.50)
GFR: 76.52 mL/min (ref >60.00)
GLUCOSE: 107 mg/dL — AB (ref 70–99)
Potassium: 4.6 mEq/L (ref 3.5–5.1)
SODIUM: 138 meq/L (ref 135–145)

## 2017-01-26 NOTE — Progress Notes (Signed)
Patient presents to clinic today for 2-week follow up of hypertension. At last visit, patient was restarted on Avapro that he had ran out of. Patient is taking medications as directed. Is following DASH diet. Is checking BP at home. Has a log for review. BP averaging 120s/70s at home. Patient denies chest pain, palpitations, lightheadedness, dizziness, vision changes or frequent headaches.  BP Readings from Last 3 Encounters:  01/26/17 122/80  01/12/17 (!) 160/82  06/30/16 (!) 152/82   Patient also notes passing a kidney stone over the weekend. Has brought in for review. Is followed by Urology (Dr. Pilar Jarvis).   Past Medical History:  Diagnosis Date  . Bladder calculus   . GERD (gastroesophageal reflux disease)   . History of kidney stones   . Hypertension   . Spinal stenosis   . Urgency of urination   . Wears glasses     Current Outpatient Medications on File Prior to Visit  Medication Sig Dispense Refill  . aspirin EC 81 MG tablet Take 81 mg by mouth daily.    . calcium carbonate (TUMS - DOSED IN MG ELEMENTAL CALCIUM) 500 MG chewable tablet Chew 1 tablet by mouth 3 (three) times daily as needed for indigestion or heartburn. Reported on 05/02/2015    . Cholecalciferol (VITAMIN D3) 1000 UNITS CAPS Take 1 capsule by mouth daily.    . finasteride (PROSCAR) 5 MG tablet TAKE 1 TABLET (5 MG TOTAL) BY MOUTH DAILY. 90 tablet 0  . ibuprofen (ADVIL,MOTRIN) 200 MG tablet Take 200 mg by mouth every 6 (six) hours as needed.    . irbesartan (AVAPRO) 300 MG tablet Take 1 tablet (300 mg total) by mouth every morning. 90 tablet 1  . metoprolol succinate (TOPROL-XL) 25 MG 24 hr tablet Take 1 tablet (25 mg total) by mouth every evening. 90 tablet 1  . Multiple Vitamin (MULTIVITAMIN WITH MINERALS) TABS tablet Take 1 tablet by mouth daily.    Marland Kitchen omeprazole (PRILOSEC) 40 MG capsule Take 1 capsule (40 mg total) by mouth daily as needed (heart burn). 30 capsule 2  . SF 1.1 % GEL dental gel APPLY PEA SIZE  AMOUNT TO TOOTHBRUSH. BRUSH FOR 2 MINS AND EXPECTORATE DO NOT EAT OR DRINK FOR 30 MINS  0  . tamsulosin (FLOMAX) 0.4 MG CAPS capsule Take 1 capsule (0.4 mg total) by mouth daily. 90 capsule 1  . vitamin B-12 (CYANOCOBALAMIN) 500 MCG tablet Take 500 mcg by mouth daily.     No current facility-administered medications on file prior to visit.     No Known Allergies  Family History  Problem Relation Age of Onset  . Breast cancer Mother   . Hypertension Mother   . Lung cancer Father     Social History   Socioeconomic History  . Marital status: Married    Spouse name: None  . Number of children: None  . Years of education: 90  . Highest education level: None  Social Needs  . Financial resource strain: None  . Food insecurity - worry: None  . Food insecurity - inability: None  . Transportation needs - medical: None  . Transportation needs - non-medical: None  Occupational History  . Occupation: Retired   Tobacco Use  . Smoking status: Never Smoker  . Smokeless tobacco: Never Used  Substance and Sexual Activity  . Alcohol use: Yes    Alcohol/week: 8.4 oz    Types: 14 Glasses of wine per week    Comment: 2 wine daily  . Drug  use: No  . Sexual activity: Yes  Other Topics Concern  . 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  All other ROS are negative.  BP 122/80 (BP Location: Left Arm, Cuff Size: Normal)   Pulse 71   Temp 98.1 F (36.7 C) (Oral)   Resp 14   Ht _0  (1.727 m)   Wt 162 lb (73.5 kg)   SpO2 99%   BMI 24.63 kg/m   Physical Exam  Constitutional: He is oriented to person, place, and time and well-developed, well-nourished, and in no distress.  HENT:  Head: Normocephalic and atraumatic.  Eyes: Conjunctivae are normal.  Neck: Neck supple.  Cardiovascular: Normal rate, regular rhythm, normal heart sounds and intact distal pulses.  Neurological: He is alert and  oriented to person, place, and time.  Skin: Skin is warm and dry. No rash noted.  Psychiatric: Affect normal.  Vitals reviewed.  Recent Results (from the past 2160 hour(s))  Comp Met (CMET)     Status: Abnormal   Collection Time: 01/12/17  9:16 AM  Result Value Ref Range   Sodium 139 135 - 145 mEq/L   Potassium 4.4 3.5 - 5.1 mEq/L   Chloride 100 96 - 112 mEq/L   CO2 31 19 - 32 mEq/L   Glucose, Bld 115 (H) 70 - 99 mg/dL   BUN 16 6 - 23 mg/dL   Creatinine, Ser 0.93 0.40 - 1.50 mg/dL   Total Bilirubin 1.3 (H) 0.2 - 1.2 mg/dL   Alkaline Phosphatase 53 39 - 117 U/L   AST 20 0 - 37 U/L   ALT 16 0 - 53 U/L   Total Protein 7.5 6.0 - 8.3 g/dL   Albumin 4.6 3.5 - 5.2 g/dL   Calcium 9.6 8.4 - 10.5 mg/dL   GFR 85.14 >60.00 mL/min  Lipid Profile     Status: None   Collection Time: 01/12/17  9:16 AM  Result Value Ref Range   Cholesterol 164 0 - 200 mg/dL    Comment: ATP III Classification       Desirable:  < 200 mg/dL               Borderline High:  200 - 239 mg/dL          High:  > = 240 mg/dL   Triglycerides 107.0 0.0 - 149.0 mg/dL    Comment: Normal:  <150 mg/dLBorderline High:  150 - 199 mg/dL   HDL 79.80 >39.00 mg/dL   VLDL 21.4 0.0 - 40.0 mg/dL   LDL Cholesterol 63 0 - 99 mg/dL   Total CHOL/HDL Ratio 2     Comment:                Men          Women1/2 Average Risk     3.4          3.3Average Risk          5.0          4.42X Average Risk          9.6          7.13X Average Risk          15.0          11.0                       NonHDL 84.66  Comment: NOTE:  Non-HDL goal should be 30 mg/dL higher than patient's LDL goal (i.e. LDL goal of < 70 mg/dL, would have non-HDL goal of < 100 mg/dL)  Hemoglobin A1c     Status: None   Collection Time: 01/13/17  9:08 AM  Result Value Ref Range   Hgb A1c MFr Bld 5.2 4.6 - 6.5 %    Comment: Glycemic Control Guidelines for People with Diabetes:Non Diabetic:  <6%Goal of Therapy: <7%Additional Action Suggested:  >8%    Assessment/Plan: Essential  hypertension Home BP logs are terrific with BP averaging in the 110s-120s/70s-80s. Has brought in BP cuff today. Is reading similar to manual check. BP elevated here today but completely asymptomatic. Suspect there is white coat hypertension present. Continue home BP measurements so we can keep on file.     Leeanne Rio, PA-C

## 2017-01-26 NOTE — Assessment & Plan Note (Addendum)
Home BP logs are terrific with BP averaging in the 110s-120s/70s-80s. Has brought in BP cuff today. Is reading similar to manual check. BP elevated here today but completely asymptomatic. Suspect there is white coat hypertension present. Continue home BP measurements so we can keep on file. BP rechecked at end of visit at 122/80. Continue current regimen. BMP today. Follow-up 3 months.

## 2017-01-26 NOTE — Progress Notes (Signed)
Pre visit review using our clinic review tool, if applicable. No additional management support is needed unless otherwise documented below in the visit note. 

## 2017-01-26 NOTE — Patient Instructions (Signed)
Please go to the lab for blood work. I will call you with your results. Keep well-hydrated. Restart lemon water.   Over the next week, check BP each AM and PM and write down. Call me in 1 week with readings. You can also upload them to your Mychart if you wish.   Follow-up with me in 3 months for both BP and 2nd Shingrix vaccination.    DASH Eating Plan DASH stands for "Dietary Approaches to Stop Hypertension." The DASH eating plan is a healthy eating plan that has been shown to reduce high blood pressure (hypertension). It may also reduce your risk for type 2 diabetes, heart disease, and stroke. The DASH eating plan may also help with weight loss. What are tips for following this plan? General guidelines  Avoid eating more than 2,300 mg (milligrams) of salt (sodium) a day. If you have hypertension, you may need to reduce your sodium intake to 1,500 mg a day.  Limit alcohol intake to no more than 1 drink a day for nonpregnant women and 2 drinks a day for men. One drink equals 12 oz of beer, 5 oz of wine, or 1 oz of hard liquor.  Work with your health care provider to maintain a healthy body weight or to lose weight. Ask what an ideal weight is for you.  Get at least 30 minutes of exercise that causes your heart to beat faster (aerobic exercise) most days of the week. Activities may include walking, swimming, or biking.  Work with your health care provider or diet and nutrition specialist (dietitian) to adjust your eating plan to your individual calorie needs. Reading food labels  Check food labels for the amount of sodium per serving. Choose foods with less than 5 percent of the Daily Value of sodium. Generally, foods with less than 300 mg of sodium per serving fit into this eating plan.  To find whole grains, look for the word "whole" as the first word in the ingredient list. Shopping  Buy products labeled as "low-sodium" or "no salt added."  Buy fresh foods. Avoid canned foods  and premade or frozen meals. Cooking  Avoid adding salt when cooking. Use salt-free seasonings or herbs instead of table salt or sea salt. Check with your health care provider or pharmacist before using salt substitutes.  Do not fry foods. Cook foods using healthy methods such as baking, boiling, grilling, and broiling instead.  Cook with heart-healthy oils, such as olive, canola, soybean, or sunflower oil. Meal planning   Eat a balanced diet that includes: ? 5 or more servings of fruits and vegetables each day. At each meal, try to fill half of your plate with fruits and vegetables. ? Up to 6-8 servings of whole grains each day. ? Less than 6 oz of lean meat, poultry, or fish each day. A 3-oz serving of meat is about the same size as a deck of cards. One egg equals 1 oz. ? 2 servings of low-fat dairy each day. ? A serving of nuts, seeds, or beans 5 times each week. ? Heart-healthy fats. Healthy fats called Omega-3 fatty acids are found in foods such as flaxseeds and coldwater fish, like sardines, salmon, and mackerel.  Limit how much you eat of the following: ? Canned or prepackaged foods. ? Food that is high in trans fat, such as fried foods. ? Food that is high in saturated fat, such as fatty meat. ? Sweets, desserts, sugary drinks, and other foods with added sugar. ? Full-fat  dairy products.  Do not salt foods before eating.  Try to eat at least 2 vegetarian meals each week.  Eat more home-cooked food and less restaurant, buffet, and fast food.  When eating at a restaurant, ask that your food be prepared with less salt or no salt, if possible. What foods are recommended? The items listed may not be a complete list. Talk with your dietitian about what dietary choices are best for you. Grains Whole-grain or whole-wheat bread. Whole-grain or whole-wheat pasta. Brown rice. [place] Morrow. Bulgur. Whole-grain and low-sodium cereals. Pita bread. Low-fat, low-sodium crackers.  Whole-wheat flour tortillas. Vegetables Fresh or frozen vegetables (raw, steamed, roasted, or grilled). Low-sodium or reduced-sodium tomato and vegetable juice. Low-sodium or reduced-sodium tomato sauce and tomato paste. Low-sodium or reduced-sodium canned vegetables. Fruits All fresh, dried, or frozen fruit. Canned fruit in natural juice (without added sugar). Meat and other protein foods Skinless chicken or [place]. Ground chicken or [place]. Pork with fat trimmed off. Fish and seafood. Egg whites. Dried beans, peas, or lentils. Unsalted nuts, nut butters, and seeds. Unsalted canned beans. Lean cuts of beef with fat trimmed off. Low-sodium, lean deli meat. Dairy Low-fat (1%) or fat-free (skim) milk. Fat-free, low-fat, or reduced-fat cheeses. Nonfat, low-sodium ricotta or cottage cheese. Low-fat or nonfat yogurt. Low-fat, low-sodium cheese. Fats and oils Soft margarine without trans fats. Vegetable oil. Low-fat, reduced-fat, or light mayonnaise and salad dressings (reduced-sodium). Canola, safflower, olive, soybean, and sunflower oils. Avocado. Seasoning and other foods Herbs. Spices. Seasoning mixes without salt. Unsalted popcorn and pretzels. Fat-free sweets. What foods are not recommended? The items listed may not be a complete list. Talk with your dietitian about what dietary choices are best for you. Grains Baked goods made with fat, such as croissants, muffins, or some breads. Dry pasta or rice meal packs. Vegetables Creamed or fried vegetables. Vegetables in a cheese sauce. Regular canned vegetables (not low-sodium or reduced-sodium). Regular canned tomato sauce and paste (not low-sodium or reduced-sodium). Regular tomato and vegetable juice (not low-sodium or reduced-sodium). Angie Fava. Olives. Fruits Canned fruit in a light or heavy syrup. Fried fruit. Fruit in cream or butter sauce. Meat and other protein foods Fatty cuts of meat. Ribs. Fried meat. Berniece Salines. Sausage. Bologna and other  processed lunch meats. Salami. Fatback. Hotdogs. Bratwurst. Salted nuts and seeds. Canned beans with added salt. Canned or smoked fish. Whole eggs or egg yolks. Chicken or [place] with skin. Dairy Whole or 2% milk, cream, and half-and-half. Whole or full-fat cream cheese. Whole-fat or sweetened yogurt. Full-fat cheese. Nondairy creamers. Whipped toppings. Processed cheese and cheese spreads. Fats and oils Butter. Stick margarine. Lard. Shortening. Ghee. Bacon fat. Tropical oils, such as coconut, palm kernel, or palm oil. Seasoning and other foods Salted popcorn and pretzels. Onion salt, garlic salt, seasoned salt, table salt, and sea salt. Worcestershire sauce. Tartar sauce. Barbecue sauce. Teriyaki sauce. Soy sauce, including reduced-sodium. Steak sauce. Canned and packaged gravies. Fish sauce. Oyster sauce. Cocktail sauce. Horseradish that you find on the shelf. Ketchup. Mustard. Meat flavorings and tenderizers. Bouillon cubes. Hot sauce and Tabasco sauce. Premade or packaged marinades. Premade or packaged taco seasonings. Relishes. Regular salad dressings. Where to find more information:  National Heart, Lung, and [place]: https://wilson-eaton.com/  American Heart Association: www.heart.org Summary  The DASH eating plan is a healthy eating plan that has been shown to reduce high blood pressure (hypertension). It may also reduce your risk for type 2 diabetes, heart disease, and stroke.  With the DASH eating plan, you should  limit salt (sodium) intake to 2,300 mg a day. If you have hypertension, you may need to reduce your sodium intake to 1,500 mg a day.  When on the DASH eating plan, aim to eat more fresh fruits and vegetables, whole grains, lean proteins, low-fat dairy, and heart-healthy fats.  Work with your health care provider or diet and nutrition specialist (dietitian) to adjust your eating plan to your individual calorie needs. This information is not intended to replace advice given to  you by your health care provider. Make sure you discuss any questions you have with your health care provider. Document Released: 02/27/2011 Document Revised: 03/03/2016 Document Reviewed: 03/03/2016 Elsevier Interactive Patient Education  2017 [employer].

## 2017-01-27 ENCOUNTER — Ambulatory Visit: Payer: Self-pay | Admitting: General Surgery

## 2017-01-27 DIAGNOSIS — K4090 Unilateral inguinal hernia, without obstruction or gangrene, not specified as recurrent: Secondary | ICD-10-CM | POA: Diagnosis not present

## 2017-02-28 IMAGING — CT CT RENAL STONE PROTOCOL
1 series · 15 of 24 positions shown, 19 images · non-contrast
Comparison: None.

CLINICAL DATA: Right flank pain.

EXAM:
CT ABDOMEN AND PELVIS WITHOUT CONTRAST
TECHNIQUE: Multidetector CT imaging of the abdomen and pelvis was performed
following the standard protocol without IV contrast.

[Series 6: lung · axial · 0.74mm/px · z∈[+1252,+1358]mm · 15 of 24 slices shown, 19 images]
[im 2/24  soft-tissue]
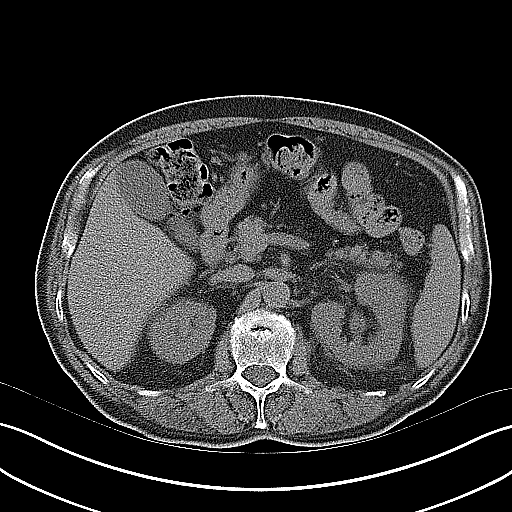
[im 2/24  bone]
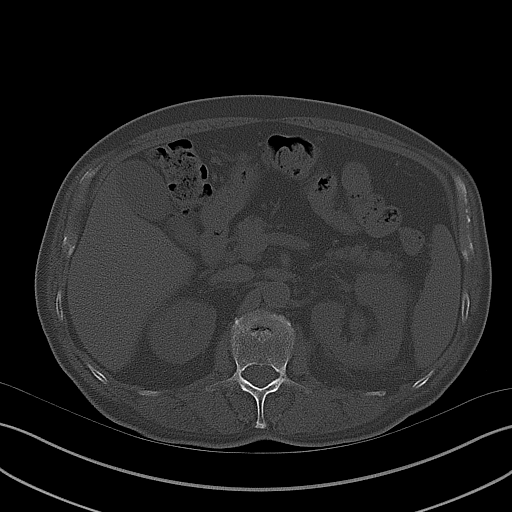
[im 4/24  soft-tissue]
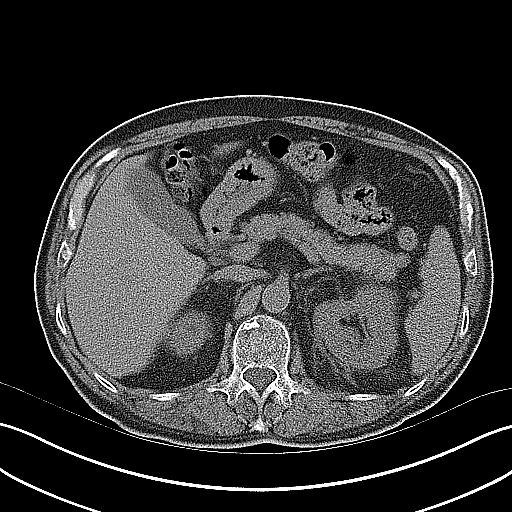
[im 6/24  soft-tissue]
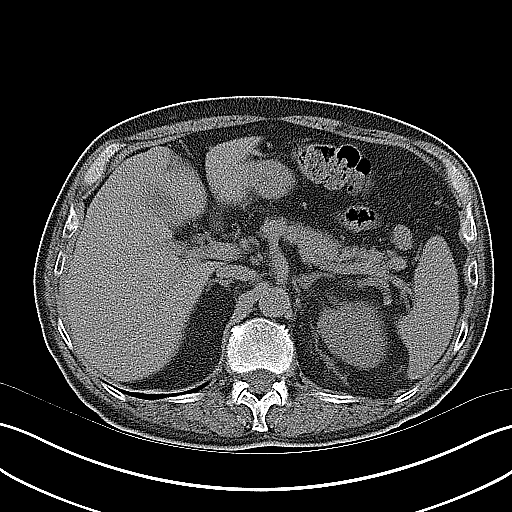
[im 7/24  soft-tissue]
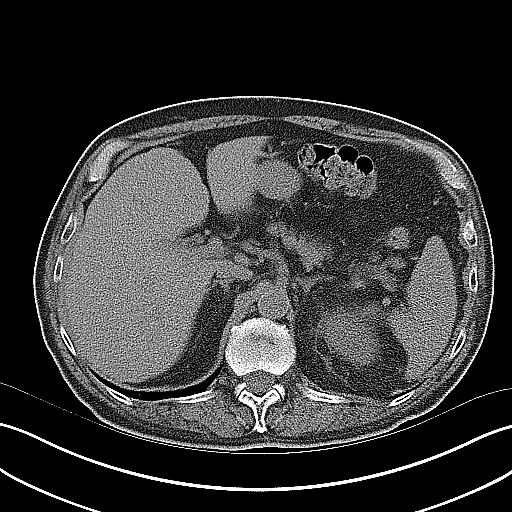
[im 9/24  soft-tissue]
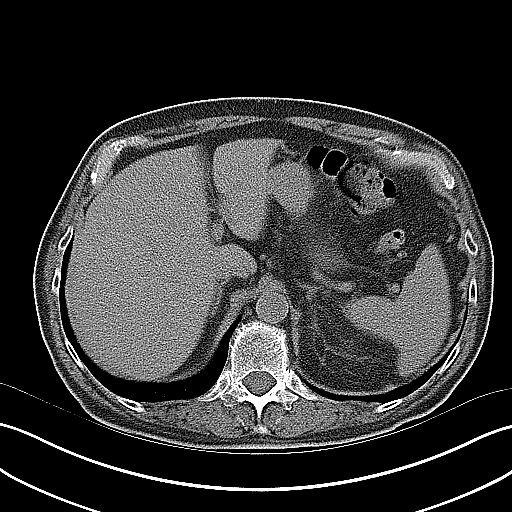
[im 11/24  soft-tissue]
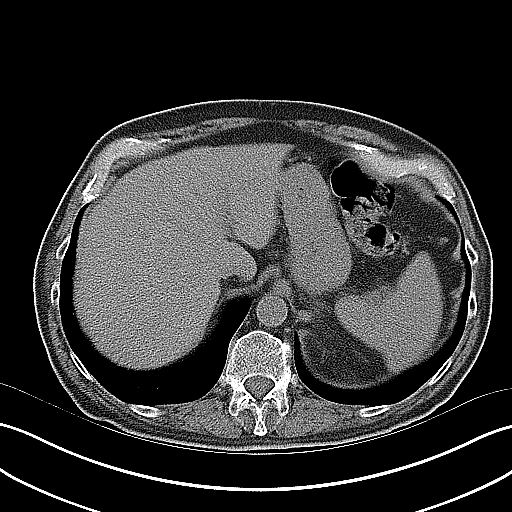
[im 13/24  soft-tissue]
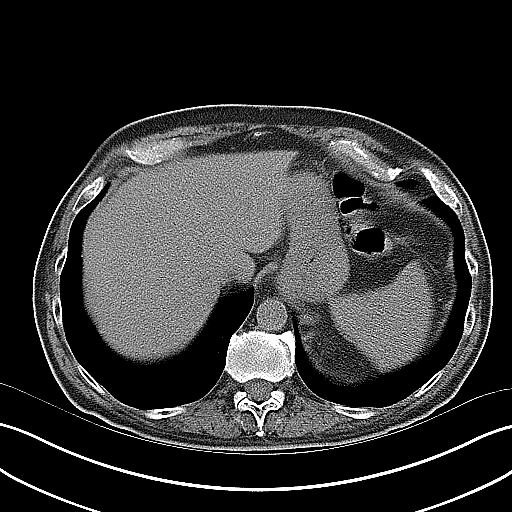
[im 14/24  soft-tissue]
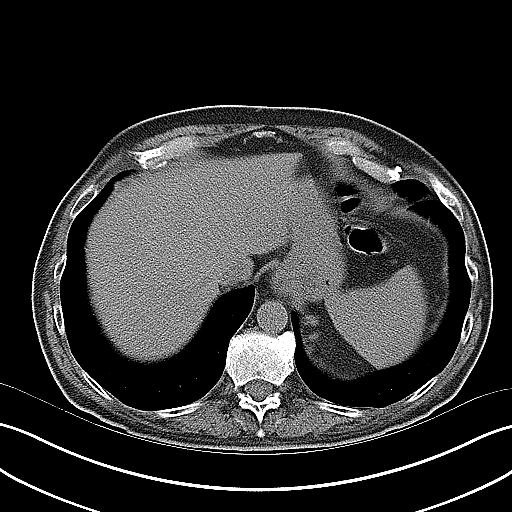
[im 16/24  soft-tissue]
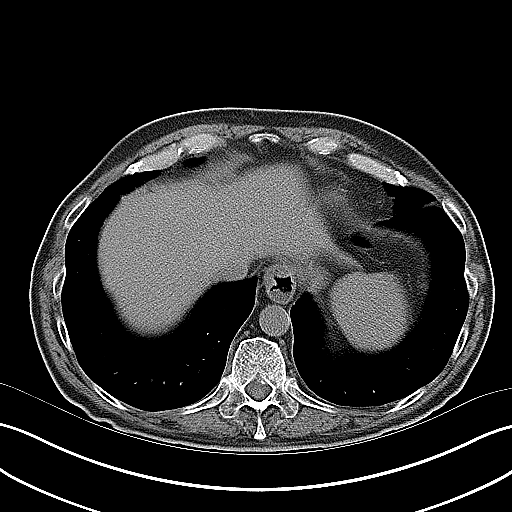
[im 16/24  bone]
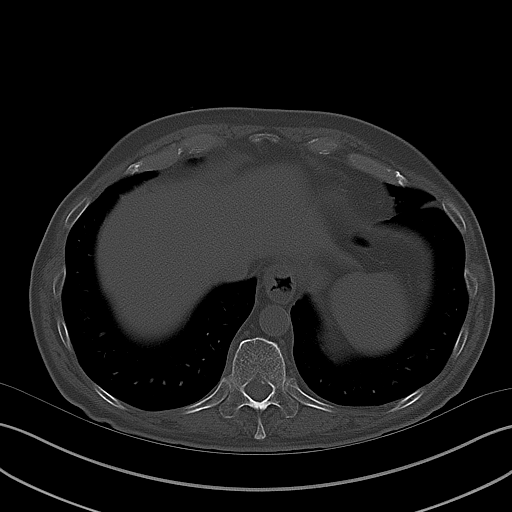
[im 18/24  soft-tissue]
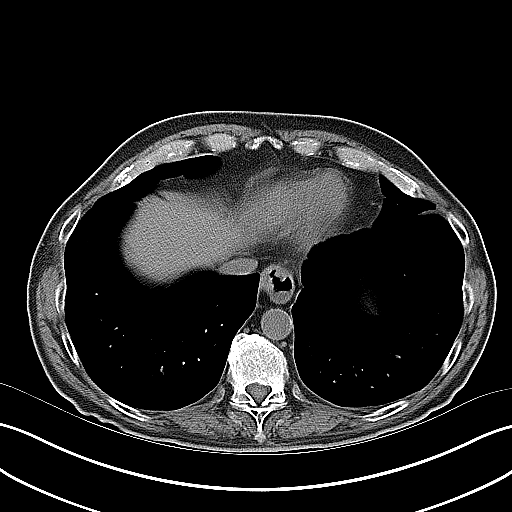
[im 19/24  soft-tissue]
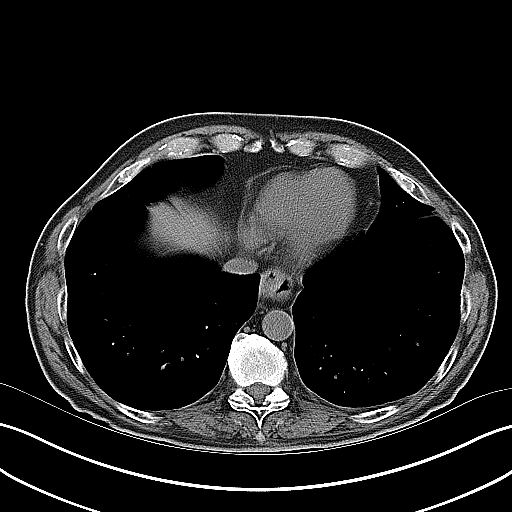
[im 20/24  lung]
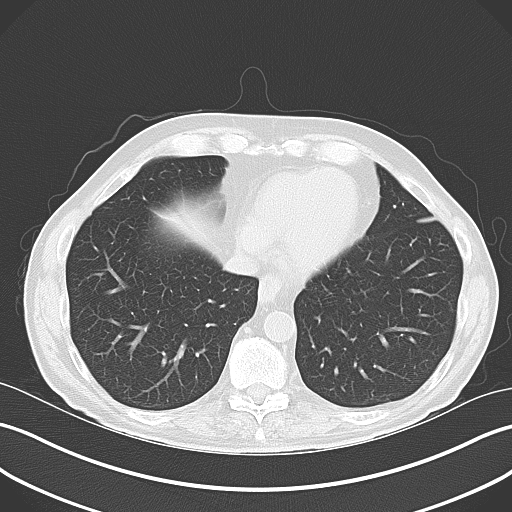
[im 21/24  soft-tissue]
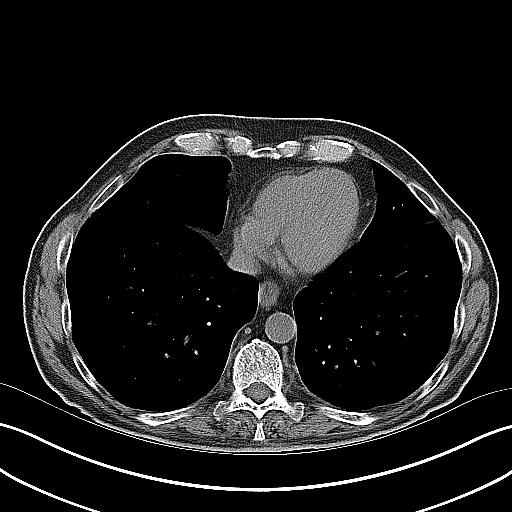
[im 21/24  lung]
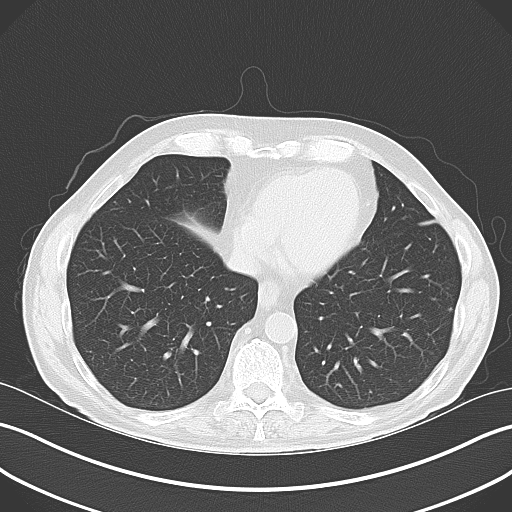
[im 22/24  lung]
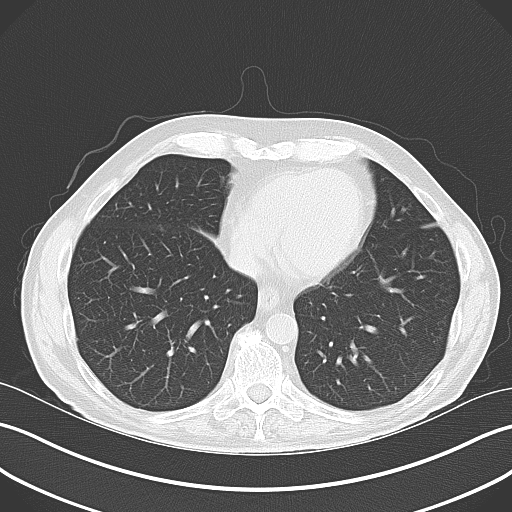
[im 23/24  soft-tissue]
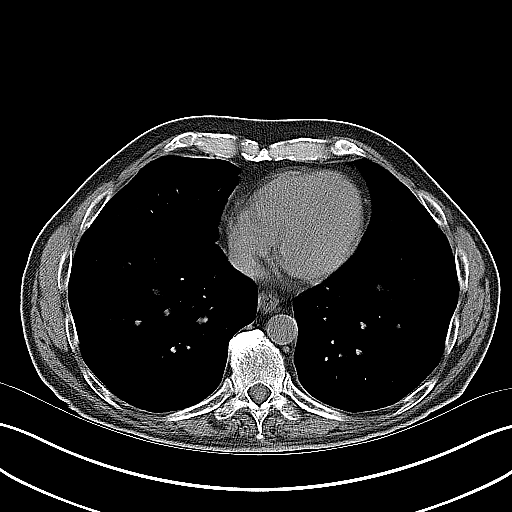
[im 23/24  lung]
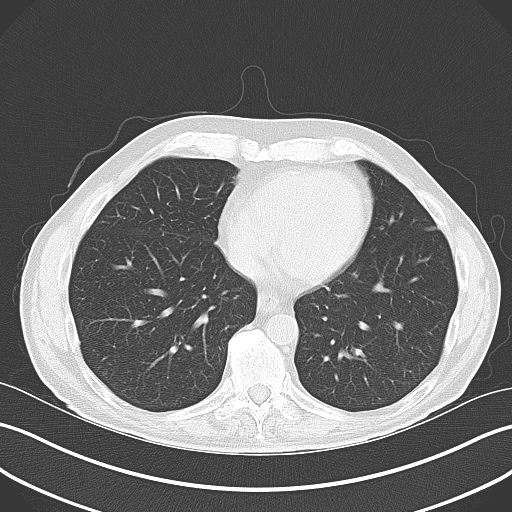

[15 of 24 positions shown; findings below may reference images not displayed]

FINDINGS: There is moderate left hydronephrosis and hydroureter with extensive
left perinephric stranding. 8 mm stone projects near the midline of
the bladder. This conceivably could be at the left UVJ or recently
passed into the bladder (favored). No hydronephrosis on the right.
Bilateral nonobstructing renal stones, the largest in the right
lower pole measuring 8 mm.

Liver, gallbladder, spleen, pancreas and adrenals have an
unremarkable unenhanced appearance. Sigmoid diverticulosis. No
active diverticulitis. Stomach and small bowel are decompressed,
grossly unremarkable. Aorta is normal caliber.

No free fluid or free air. Prostate is enlarged with calcifications.
Aorta is normal caliber.

No acute bony abnormality or focal bone lesion. Degenerative disc
and facet disease in the lumbar spine.
IMPRESSION: Moderate left hydronephrosis and hydroureter with extensive
perinephric stranding. 8 mm stone projects near the midline of the
bladder, presumably recently passed from the left ureter. It is
conceivable this could still be at the left ureterovesical junction
protruding into the bladder.

## 2017-04-24 ENCOUNTER — Encounter (HOSPITAL_BASED_OUTPATIENT_CLINIC_OR_DEPARTMENT_OTHER): Payer: Self-pay | Admitting: *Deleted

## 2017-04-29 ENCOUNTER — Other Ambulatory Visit: Payer: Self-pay | Admitting: Physician Assistant

## 2017-04-29 ENCOUNTER — Other Ambulatory Visit: Payer: Self-pay | Admitting: General Surgery

## 2017-04-29 ENCOUNTER — Ambulatory Visit (INDEPENDENT_AMBULATORY_CARE_PROVIDER_SITE_OTHER): Payer: Medicare Other | Admitting: Physician Assistant

## 2017-04-29 ENCOUNTER — Encounter: Payer: Self-pay | Admitting: Physician Assistant

## 2017-04-29 ENCOUNTER — Ambulatory Visit
Admission: RE | Admit: 2017-04-29 | Discharge: 2017-04-29 | Disposition: A | Discharge status: Home / Self Care | Payer: Medicare Other | Source: Ambulatory Visit | Attending: General Surgery | Admitting: General Surgery

## 2017-04-29 ENCOUNTER — Other Ambulatory Visit: Payer: Self-pay

## 2017-04-29 ENCOUNTER — Encounter (HOSPITAL_BASED_OUTPATIENT_CLINIC_OR_DEPARTMENT_OTHER)
Admission: RE | Admit: 2017-04-29 | Discharge: 2017-04-29 | Disposition: A | Discharge status: Home / Self Care | Payer: Medicare Other | Source: Ambulatory Visit | Attending: General Surgery | Admitting: General Surgery

## 2017-04-29 ENCOUNTER — Ambulatory Visit: Payer: Self-pay | Admitting: General Surgery

## 2017-04-29 ENCOUNTER — Other Ambulatory Visit (HOSPITAL_BASED_OUTPATIENT_CLINIC_OR_DEPARTMENT_OTHER): Payer: Medicare Other

## 2017-04-29 VITALS — BP 130/80 | HR 78 | Temp 97.4°F | Resp 14 | Ht 68.0 in | Wt 163.0 lb

## 2017-04-29 DIAGNOSIS — K4090 Unilateral inguinal hernia, without obstruction or gangrene, not specified as recurrent: Secondary | ICD-10-CM | POA: Insufficient documentation

## 2017-04-29 DIAGNOSIS — Z79899 Other long term (current) drug therapy: Secondary | ICD-10-CM | POA: Diagnosis not present

## 2017-04-29 DIAGNOSIS — K219 Gastro-esophageal reflux disease without esophagitis: Secondary | ICD-10-CM | POA: Diagnosis not present

## 2017-04-29 DIAGNOSIS — Z01812 Encounter for preprocedural laboratory examination: Secondary | ICD-10-CM | POA: Insufficient documentation

## 2017-04-29 DIAGNOSIS — Z01810 Encounter for preprocedural cardiovascular examination: Secondary | ICD-10-CM | POA: Diagnosis not present

## 2017-04-29 DIAGNOSIS — Z01811 Encounter for preprocedural respiratory examination: Secondary | ICD-10-CM

## 2017-04-29 DIAGNOSIS — Z01818 Encounter for other preprocedural examination: Secondary | ICD-10-CM | POA: Diagnosis not present

## 2017-04-29 DIAGNOSIS — I10 Essential (primary) hypertension: Secondary | ICD-10-CM | POA: Insufficient documentation

## 2017-04-29 LAB — CBC WITH DIFFERENTIAL/PLATELET
BASOS PCT: 0 %
Basophils Absolute: 0 10*3/uL (ref 0.0–0.1)
Eosinophils Absolute: 0.1 10*3/uL (ref 0.0–0.7)
Eosinophils Relative: 3 %
HEMATOCRIT: 45.7 % (ref 39.0–52.0)
HEMOGLOBIN: 15.5 g/dL (ref 13.0–17.0)
Lymphocytes Relative: 19 %
Lymphs Abs: 0.9 10*3/uL (ref 0.7–4.0)
MCH: 33 pg (ref 26.0–34.0)
MCHC: 33.9 g/dL (ref 30.0–36.0)
MCV: 97.2 fL (ref 78.0–100.0)
MONOS PCT: 7 %
Monocytes Absolute: 0.4 10*3/uL (ref 0.1–1.0)
NEUTROS ABS: 3.4 10*3/uL (ref 1.7–7.7)
NEUTROS PCT: 71 %
Platelets: 226 10*3/uL (ref 150–400)
RBC: 4.7 MIL/uL (ref 4.22–5.81)
RDW: 13.1 % (ref 11.5–15.5)
WBC: 4.8 10*3/uL (ref 4.0–10.5)

## 2017-04-29 LAB — BASIC METABOLIC PANEL
ANION GAP: 11 (ref 5–15)
BUN: 16 mg/dL (ref 6–23)
BUN: 14 mg/dL (ref 6–20)
CALCIUM: 9.4 mg/dL (ref 8.9–10.3)
CO2: 32 mEq/L (ref 19–32)
CO2: 26 mmol/L (ref 22–32)
Calcium: 9.5 mg/dL (ref 8.4–10.5)
Chloride: 104 mEq/L (ref 96–112)
Chloride: 104 mmol/L (ref 101–111)
Creatinine, Ser: 0.95 mg/dL (ref 0.40–1.50)
Creatinine, Ser: 0.9 mg/dL (ref 0.61–1.24)
GFR: 83 mL/min (ref >60.00)
Glucose, Bld: 104 mg/dL — ABNORMAL HIGH (ref 70–99)
Glucose, Bld: 96 mg/dL (ref 65–99)
Potassium: 5 mEq/L (ref 3.5–5.1)
Potassium: 4.6 mmol/L (ref 3.5–5.1)
SODIUM: 141 mmol/L (ref 135–145)
Sodium: 141 mEq/L (ref 135–145)

## 2017-04-29 MED ORDER — OMEPRAZOLE 40 MG PO CPDR: 40.0000 mg | DELAYED_RELEASE_CAPSULE | Freq: Every day | ORAL | 1 refills | Status: DC | PRN

## 2017-04-29 MED ORDER — ZOSTER VAC RECOMB ADJUVANTED 50 MCG/0.5ML IM SUSR: 0.5000 mL | Freq: Once | INTRAMUSCULAR | 0 refills | Status: AC

## 2017-04-29 NOTE — Addendum Note (Signed)
Addended by: Brunetta Jeans on: 04/29/2017 08:36 AM   Modules accepted: Orders

## 2017-04-29 NOTE — Patient Instructions (Signed)
Please go to the lab today for blood work.  I will call you with your results. We will alter treatment regimen(s) if indicated by your results.   Please continue BP medication, diet and exercise regimen.  I am very impressed with how hard you have been working.   We will follow-up in 6 months for a complete physical.  Return sooner if needed.    DASH Eating Plan DASH stands for "Dietary Approaches to Stop Hypertension." The DASH eating plan is a healthy eating plan that has been shown to reduce high blood pressure (hypertension). It may also reduce your risk for type 2 diabetes, heart disease, and stroke. The DASH eating plan may also help with weight loss. What are tips for following this plan? General guidelines  Avoid eating more than 2,300 mg (milligrams) of salt (sodium) a day. If you have hypertension, you may need to reduce your sodium intake to 1,500 mg a day.  Limit alcohol intake to no more than 1 drink a day for nonpregnant women and 2 drinks a day for men. One drink equals 12 oz of beer, 5 oz of wine, or 1 oz of hard liquor.  Work with your health care provider to maintain a healthy body weight or to lose weight. Ask what an ideal weight is for you.  Get at least 30 minutes of exercise that causes your heart to beat faster (aerobic exercise) most days of the week. Activities may include walking, swimming, or biking.  Work with your health care provider or diet and nutrition specialist (dietitian) to adjust your eating plan to your individual calorie needs. Reading food labels  Check food labels for the amount of sodium per serving. Choose foods with less than 5 percent of the Daily Value of sodium. Generally, foods with less than 300 mg of sodium per serving fit into this eating plan.  To find whole grains, look for the word "whole" as the first word in the ingredient list. Shopping  Buy products labeled as "low-sodium" or "no salt added."  Buy fresh foods. Avoid canned  foods and premade or frozen meals. Cooking  Avoid adding salt when cooking. Use salt-free seasonings or herbs instead of table salt or sea salt. Check with your health care provider or pharmacist before using salt substitutes.  Do not fry foods. Cook foods using healthy methods such as baking, boiling, grilling, and broiling instead.  Cook with heart-healthy oils, such as olive, canola, soybean, or sunflower oil. Meal planning   Eat a balanced diet that includes: ? 5 or more servings of fruits and vegetables each day. At each meal, try to fill half of your plate with fruits and vegetables. ? Up to 6-8 servings of whole grains each day. ? Less than 6 oz of lean meat, poultry, or fish each day. A 3-oz serving of meat is about the same size as a deck of cards. One egg equals 1 oz. ? 2 servings of low-fat dairy each day. ? A serving of nuts, seeds, or beans 5 times each week. ? Heart-healthy fats. Healthy fats called Omega-3 fatty acids are found in foods such as flaxseeds and coldwater fish, like sardines, salmon, and mackerel.  Limit how much you eat of the following: ? Canned or prepackaged foods. ? Food that is high in trans fat, such as fried foods. ? Food that is high in saturated fat, such as fatty meat. ? Sweets, desserts, sugary drinks, and other foods with added sugar. ? Full-fat dairy products.  Do not salt foods before eating.  Try to eat at least 2 vegetarian meals each week.  Eat more home-cooked food and less restaurant, buffet, and fast food.  When eating at a restaurant, ask that your food be prepared with less salt or no salt, if possible. What foods are recommended? The items listed may not be a complete list. Talk with your dietitian about what dietary choices are best for you. Grains Whole-grain or whole-wheat bread. Whole-grain or whole-wheat pasta. Brown rice. [place]. Bulgur. Whole-grain and low-sodium cereals. Pita bread. Low-fat, low-sodium crackers.  Whole-wheat flour tortillas. Vegetables Fresh or frozen vegetables (raw, steamed, roasted, or grilled). Low-sodium or reduced-sodium tomato and vegetable juice. Low-sodium or reduced-sodium tomato sauce and tomato paste. Low-sodium or reduced-sodium canned vegetables. Fruits All fresh, dried, or frozen fruit. Canned fruit in natural juice (without added sugar). Meat and other protein foods Skinless chicken or [place]. Ground chicken or [place]. Pork with fat trimmed off. Fish and seafood. Egg whites. Dried beans, peas, or lentils. Unsalted nuts, nut butters, and seeds. Unsalted canned beans. Lean cuts of beef with fat trimmed off. Low-sodium, lean deli meat. Dairy Low-fat (1%) or fat-free (skim) milk. Fat-free, low-fat, or reduced-fat cheeses. Nonfat, low-sodium ricotta or cottage cheese. Low-fat or nonfat yogurt. Low-fat, low-sodium cheese. Fats and oils Soft margarine without trans fats. Vegetable oil. Low-fat, reduced-fat, or light mayonnaise and salad dressings (reduced-sodium). Canola, safflower, olive, soybean, and sunflower oils. Avocado. Seasoning and other foods Herbs. Spices. Seasoning mixes without salt. Unsalted popcorn and pretzels. Fat-free sweets. What foods are not recommended? The items listed may not be a complete list. Talk with your dietitian about what dietary choices are best for you. Grains Baked goods made with fat, such as croissants, muffins, or some breads. Dry pasta or rice meal packs. Vegetables Creamed or fried vegetables. Vegetables in a cheese sauce. Regular canned vegetables (not low-sodium or reduced-sodium). Regular canned tomato sauce and paste (not low-sodium or reduced-sodium). Regular tomato and vegetable juice (not low-sodium or reduced-sodium). Angie Fava. Olives. Fruits Canned fruit in a light or heavy syrup. Fried fruit. Fruit in cream or butter sauce. Meat and other protein foods Fatty cuts of meat. Ribs. Fried meat. Berniece Salines. Sausage. Bologna and other  processed lunch meats. Salami. Fatback. Hotdogs. Bratwurst. Salted nuts and seeds. Canned beans with added salt. Canned or smoked fish. Whole eggs or egg yolks. Chicken or [place] with skin. Dairy Whole or 2% milk, cream, and half-and-half. Whole or full-fat cream cheese. Whole-fat or sweetened yogurt. Full-fat cheese. Nondairy creamers. Whipped toppings. Processed cheese and cheese spreads. Fats and oils Butter. Stick margarine. Lard. Shortening. Ghee. Bacon fat. Tropical oils, such as coconut, palm kernel, or palm oil. Seasoning and other foods Salted popcorn and pretzels. Onion salt, garlic salt, seasoned salt, table salt, and sea salt. Worcestershire sauce. Tartar sauce. Barbecue sauce. Teriyaki sauce. Soy sauce, including reduced-sodium. Steak sauce. Canned and packaged gravies. Fish sauce. Oyster sauce. Cocktail sauce. Horseradish that you find on the shelf. Ketchup. Mustard. Meat flavorings and tenderizers. Bouillon cubes. Hot sauce and Tabasco sauce. Premade or packaged marinades. Premade or packaged taco seasonings. Relishes. Regular salad dressings. Where to find more information:  National Heart, Lung, and [place]: https://wilson-eaton.com/  American Heart Association: www.heart.org Summary  The DASH eating plan is a healthy eating plan that has been shown to reduce high blood pressure (hypertension). It may also reduce your risk for type 2 diabetes, heart disease, and stroke.  With the DASH eating plan, you should limit salt (sodium)  intake to 2,300 mg a day. If you have hypertension, you may need to reduce your sodium intake to 1,500 mg a day.  When on the DASH eating plan, aim to eat more fresh fruits and vegetables, whole grains, lean proteins, low-fat dairy, and heart-healthy fats.  Work with your health care provider or diet and nutrition specialist (dietitian) to adjust your eating plan to your individual calorie needs. This information is not intended to replace advice given to  you by your health care provider. Make sure you discuss any questions you have with your health care provider. Document Released: 02/27/2011 Document Revised: 03/03/2016 Document Reviewed: 03/03/2016 Elsevier Interactive Patient Education  [employer].

## 2017-04-29 NOTE — Assessment & Plan Note (Signed)
BP stable. Home BP measurements consistently normotensive. Asymptomatic. Will continue current regimen. Repeat BMP today. DASH diet. Follow-up 6 months for CPE.

## 2017-04-29 NOTE — Progress Notes (Signed)
Patient presents to clinic today for follow-up of hypertension. Patient is currently on a regimen of Irbesartan 300 mg daily and Toprol XL 25 mg daily. Is taking as directed and tolerating well without side effect. Patient denies chest pain, palpitations, lightheadedness, dizziness, vision changes or frequent headaches.  BP Readings from Last 3 Encounters:  04/29/17 130/80  01/26/17 122/80  01/12/17 (!) 160/82   Past Medical History:  Diagnosis Date  . Bladder calculus   . GERD (gastroesophageal reflux disease)   . History of kidney stones   . Hypertension   . Spinal stenosis   . Urgency of urination   . Wears glasses     Current Outpatient Medications on File Prior to Visit  Medication Sig Dispense Refill  . aspirin EC 81 MG tablet Take 81 mg by mouth daily.    . Cholecalciferol (VITAMIN D3) 1000 UNITS CAPS Take 1 capsule by mouth daily.    . finasteride (PROSCAR) 5 MG tablet TAKE 1 TABLET (5 MG TOTAL) BY MOUTH DAILY. 90 tablet 0  . ibuprofen (ADVIL,MOTRIN) 200 MG tablet Take 200 mg by mouth every 6 (six) hours as needed.    . irbesartan (AVAPRO) 300 MG tablet Take 1 tablet (300 mg total) by mouth every morning. 90 tablet 1  . metoprolol succinate (TOPROL-XL) 25 MG 24 hr tablet Take 1 tablet (25 mg total) by mouth every evening. 90 tablet 1  . Multiple Vitamin (MULTIVITAMIN WITH MINERALS) TABS tablet Take 1 tablet by mouth daily.    Marland Kitchen omeprazole (PRILOSEC) 40 MG capsule Take 1 capsule (40 mg total) by mouth daily as needed (heart burn). 30 capsule 2  . tamsulosin (FLOMAX) 0.4 MG CAPS capsule Take 1 capsule (0.4 mg total) by mouth daily. 90 capsule 1  . vitamin B-12 (CYANOCOBALAMIN) 500 MCG tablet Take 500 mcg by mouth daily.     No current facility-administered medications on file prior to visit.     No Known Allergies  Family History  Problem Relation Age of Onset  . Breast cancer Mother   . Hypertension Mother   . Lung cancer Father     Social History    Socioeconomic History  . Marital status: Married    Spouse name: None  . Number of children: None  . Years of education: 69  . Highest education level: None  Social Needs  . Financial resource strain: None  . Food insecurity - worry: None  . Food insecurity - inability: None  . Transportation needs - medical: None  . Transportation needs - non-medical: None  Occupational History  . Occupation: Retired   Tobacco Use  . Smoking status: Never Smoker  . Smokeless tobacco: Never Used  Substance and Sexual Activity  . Alcohol use: Yes    Alcohol/week: 8.4 oz    Types: 14 Glasses of wine per week    Comment: 2 wine daily  . Drug use: No  . Sexual activity: Yes  Other Topics Concern  . 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  All other ROS are negative.  BP 130/80   Pulse 78   Temp (!) 97.4 F (36.3 C) (Oral)   Resp 14   Ht 5\' 8"  (1.727 m)   Wt 163 lb (73.9 kg)   SpO2 99%   BMI 24.78 kg/m   Physical Exam  Constitutional: He is oriented to person, place, and time and well-developed, well-nourished, and  in no distress.  HENT:  Head: Normocephalic and atraumatic.  Eyes: Conjunctivae are normal.  Neck: Neck supple.  Cardiovascular: Normal rate, regular rhythm, normal heart sounds and intact distal pulses.  Pulmonary/Chest: Effort normal and breath sounds normal. No respiratory distress. He has no wheezes. He has no rales. He exhibits no tenderness.  Neurological: He is alert and oriented to person, place, and time.  Skin: Skin is warm and dry. No rash noted.  Psychiatric: Affect normal.  Vitals reviewed.  Assessment/Plan: Essential hypertension BP stable. Home BP measurements consistently normotensive. Asymptomatic. Will continue current regimen. Repeat BMP today. DASH diet. Follow-up 6 months for CPE.   Gastroesophageal reflux disease without esophagitis Omeprazole refilled.      Leeanne Rio, PA-C

## 2017-04-29 NOTE — Assessment & Plan Note (Signed)
Omeprazole refilled.

## 2017-04-29 NOTE — Progress Notes (Signed)
Pt arrived for lab work. BMP, CBC-D, and EKG performed and pt sent for CXR. Dr Richarda Blade orders were not in the computer as they expire after 90 days of ordering (90 days was yesterday). Completed lab work so pt wouldn't have to wait and notified Dr Richarda Blade office that orders need to be re-submitted. Pt also given ensure presurgery drink with instruction to complete drink by 0415 DOS.

## 2017-05-03 NOTE — Anesthesia Preprocedure Evaluation (Addendum)
Anesthesia Evaluation  Patient identified by MRN, date of birth, ID band Patient awake    Reviewed: Allergy & Precautions, NPO status , Patient's Chart, lab work & pertinent test results  Airway Mallampati: II  TM Distance: >3 FB Neck ROM: Full    Dental  (+) Dental Advisory Given   Pulmonary neg pulmonary ROS,    breath sounds clear to auscultation       Cardiovascular hypertension, Pt. on medications and Pt. on home beta blockers  Rhythm:Regular Rate:Normal     Neuro/Psych negative neurological ROS     GI/Hepatic Neg liver ROS, GERD  ,  Endo/Other  negative endocrine ROS  Renal/GU negative Renal ROS     Musculoskeletal   Abdominal   Peds  Hematology negative hematology ROS (+)   Anesthesia Other Findings   Reproductive/Obstetrics                            Lab Results  Component Value Date   WBC 4.8 04/29/2017   HGB 15.5 04/29/2017   HCT 45.7 04/29/2017   MCV 97.2 04/29/2017   PLT 226 04/29/2017   Lab Results  Component Value Date   CREATININE 0.90 04/29/2017   BUN 14 04/29/2017   NA 141 04/29/2017   K 4.6 04/29/2017   CL 104 04/29/2017   CO2 26 04/29/2017    Anesthesia Physical Anesthesia Plan  ASA: II  Anesthesia Plan: General   Post-op Pain Management:  Regional for Post-op pain   Induction: Intravenous  PONV Risk Score and Plan: 2 and Ondansetron, Dexamethasone and Treatment may vary due to age or medical condition  Airway Management Planned: Oral ETT and LMA  Additional Equipment:   Intra-op Plan:   Post-operative Plan: Extubation in OR  Informed Consent: I have reviewed the patients History and Physical, chart, labs and discussed the procedure including the risks, benefits and alternatives for the proposed anesthesia with the patient or authorized representative who has indicated his/her understanding and acceptance.   Dental advisory given  Plan  Discussed with: CRNA  Anesthesia Plan Comments:        Anesthesia Quick Evaluation

## 2017-05-04 ENCOUNTER — Other Ambulatory Visit: Payer: Self-pay

## 2017-05-04 ENCOUNTER — Ambulatory Visit (HOSPITAL_BASED_OUTPATIENT_CLINIC_OR_DEPARTMENT_OTHER): Payer: Medicare Other | Admitting: Certified Registered"

## 2017-05-04 ENCOUNTER — Encounter (HOSPITAL_BASED_OUTPATIENT_CLINIC_OR_DEPARTMENT_OTHER): Payer: Self-pay | Admitting: Certified Registered"

## 2017-05-04 ENCOUNTER — Ambulatory Visit (HOSPITAL_BASED_OUTPATIENT_CLINIC_OR_DEPARTMENT_OTHER)
Admission: RE | Admit: 2017-05-04 | Discharge: 2017-05-04 | Disposition: A | Discharge status: Home / Self Care | Payer: Medicare Other | Source: Ambulatory Visit | Attending: General Surgery | Admitting: General Surgery

## 2017-05-04 ENCOUNTER — Encounter (HOSPITAL_BASED_OUTPATIENT_CLINIC_OR_DEPARTMENT_OTHER)
Admission: RE | Disposition: A | Discharge status: Home / Self Care | Payer: Self-pay | Source: Ambulatory Visit | Attending: General Surgery

## 2017-05-04 DIAGNOSIS — K4090 Unilateral inguinal hernia, without obstruction or gangrene, not specified as recurrent: Secondary | ICD-10-CM | POA: Insufficient documentation

## 2017-05-04 DIAGNOSIS — K219 Gastro-esophageal reflux disease without esophagitis: Secondary | ICD-10-CM | POA: Insufficient documentation

## 2017-05-04 DIAGNOSIS — Z79899 Other long term (current) drug therapy: Secondary | ICD-10-CM | POA: Diagnosis not present

## 2017-05-04 DIAGNOSIS — I10 Essential (primary) hypertension: Secondary | ICD-10-CM | POA: Diagnosis not present

## 2017-05-04 DIAGNOSIS — M48061 Spinal stenosis, lumbar region without neurogenic claudication: Secondary | ICD-10-CM | POA: Diagnosis not present

## 2017-05-04 DIAGNOSIS — G8918 Other acute postprocedural pain: Secondary | ICD-10-CM | POA: Diagnosis not present

## 2017-05-04 HISTORY — PX: INSERTION OF MESH: SHX5868

## 2017-05-04 HISTORY — PX: INGUINAL HERNIA REPAIR: SHX194

## 2017-05-04 SURGERY — REPAIR, HERNIA, INGUINAL, ADULT
Anesthesia: General | Site: Groin | Laterality: Right

## 2017-05-04 MED ORDER — CHLORHEXIDINE GLUCONATE CLOTH 2 % EX PADS: 6.0000 | MEDICATED_PAD | Freq: Once | CUTANEOUS | Status: DC

## 2017-05-04 MED ORDER — EPHEDRINE SULFATE 50 MG/ML IJ SOLN
INTRAMUSCULAR | Status: DC | PRN
  Administered 2017-05-04: 10 mg via INTRAVENOUS

## 2017-05-04 MED ORDER — CELECOXIB 200 MG PO CAPS
200.0000 mg | ORAL_CAPSULE | ORAL | Status: AC
  Administered 2017-05-04: 200 mg via ORAL

## 2017-05-04 MED ORDER — BUPIVACAINE HCL (PF) 0.5 % IJ SOLN
INTRAMUSCULAR | Status: DC | PRN
  Administered 2017-05-04: 10 mL

## 2017-05-04 MED ORDER — SCOPOLAMINE 1 MG/3DAYS TD PT72: 1.0000 | MEDICATED_PATCH | Freq: Once | TRANSDERMAL | Status: DC | PRN

## 2017-05-04 MED ORDER — FENTANYL CITRATE (PF) 100 MCG/2ML IJ SOLN
INTRAMUSCULAR | Status: AC
  Filled 2017-05-04: qty 2

## 2017-05-04 MED ORDER — PROPOFOL 10 MG/ML IV BOLUS
INTRAVENOUS | Status: AC
  Filled 2017-05-04: qty 20

## 2017-05-04 MED ORDER — OXYCODONE HCL 5 MG PO TABS
5.0000 mg | ORAL_TABLET | Freq: Once | ORAL | Status: AC
  Administered 2017-05-04: 5 mg via ORAL

## 2017-05-04 MED ORDER — CELECOXIB 200 MG PO CAPS
ORAL_CAPSULE | ORAL | Status: AC
  Filled 2017-05-04: qty 1

## 2017-05-04 MED ORDER — PROPOFOL 10 MG/ML IV BOLUS
INTRAVENOUS | Status: DC | PRN
  Administered 2017-05-04: 150 mg via INTRAVENOUS

## 2017-05-04 MED ORDER — DEXAMETHASONE SODIUM PHOSPHATE 10 MG/ML IJ SOLN
INTRAMUSCULAR | Status: AC
  Filled 2017-05-04: qty 1

## 2017-05-04 MED ORDER — GLYCOPYRROLATE 0.2 MG/ML IJ SOLN
INTRAMUSCULAR | Status: DC | PRN
  Administered 2017-05-04: 0.2 mg via INTRAVENOUS

## 2017-05-04 MED ORDER — PROMETHAZINE HCL 25 MG/ML IJ SOLN: 6.2500 mg | INTRAMUSCULAR | Status: DC | PRN

## 2017-05-04 MED ORDER — ONDANSETRON HCL 4 MG/2ML IJ SOLN
INTRAMUSCULAR | Status: DC | PRN
  Administered 2017-05-04: 4 mg via INTRAVENOUS

## 2017-05-04 MED ORDER — DEXAMETHASONE SODIUM PHOSPHATE 4 MG/ML IJ SOLN
INTRAMUSCULAR | Status: DC | PRN
  Administered 2017-05-04: 10 mg via INTRAVENOUS

## 2017-05-04 MED ORDER — OXYCODONE HCL 5 MG PO TABS
ORAL_TABLET | ORAL | Status: AC
  Filled 2017-05-04: qty 1

## 2017-05-04 MED ORDER — ACETAMINOPHEN 500 MG PO TABS
ORAL_TABLET | ORAL | Status: AC
  Filled 2017-05-04: qty 2

## 2017-05-04 MED ORDER — MIDAZOLAM HCL 2 MG/2ML IJ SOLN
INTRAMUSCULAR | Status: AC
  Filled 2017-05-04: qty 2

## 2017-05-04 MED ORDER — HYDROMORPHONE HCL 1 MG/ML IJ SOLN: 0.2500 mg | INTRAMUSCULAR | Status: DC | PRN

## 2017-05-04 MED ORDER — CEFAZOLIN SODIUM-DEXTROSE 2-4 GM/100ML-% IV SOLN
INTRAVENOUS | Status: AC
  Filled 2017-05-04: qty 100

## 2017-05-04 MED ORDER — ACETAMINOPHEN 500 MG PO TABS
1000.0000 mg | ORAL_TABLET | ORAL | Status: AC
  Administered 2017-05-04: 1000 mg via ORAL

## 2017-05-04 MED ORDER — BUPIVACAINE-EPINEPHRINE (PF) 0.5% -1:200000 IJ SOLN
INTRAMUSCULAR | Status: DC | PRN
  Administered 2017-05-04: 30 mL

## 2017-05-04 MED ORDER — FENTANYL CITRATE (PF) 100 MCG/2ML IJ SOLN
50.0000 ug | INTRAMUSCULAR | Status: AC | PRN
  Administered 2017-05-04: 25 ug via INTRAVENOUS
  Administered 2017-05-04: 50 ug via INTRAVENOUS
  Administered 2017-05-04: 25 ug via INTRAVENOUS
  Administered 2017-05-04: 50 ug via INTRAVENOUS
  Administered 2017-05-04 (×2): 25 ug via INTRAVENOUS

## 2017-05-04 MED ORDER — GABAPENTIN 300 MG PO CAPS
300.0000 mg | ORAL_CAPSULE | ORAL | Status: AC
  Administered 2017-05-04: 300 mg via ORAL

## 2017-05-04 MED ORDER — OXYCODONE HCL 5 MG PO TABS: 5.0000 mg | ORAL_TABLET | Freq: Four times a day (QID) | ORAL | 0 refills | Status: DC | PRN

## 2017-05-04 MED ORDER — LIDOCAINE 2% (20 MG/ML) 5 ML SYRINGE
INTRAMUSCULAR | Status: AC
  Filled 2017-05-04: qty 5

## 2017-05-04 MED ORDER — MIDAZOLAM HCL 2 MG/2ML IJ SOLN
1.0000 mg | INTRAMUSCULAR | Status: DC | PRN
  Administered 2017-05-04: 1 mg via INTRAVENOUS

## 2017-05-04 MED ORDER — CEFAZOLIN SODIUM-DEXTROSE 2-4 GM/100ML-% IV SOLN
2.0000 g | INTRAVENOUS | Status: AC
  Administered 2017-05-04: 2 g via INTRAVENOUS

## 2017-05-04 MED ORDER — GABAPENTIN 300 MG PO CAPS
ORAL_CAPSULE | ORAL | Status: AC
  Filled 2017-05-04: qty 1

## 2017-05-04 MED ORDER — SODIUM CHLORIDE 0.9 % IV SOLN
INTRAVENOUS | Status: DC | PRN
  Administered 2017-05-04: 500 mL

## 2017-05-04 MED ORDER — ONDANSETRON HCL 4 MG/2ML IJ SOLN
INTRAMUSCULAR | Status: AC
  Filled 2017-05-04: qty 2

## 2017-05-04 MED ORDER — LACTATED RINGERS IV SOLN
INTRAVENOUS | Status: DC
  Administered 2017-05-04 (×2): via INTRAVENOUS

## 2017-05-04 MED ORDER — LIDOCAINE HCL (CARDIAC) 20 MG/ML IV SOLN
INTRAVENOUS | Status: DC | PRN
  Administered 2017-05-04: 30 mg via INTRAVENOUS

## 2017-05-04 SURGICAL SUPPLY — 54 items
BAG DECANTER FOR FLEXI CONT (MISCELLANEOUS) ×3 IMPLANT
BLADE CLIPPER SURG (BLADE) ×3 IMPLANT
BLADE SURG 10 STRL SS (BLADE) ×3 IMPLANT
BLADE SURG 15 STRL LF DISP TIS (BLADE) ×2 IMPLANT
BLADE SURG 15 STRL SS (BLADE) ×4
CANISTER SUCT 1200ML W/VALVE (MISCELLANEOUS) IMPLANT
CHLORAPREP W/TINT 26ML (MISCELLANEOUS) ×3 IMPLANT
CLEANER CAUTERY TIP 5X5 PAD (MISCELLANEOUS) ×1 IMPLANT
CLOSURE WOUND 1/2 X4 (GAUZE/BANDAGES/DRESSINGS) ×1
COVER BACK TABLE 60X90IN (DRAPES) ×3 IMPLANT
COVER MAYO STAND STRL (DRAPES) ×3 IMPLANT
DECANTER SPIKE VIAL GLASS SM (MISCELLANEOUS) ×3 IMPLANT
DERMABOND ADVANCED (GAUZE/BANDAGES/DRESSINGS) ×2
DERMABOND ADVANCED .7 DNX12 (GAUZE/BANDAGES/DRESSINGS) ×1 IMPLANT
DRAIN PENROSE 1/2X12 LTX STRL (WOUND CARE) ×3 IMPLANT
DRAPE LAPAROTOMY TRNSV 102X78 (DRAPE) ×3 IMPLANT
DRAPE UTILITY XL STRL (DRAPES) ×3 IMPLANT
DRSG TEGADERM 2-3/8X2-3/4 SM (GAUZE/BANDAGES/DRESSINGS) IMPLANT
DRSG TEGADERM 4X4.75 (GAUZE/BANDAGES/DRESSINGS) ×3 IMPLANT
ELECT REM PT RETURN 9FT ADLT (ELECTROSURGICAL) ×3
ELECTRODE REM PT RTRN 9FT ADLT (ELECTROSURGICAL) ×1 IMPLANT
GLOVE BIO SURGEON STRL SZ 6.5 (GLOVE) ×2 IMPLANT
GLOVE BIO SURGEONS STRL SZ 6.5 (GLOVE) ×1
GLOVE BIOGEL PI IND STRL 8 (GLOVE) ×1 IMPLANT
GLOVE BIOGEL PI INDICATOR 8 (GLOVE) ×2
GLOVE ECLIPSE 7.5 STRL STRAW (GLOVE) ×3 IMPLANT
GOWN STRL REUS W/ TWL LRG LVL3 (GOWN DISPOSABLE) ×1 IMPLANT
GOWN STRL REUS W/ TWL XL LVL3 (GOWN DISPOSABLE) ×1 IMPLANT
GOWN STRL REUS W/TWL LRG LVL3 (GOWN DISPOSABLE) ×2
GOWN STRL REUS W/TWL XL LVL3 (GOWN DISPOSABLE) ×2
MESH HERNIA 3X6 (Mesh General) ×3 IMPLANT
NEEDLE HYPO 25X1 1.5 SAFETY (NEEDLE) ×3 IMPLANT
NS IRRIG 1000ML POUR BTL (IV SOLUTION) ×3 IMPLANT
PACK BASIN DAY SURGERY FS (CUSTOM PROCEDURE TRAY) ×3 IMPLANT
PAD CLEANER CAUTERY TIP 5X5 (MISCELLANEOUS) ×2
PENCIL BUTTON HOLSTER BLD 10FT (ELECTRODE) ×3 IMPLANT
SLEEVE SCD COMPRESS KNEE MED (MISCELLANEOUS) ×3 IMPLANT
SPONGE INTESTINAL PEANUT (DISPOSABLE) ×3 IMPLANT
SPONGE LAP 4X18 X RAY DECT (DISPOSABLE) ×3 IMPLANT
STRIP CLOSURE SKIN 1/2X4 (GAUZE/BANDAGES/DRESSINGS) ×2 IMPLANT
SUT ETHIBOND 0 MO6 C/R (SUTURE) ×3 IMPLANT
SUT MON AB 4-0 PC3 18 (SUTURE) ×3 IMPLANT
SUT PROLENE 0 CT 2 (SUTURE) ×6 IMPLANT
SUT VIC AB 3-0 SH 27 (SUTURE) ×4
SUT VIC AB 3-0 SH 27X BRD (SUTURE) ×2 IMPLANT
SUT VICRYL 4-0 PS2 18IN ABS (SUTURE) IMPLANT
SUT VICRYL AB 3 0 TIES (SUTURE) ×3 IMPLANT
SYR BULB 3OZ (MISCELLANEOUS) ×3 IMPLANT
SYR CONTROL 10ML LL (SYRINGE) ×3 IMPLANT
TOWEL OR 17X24 6PK STRL BLUE (TOWEL DISPOSABLE) ×3 IMPLANT
TOWEL OR NON WOVEN STRL DISP B (DISPOSABLE) ×3 IMPLANT
TUBE CONNECTING 20'X1/4 (TUBING)
TUBE CONNECTING 20X1/4 (TUBING) IMPLANT
YANKAUER SUCT BULB TIP NO VENT (SUCTIONS) IMPLANT

## 2017-05-04 NOTE — Anesthesia Procedure Notes (Signed)
Anesthesia Regional Block: TAP block   Pre-Anesthetic Checklist: ,, timeout performed, Correct Patient, Correct Site, Correct Laterality, Correct Procedure, Correct Position, site marked, Risks and benefits discussed,  Surgical consent,  Pre-op evaluation,  At surgeon's request and post-op pain management  Laterality: Right  Prep: chloraprep       Needles:  Injection technique: Single-shot  Needle Type: Echogenic Needle     Needle Length: 9cm  Needle Gauge: 21     Additional Needles:   Procedures:,,,, ultrasound used (permanent image in chart),,,,  Narrative:  Start time: 05/04/2017 7:22 AM End time: 05/04/2017 7:28 AM Injection made incrementally with aspirations every 5 mL.  Performed by: Personally  Anesthesiologist: Suzette Battiest, MD

## 2017-05-04 NOTE — Progress Notes (Signed)
AssistedDr. Rob Fitzgerald with right, ultrasound guided, transabdominal plane block. Side rails up, monitors on throughout procedure. See vital signs in flow sheet. Tolerated Procedure well.  

## 2017-05-04 NOTE — Discharge Instructions (Addendum)
Open Hernia Repair, Adult, Care After These instructions give you information about caring for yourself after your procedure. Your doctor may also give you more specific instructions. If you have problems or questions, contact your doctor. Follow these instructions at home: Surgical cut (incision) care   Follow instructions from your doctor about how to take care of your surgical cut area. Make sure you: ? Wash your hands with soap and water before you change your bandage (dressing). If you cannot use soap and water, use hand sanitizer. ? Change your bandage as told by your doctor. ? Leave stitches (sutures), skin glue, or skin tape (adhesive) strips in place. They may need to stay in place for 2 weeks or longer. If tape strips get loose and curl up, you may trim the loose edges. Do not remove tape strips completely unless your doctor says it is okay.  Check your surgical cut every day for signs of infection. Check for: ? More redness, swelling, or pain. ? More fluid or blood. ? Warmth. ? Pus or a bad smell. Activity  Do not drive or use heavy machinery while taking prescription pain medicine. Do not drive until your doctor says it is okay.  Until your doctor says it is okay: ? Do not lift anything that is heavier than 10 lb (4.5 kg). ? Do not play contact sports.  Return to your normal activities as told by your doctor. Ask your doctor what activities are safe. General instructions  To prevent or treat having a hard time pooping (constipation) while you are taking prescription pain medicine, your doctor may recommend that you: ? Drink enough fluid to keep your pee (urine) clear or pale yellow. ? Take over-the-counter or prescription medicines.  ? Eat foods that are high in fiber, such as fresh fruits and vegetables, whole grains, and beans. ? Limit foods that are high in fat and processed sugars, such as fried and sweet foods.  Take over-the-counter and prescription medicines only  as told by your doctor. Next Dose of Tylenol May be taken at 1:00pm  Do not take baths, swim, or use a hot tub until your doctor says it is okay.  Keep all follow-up visits as told by your doctor. This is important.  Leave the dressing intact until seen in clinic Contact a doctor if:  You develop a rash.  You have more redness, swelling, or pain around your surgical cut.  You have more fluid or blood coming from your surgical cut.  Your surgical cut feels warm to the touch.  You have pus or a bad smell coming from your surgical cut.  You have a fever or chills.  You have blood in your poop (stool).  You have not pooped in 2-3 days.  Medicine does not help your pain.  Severe, tense swelling of the groin or testicle Get help right away if:  You have chest pain or you are short of breath.  You feel light-headed.  You feel weak and dizzy (feel faint).  You have very bad pain.  You throw up (vomit) and your pain is worse. This information is not intended to replace advice given to you by your health care provider. Make sure you discuss any questions you have with your health care provider.  Kathryne Eriksson. Dahlia Bailiff, MD, [place] [phone] Surgery   Post Anesthesia Home Care Instructions  Activity: Get plenty of rest for the remainder of the day. A responsible individual must stay with you for  24 hours following the procedure.  For the next 24 hours, DO NOT: -Drive a car -Paediatric nurse -Drink alcoholic beverages -Take any medication unless instructed by your physician -Make any legal decisions or sign important papers.  Meals: Start with liquid foods such as gelatin or soup. Progress to regular foods as tolerated. Avoid greasy, spicy, heavy foods. If nausea and/or vomiting occur, drink only clear liquids until the nausea and/or vomiting subsides. Call your physician if vomiting continues.  Special  Instructions/Symptoms: Your throat may feel dry or sore from the anesthesia or the breathing tube placed in your throat during surgery. If this causes discomfort, gargle with warm salt water. The discomfort should disappear within 24 hours.  If you had a scopolamine patch placed behind your ear for the management of post- operative nausea and/or vomiting:  1. The medication in the patch is effective for 72 hours, after which it should be removed.  Wrap patch in a tissue and discard in the trash. Wash hands thoroughly with soap and water. 2. You may remove the patch earlier than 72 hours if you experience unpleasant side effects which may include dry mouth, dizziness or visual disturbances. 3. Avoid touching the patch. Wash your hands with soap and water after contact with the patch.

## 2017-05-04 NOTE — Anesthesia Procedure Notes (Signed)
Procedure Name: LMA Insertion Date/Time: 05/04/2017 7:41 AM Performed by: Signe Colt, CRNA Pre-anesthesia Checklist: Patient identified, Emergency Drugs available, Suction available and Patient being monitored Patient Re-evaluated:Patient Re-evaluated prior to induction Oxygen Delivery Method: Circle system utilized Preoxygenation: Pre-oxygenation with 100% oxygen Induction Type: IV induction Ventilation: Mask ventilation without difficulty LMA: LMA inserted LMA Size: 4.0 Number of attempts: 1 Airway Equipment and Method: Bite block Placement Confirmation: positive ETCO2 Tube secured with: Tape Dental Injury: Teeth and Oropharynx as per pre-operative assessment

## 2017-05-04 NOTE — Transfer of Care (Signed)
Immediate Anesthesia Transfer of Care Note  Patient: Anthony Collins  Procedure(s) Performed: OPEN RIGHT INGUINAL HERNIA REPAIR WITH MESH (Right Groin) INSERTION OF MESH (Right Groin)  Patient Location: PACU  Anesthesia Type:GA combined with regional for post-op pain  Level of Consciousness: awake and patient cooperative  Airway & Oxygen Therapy: Patient Spontanous Breathing and Patient connected to face mask oxygen  Post-op Assessment: Report given to RN and Post -op Vital signs reviewed and stable  Post vital signs: Reviewed and stable  Last Vitals:  Vitals:   05/04/17 0729 05/04/17 0730  BP:  (!) 146/69  Pulse: 76 66  Resp: 20 17  Temp:    SpO2: 100% 100%    Last Pain:  Vitals:   05/04/17 0730  TempSrc:   PainSc: 0-No pain      Patients Stated Pain Goal: 3 (40/37/54 3606)  Complications: No apparent anesthesia complications

## 2017-05-04 NOTE — Op Note (Signed)
OPERATIVE REPORT  DATE OF OPERATION: 05/04/2017  PATIENT:  Anthony Collins  72 y.o. male  PRE-OPERATIVE DIAGNOSIS:  Symptomatic right inguinal hernia  POST-OPERATIVE DIAGNOSIS:  Symptomatic right inguinal hernia  INDICATION(S) FOR OPERATION: Symptomatic right inguinal hernia  FINDINGS: Lipoma of the cord along with a direct inguinal hernia  PROCEDURE:  Procedure(s): OPEN RIGHT INGUINAL HERNIA REPAIR WITH MESH INSERTION OF MESH  SURGEON:  Surgeon(s): Judeth Horn, MD  ASSISTANT: None  ANESTHESIA:   general  COMPLICATIONS: None  EBL: Less than 10 ml  BLOOD ADMINISTERED: none  DRAINS: none   SPECIMEN:  No Specimen  COUNTS CORRECT:  YES  PROCEDURE DETAILS: The patient was taken to the operating room and placed on the table in the supine position.  After an adequate general laryngeal airway anesthetic was administered, he was prepped and draped in usual sterile manner supposed in the right inguinal area.  A proper timeout was performed identifying the patient and procedure to be performed.  We marked the area of the incision using a marking pen and then made a transverse curvilinear incision at the level of the superficial ring on the right side using a #10 blade.  We taken down to and through subcutaneous tissue, ligating a subcutaneous vein using a 3-0 Vicryl.  We exposed the external oblique fascia at the level of the superficial ring and then opened the superficial ring through the external oblique fascia splitting it down through the superficial ring.  We dissected out the spermatic cord at the pubic tubercle and then mobilized using a Penrose drain.  We then subsequently dissected away a lipoma of the cord there was close down towards the testicle ligated at its base using a 3-0 Vicryl tie.  We then mobilized the spermatic cord laterally and inferiorly and exposed a large direct sac.  We imbricated the sac on itself using interrupted 0 Ethibond sutures.  We subsequently  implanted an oval piece of polypropylene mesh measuring approximately 4 x 3 cm in size.  We attached it to the reflected portion of the inguinal ligament and also the conjoined tendon using a running 0 Prolene suture.  The mesh had been soaked in antibiotic solution prior to being implanted and we irrigated the canal with antibiotic solution.  Once the floor was reconstructed we allowed the spermatic cord along with the ilioinguinal nerve to fall back into the canal then reapproximated the external oblique fascia on top of cord, without entrapping the ilioinguinal nerve, using a running 3-0 Vicryl suture.  The Scarpa's fascia which is been open was closed using interrupted 3-0 Vicryl suture.  Then closed the skin after injecting the subcutaneous tissue with 0.5% Marcaine without epinephrine, using a running subcuticular stitch of 4-0 Monocryl.  All needle counts, sponge counts, and instrument counts were correct.  PATIENT DISPOSITION:  PACU - hemodynamically stable.   Judeth Horn 2/11/20198:50 AM

## 2017-05-04 NOTE — H&P (Signed)
Anthony Collins 01/27/2017 9:01 AM Location: [facility] Patient #: [id number] DOB: June 24, 1945 Married / Language: English / Race: White Male No changes in symptoms.   History of Present Illness Anthony Collins. Anthony Skains MD; 01/27/2017 9:27 AM) The patient is a 72 year old male who presents with an inguinal hernia. No changes in management were made at the last visit. Symptoms include inguinal bulge and inguinal pain. The pain is located in the right inguinal area. The pain radiates to the right hemiscrotum. The patient describes the pain as sharp and aching. Onset was gradual 1 year(s) ago. There is no known event that preceded symptom onset. The episodes occur daily. The patient describes this as moderate in severity and worsening. Symptoms are exacerbated by straining and lifting (nad standing). Symptoms are relieved by recumbency, manual reduction and use of a truss (double briefs).   Past Surgical History Anthony Collins, [place]; 01/27/2017 9:02 AM) Cataract Surgery  Bilateral. Colon Polyp Removal - Colonoscopy  Open Inguinal Hernia Surgery  Left. Tonsillectomy  Vasectomy   Diagnostic Studies History Anthony Collins, [place]; 01/27/2017 9:02 AM) Colonoscopy  5-10 years ago  Allergies Anthony Collins, RMA; 01/27/2017 9:02 AM) No Known Allergies 01/27/2017  Medication History Anthony Collins, RMA; 01/27/2017 9:05 AM) Tums (500MG  Tablet Chewable, Oral) Active. Vitamin D3 (1000UNIT Capsule, Oral) Active. Finasteride (5MG  Tablet, Oral) Active. Ibuprofen (200MG  Tablet, Oral) Active. Irbesartan (300MG  Tablet, Oral) Active. Metoprolol Succinate ER (25MG  Tablet ER 24HR, Oral) Active. Multivitamins/Minerals (Oral) Active. Omeprazole (40MG  Capsule DR, Oral) Active. SF (1.1% Gel, Dental) Active. Tamsulosin HCl (0.4MG  Capsule, Oral) Active. Vitamin B-12 (500MCG Tablet, Oral) Active. Medications Reconciled  Social History Anthony Collins, [place]; 01/27/2017 9:02  AM) Alcohol use  Moderate alcohol use. Caffeine use  Coffee. No drug use  Tobacco use  Never smoker.  Family History Anthony Collins, [place]; 01/27/2017 9:02 AM) Breast Cancer  Mother. Cancer  Father. Colon Polyps  Father. Depression  Sister. Diabetes Mellitus  Family Members In General. Hypertension  Mother. Respiratory Condition  Father, Mother. Thyroid problems  Mother.  Other Problems Anthony Collins, RMA; 01/27/2017 9:02 AM) Back Pain  Gastroesophageal Reflux Disease  High blood pressure  Inguinal Hernia  Kidney Stone     Review of Systems Anthony Collins RMA; 01/27/2017 9:02 AM) General Not Present- Appetite Loss, Chills, Fatigue, Fever, Night Sweats, Weight Gain and Weight Loss. Skin Not Present- Change in Wart/Mole, Dryness, Hives, Jaundice, New Lesions, Non-Healing Wounds, Rash and Ulcer. HEENT Not Present- Earache, Hearing Loss, Hoarseness, Nose Bleed, Oral Ulcers, Ringing in the Ears, Seasonal Allergies, Sinus Pain, Sore Throat, Visual Disturbances, Wears glasses/contact lenses and Yellow Eyes. Respiratory Not Present- Bloody sputum, Chronic Cough, Difficulty Breathing, Snoring and Wheezing. Breast Not Present- Breast Mass, Breast Pain, Nipple Discharge and Skin Changes. Cardiovascular Not Present- Chest Pain, Difficulty Breathing Lying Down, Leg Cramps, Palpitations, Rapid Heart Rate, Shortness of Breath and Swelling of Extremities. Gastrointestinal Present- Indigestion. Not Present- Abdominal Pain, Bloating, Bloody Stool, Change in Bowel Habits, Chronic diarrhea, Constipation, Difficulty Swallowing, Excessive gas, Gets full quickly at meals, Hemorrhoids, Nausea, Rectal Pain and Vomiting. Male Genitourinary Present- Frequency. Not Present- Blood in Urine, Change in Urinary Stream, Impotence, Nocturia, Painful Urination, Urgency and Urine Leakage.  Vitals Anthony Collins RMA; 01/27/2017 9:06 AM) 01/27/2017 9:05 AM Weight: 160 lb Height: 68in Body  Surface Area: 1.86 m Body Mass Index: 24.33 kg/m  Temp.: 98.82F  Pulse: 75 (Regular)  BP: 140/80 (Sitting, Left Arm, Standard)  Vital signs today:  P 72  , BP  123/66  Physical Exam Anthony Collins. Anthony Skains MD; 01/27/2017 9:28 AM) General Mental Status-Alert. General Appearance-Cooperative, Well groomed and Consistent with stated age. Orientation-Oriented X4. Build & Nutrition-Lean.  Chest and Lung Exam Chest and lung exam reveals -normal excursion with symmetric chest walls, quiet, even and easy respiratory effort with no use of accessory muscles, non-tender and normal tactile fremitus and on auscultation, normal breath sounds, no adventitious sounds and normal vocal resonance.  Cardiovascular Cardiovascular examination reveals -on palpation PMI is normal in location and amplitude, no palpable S3 or S4. Normal cardiac borders., normal heart sounds, regular rate and rhythm with no murmurs and femoral artery auscultation bilaterally reveals normal pulses, no bruits, no thrills.  Abdomen Inspection Hernias - Inguinal hernia - Right - Reducible(Previous open repair in the left inguinal area).    Assessment & Plan Anthony Collins. Anthony Geffre MD; 01/27/2017 9:33 AM) RIGHT INGUINAL HERNIA (K40.90) Story: One year of symptoms, worsening Impression: Seems like indirect RIH. No previous repair. Current Plans:  Open RIH repair with mesh Risks and benefits have been explained to the patient and he wishes  Anthony Collins. Anthony Bailiff, MD, [place] [phone] [facility]

## 2017-05-04 NOTE — Anesthesia Postprocedure Evaluation (Signed)
Anesthesia Post Note  Patient: LAVONTA TILLIS  Procedure(s) Performed: OPEN RIGHT INGUINAL HERNIA REPAIR WITH MESH (Right Groin) INSERTION OF MESH (Right Groin)     Patient location during evaluation: PACU Anesthesia Type: General Level of consciousness: awake and alert Pain management: pain level controlled Vital Signs Assessment: post-procedure vital signs reviewed and stable Respiratory status: spontaneous breathing, nonlabored ventilation, respiratory function stable and patient connected to nasal cannula oxygen Cardiovascular status: blood pressure returned to baseline and stable Postop Assessment: no apparent nausea or vomiting Anesthetic complications: no    Last Vitals:  Vitals:   05/04/17 0930 05/04/17 1018  BP: 135/74 129/60  Pulse: 81 73  Resp: 17   Temp:  36.4 C  SpO2: 95% 98%    Last Pain:  Vitals:   05/04/17 1018  TempSrc:   PainSc: 3                  Tiajuana Amass

## 2017-05-05 ENCOUNTER — Encounter (HOSPITAL_BASED_OUTPATIENT_CLINIC_OR_DEPARTMENT_OTHER): Payer: Self-pay | Admitting: General Surgery

## 2017-06-10 ENCOUNTER — Telehealth: Payer: Self-pay | Admitting: Physician Assistant

## 2017-06-10 MED FILL — SHINGRIX 50 MCG SUS: 50 | 1 days supply | Qty: 1 | Fill #0

## 2017-06-10 NOTE — Telephone Encounter (Signed)
Copied from [place] [id number]. Topic: Quick Communication - See Telephone Encounter >> Jun 10, 2017  4:43 PM [place], Laban Emperor wrote: CRM for notification. See Telephone encounter for:  Pt wife Rolan Lipa wanted to call and let the PCP know that pt did get his 2nd shingles vaccine at [facility] outpatient pharmacy today. 06/10/17.

## 2017-06-11 NOTE — Telephone Encounter (Signed)
Patient chart has been updated.

## 2017-07-03 ENCOUNTER — Other Ambulatory Visit: Payer: Self-pay

## 2017-07-03 ENCOUNTER — Encounter: Payer: Self-pay | Admitting: Physician Assistant

## 2017-07-03 ENCOUNTER — Ambulatory Visit (INDEPENDENT_AMBULATORY_CARE_PROVIDER_SITE_OTHER): Payer: Medicare Other | Admitting: Physician Assistant

## 2017-07-03 ENCOUNTER — Ambulatory Visit: Payer: Medicare Other

## 2017-07-03 VITALS — BP 122/76 | HR 75 | Temp 97.9°F | Resp 16 | Ht 68.0 in | Wt 161.0 lb

## 2017-07-03 DIAGNOSIS — L739 Follicular disorder, unspecified: Secondary | ICD-10-CM

## 2017-07-03 DIAGNOSIS — Z125 Encounter for screening for malignant neoplasm of prostate: Secondary | ICD-10-CM | POA: Diagnosis not present

## 2017-07-03 DIAGNOSIS — I10 Essential (primary) hypertension: Secondary | ICD-10-CM | POA: Diagnosis not present

## 2017-07-03 LAB — BASIC METABOLIC PANEL
BUN: 18 mg/dL (ref 6–23)
CHLORIDE: 104 meq/L (ref 96–112)
CO2: 29 meq/L (ref 19–32)
Calcium: 9.1 mg/dL (ref 8.4–10.5)
Creatinine, Ser: 0.95 mg/dL (ref 0.40–1.50)
GFR: 82.96 mL/min (ref >60.00)
Glucose, Bld: 97 mg/dL (ref 70–99)
Potassium: 4.4 mEq/L (ref 3.5–5.1)
SODIUM: 141 meq/L (ref 135–145)

## 2017-07-03 LAB — PSA, MEDICARE: PSA: 0.74 ng/ml (ref 0.10–4.00)

## 2017-07-03 MED ORDER — IRBESARTAN 300 MG PO TABS: 300.0000 mg | ORAL_TABLET | Freq: Every morning | ORAL | 1 refills | Status: DC

## 2017-07-03 MED ORDER — DOXYCYCLINE HYCLATE 100 MG PO CAPS: 100.0000 mg | ORAL_CAPSULE | Freq: Two times a day (BID) | ORAL | 0 refills | Status: DC

## 2017-07-03 MED ORDER — FINASTERIDE 5 MG PO TABS: 5.0000 mg | ORAL_TABLET | Freq: Every day | ORAL | 0 refills | Status: DC

## 2017-07-03 NOTE — Patient Instructions (Signed)
Please go to the lab today for blood work.  I will call you with your results. We will alter treatment regimen(s) if indicated by your results.   Continue current medications as directed.  I have given a script for the doxycycline to use in cases of severe folliculitis.  Try the over-the-counter hibiclens once weekly to the hairline to see if these helps cut down on recurrence.   Follow-up in August for you complete physical. Medication refills have been sent in.

## 2017-07-03 NOTE — Progress Notes (Signed)
Patient presents to clinic today for follow-up of hypertension. Patient is currently on a regimen of Irbesartan 300, Toprol XL 25 mg daily. Is taking as directed. Patient denies chest pain, palpitations, lightheadedness, dizziness, vision changes or frequent headaches.  BP Readings from Last 3 Encounters:  07/03/17 122/76  05/04/17 129/60  04/29/17 130/80   Is needing refills of his finasteride and flomax. Endorses nocturia x 1 with medication. Denies hesitancy or daytime frequency. Is overdue for PSA level.   Past Medical History:  Diagnosis Date  . Bladder calculus   . GERD (gastroesophageal reflux disease)   . History of kidney stones   . Hypertension   . Spinal stenosis   . Urgency of urination   . Wears glasses     Current Outpatient Medications on File Prior to Visit  Medication Sig Dispense Refill  . Cholecalciferol (VITAMIN D3) 1000 UNITS CAPS Take 1 capsule by mouth daily.    . finasteride (PROSCAR) 5 MG tablet TAKE 1 TABLET (5 MG TOTAL) BY MOUTH DAILY. 90 tablet 0  . ibuprofen (ADVIL,MOTRIN) 200 MG tablet Take 200 mg by mouth every 6 (six) hours as needed.    . irbesartan (AVAPRO) 300 MG tablet Take 1 tablet (300 mg total) by mouth every morning. 90 tablet 1  . metoprolol succinate (TOPROL-XL) 25 MG 24 hr tablet Take 1 tablet (25 mg total) by mouth every evening. 90 tablet 1  . Multiple Vitamin (MULTIVITAMIN WITH MINERALS) TABS tablet Take 1 tablet by mouth daily.    Marland Kitchen omeprazole (PRILOSEC) 40 MG capsule Take 1 capsule (40 mg total) by mouth daily as needed (heart burn). 90 capsule 1  . tamsulosin (FLOMAX) 0.4 MG CAPS capsule Take 1 capsule (0.4 mg total) by mouth daily. 90 capsule 1  . vitamin B-12 (CYANOCOBALAMIN) 500 MCG tablet Take 500 mcg by mouth daily.     No current facility-administered medications on file prior to visit.     No Known Allergies  Family History  Problem Relation Age of Onset  . Breast cancer Mother   . Hypertension Mother   . Lung cancer  Father     Social History   Socioeconomic History  . Marital status: Married    Spouse name: Not on file  . Number of children: Not on file  . Years of education: 58  . Highest education level: Not on file  Occupational History  . Occupation: Retired   Scientific laboratory technician  . Financial resource strain: Not on file  . Food insecurity:    Worry: Not on file    Inability: Not on file  . Transportation needs:    Medical: Not on file    Non-medical: Not on file  Tobacco Use  . Smoking status: Never Smoker  . Smokeless tobacco: Never Used  Substance and Sexual Activity  . Alcohol use: Yes    Alcohol/week: 8.4 oz    Types: 14 Glasses of wine per week    Comment: 2 wine daily  . Drug use: No  . Sexual activity: Yes  Lifestyle  . Physical activity:    Days per week: Not on file    Minutes per session: Not on file  . Stress: Not on file  Relationships  . Social connections:    Talks on phone: Not on file    Gets together: Not on file    Attends religious service: Not on file    Active member of club or organization: Not on file    Attends meetings  of clubs or organizations: Not on file    Relationship status: Not on file  Other Topics Concern  . 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  All other ROS are negative.  BP 122/76   Pulse 75   Temp 97.9 F (36.6 C) (Oral)   Resp 16   Ht '5\' 8"'  (1.727 m)   Wt 161 lb (73 kg)   SpO2 98%   BMI 24.48 kg/m   Physical Exam  Constitutional: He is oriented to person, place, and time. He appears well-developed and well-nourished.  HENT:  Head: Normocephalic and atraumatic.  Cardiovascular: Normal rate, regular rhythm and normal heart sounds.  Pulmonary/Chest: Effort normal and breath sounds normal. No stridor. No respiratory distress. He has no wheezes. He has no rales. He exhibits no tenderness.  Neurological: He is alert and oriented to  person, place, and time.  Skin: Skin is warm and dry.  Vitals reviewed.  Recent Results (from the past 2160 hour(s))  Basic metabolic panel     Status: Abnormal   Collection Time: 04/29/17  8:43 AM  Result Value Ref Range   Sodium 141 135 - 145 mEq/L   Potassium 5.0 3.5 - 5.1 mEq/L   Chloride 104 96 - 112 mEq/L   CO2 32 19 - 32 mEq/L   Glucose, Bld 104 (H) 70 - 99 mg/dL   BUN 16 6 - 23 mg/dL   Creatinine, Ser 0.95 0.40 - 1.50 mg/dL   Calcium 9.5 8.4 - 10.5 mg/dL   GFR 83.00 >60.00 mL/min  Basic metabolic panel     Status: None   Collection Time: 04/29/17 12:45 PM  Result Value Ref Range   Sodium 141 135 - 145 mmol/L   Potassium 4.6 3.5 - 5.1 mmol/L   Chloride 104 101 - 111 mmol/L   CO2 26 22 - 32 mmol/L   Glucose, Bld 96 65 - 99 mg/dL   BUN 14 6 - 20 mg/dL   Creatinine, Ser 0.90 0.61 - 1.24 mg/dL   Calcium 9.4 8.9 - 10.3 mg/dL   GFR calc non Af Amer >60 >60 mL/min   GFR calc Af Amer >60 >60 mL/min    Comment: (NOTE) The eGFR has been calculated using the CKD EPI equation. This calculation has not been validated in all clinical situations. eGFR's persistently <60 mL/min signify possible Chronic Kidney Disease.    Anion gap 11 5 - 15    Comment: Performed at [facility] [address] [place], [place] [address]  CBC WITH DIFFERENTIAL     Status: None   Collection Time: 04/29/17 12:45 PM  Result Value Ref Range   WBC 4.8 4.0 - 10.5 K/uL   RBC 4.70 4.22 - 5.81 MIL/uL   Hemoglobin 15.5 13.0 - 17.0 g/dL   HCT 45.7 39.0 - 52.0 %   MCV 97.2 78.0 - 100.0 fL   MCH 33.0 26.0 - 34.0 pg   MCHC 33.9 30.0 - 36.0 g/dL   RDW 13.1 11.5 - 15.5 %   Platelets 226 150 - 400 K/uL   Neutrophils Relative % 71 %   Neutro Abs 3.4 1.7 - 7.7 K/uL   Lymphocytes Relative 19 %   Lymphs Abs 0.9 0.7 - 4.0 K/uL   Monocytes Relative 7 %   Monocytes Absolute 0.4 0.1 - 1.0 K/uL   Eosinophils Relative 3 %   Eosinophils Absolute  0.1 0.0 - 0.7 K/uL   Basophils Relative 0 %   Basophils  Absolute 0.0 0.0 - 0.1 K/uL    Comment: Performed at [facility], [place] [address], [place], [place] [address]    Assessment/Plan: 1. Essential hypertension Stable. Medications refilled. Repeat BMP today.  - Basic metabolic panel - irbesartan (AVAPRO) 300 MG tablet; Take 1 tablet (300 mg total) by mouth every morning.  Dispense: 90 tablet; Refill: 1  2. Prostate cancer screening The natural history of prostate cancer and ongoing controversy regarding screening and potential treatment outcomes of prostate cancer has been discussed with the patient. The meaning of a false positive PSA and a false negative PSA has been discussed. He indicates understanding of the limitations of this screening test and wishes to proceed with screening PSA testing.  - PSA, Medicare  3. Folliculitis Flares occasionally. None currently. Refill of Doxy given to use for severe flares.  - doxycycline (VIBRAMYCIN) 100 MG capsule; Take 1 capsule (100 mg total) by mouth 2 (two) times daily.  Dispense: 14 capsule; Refill: 0   Leeanne Rio, PA-C

## 2017-07-05 ENCOUNTER — Other Ambulatory Visit: Payer: Self-pay | Admitting: Physician Assistant

## 2017-07-05 DIAGNOSIS — R350 Frequency of micturition: Secondary | ICD-10-CM

## 2017-07-05 DIAGNOSIS — I10 Essential (primary) hypertension: Secondary | ICD-10-CM

## 2017-07-27 DIAGNOSIS — Z125 Encounter for screening for malignant neoplasm of prostate: Secondary | ICD-10-CM | POA: Diagnosis not present

## 2017-08-03 DIAGNOSIS — N200 Calculus of kidney: Secondary | ICD-10-CM | POA: Diagnosis not present

## 2017-08-03 DIAGNOSIS — N400 Enlarged prostate without lower urinary tract symptoms: Secondary | ICD-10-CM | POA: Diagnosis not present

## 2017-10-22 ENCOUNTER — Other Ambulatory Visit: Payer: Self-pay | Admitting: Physician Assistant

## 2017-10-22 DIAGNOSIS — K219 Gastro-esophageal reflux disease without esophagitis: Secondary | ICD-10-CM

## 2017-10-28 ENCOUNTER — Ambulatory Visit: Payer: Medicare Other

## 2017-10-28 ENCOUNTER — Ambulatory Visit: Payer: Medicare Other | Admitting: Physician Assistant

## 2017-11-10 DIAGNOSIS — Z961 Presence of intraocular lens: Secondary | ICD-10-CM | POA: Diagnosis not present

## 2017-11-19 ENCOUNTER — Ambulatory Visit: Payer: Medicare Other

## 2017-11-19 DIAGNOSIS — L821 Other seborrheic keratosis: Secondary | ICD-10-CM | POA: Diagnosis not present

## 2017-11-19 DIAGNOSIS — D1801 Hemangioma of skin and subcutaneous tissue: Secondary | ICD-10-CM | POA: Diagnosis not present

## 2017-11-19 DIAGNOSIS — D225 Melanocytic nevi of trunk: Secondary | ICD-10-CM | POA: Diagnosis not present

## 2017-11-19 DIAGNOSIS — L570 Actinic keratosis: Secondary | ICD-10-CM | POA: Diagnosis not present

## 2017-11-19 DIAGNOSIS — L812 Freckles: Secondary | ICD-10-CM | POA: Diagnosis not present

## 2017-11-19 DIAGNOSIS — L738 Other specified follicular disorders: Secondary | ICD-10-CM | POA: Diagnosis not present

## 2017-11-25 NOTE — Progress Notes (Signed)
Subjective:   Anthony Collins is a 72 y.o. male who presents for Medicare Annual/Subsequent preventive examination.  Review of Systems:  No ROS.  Medicare Wellness Visit. Additional risk factors are reflected in the social history.  Cardiac Risk Factors include: advanced age (>87men, >48 women);hypertension;male gender   Sleep patterns: Sleeps 7-8 hours, interrupted often.  Home Safety/Smoke Alarms: Feels safe in home. Smoke alarms in place.  Living environment; residence and Firearm Safety: Lives with wife on 3rd floor condo (has Media planner).  Seat Belt Safety/Bike Helmet: Wears seat belt.   Male:   CCS-01/31/09, Recall 10 years. Dr. Lyndel Safe.  PSA-  Lab Results  Component Value Date   PSA 0.74 07/03/2017       Objective:    Vitals: BP 130/78   Pulse 80   Temp 98.2 F (36.8 C) (Oral)   Resp 14   Ht 5\' 8"  (1.727 m)   Wt 159 lb (72.1 kg)   SpO2 98%   BMI 24.18 kg/m   Body mass index is 24.18 kg/m.  Advanced Directives 11/26/2017 05/04/2017 04/24/2017 06/30/2016 05/02/2015 06/17/2014  Does Patient Have a Medical Advance Directive? Yes Yes Yes Yes Yes Yes  Type of Paramedic of [place];Living will [place];Living will [place];Living will [place];Living will Living will Living will;Healthcare Power of Attorney  Does patient want to make changes to medical advance directive? - No - Patient declined - - No - Patient declined -  Copy of [place] in Chart? No - copy requested No - copy requested - No - copy requested No - copy requested Yes    Tobacco Social History   Tobacco Use  Smoking Status Never Smoker  Smokeless Tobacco Never Used     Counseling given: Not Answered   Past Medical History:  Diagnosis Date  . Bladder calculus   . GERD (gastroesophageal reflux disease)   . History of kidney stones   . Hypertension   . Spinal stenosis   . Urgency of urination   .  Wears glasses    Past Surgical History:  Procedure Laterality Date  . BLEPHAROPLASTY    . EXTRACORPOREAL SHOCK WAVE LITHOTRIPSY  2004  . EYE SURGERY    . INGUINAL HERNIA REPAIR Left 1998  (spinal anesthesia)  . INGUINAL HERNIA REPAIR Right 05/04/2017   Procedure: OPEN RIGHT INGUINAL HERNIA REPAIR WITH MESH;  Surgeon: Judeth Horn, MD;  Location: [facility];  Service: General;  Laterality: Right;  . INSERTION OF MESH Right 05/04/2017   Procedure: INSERTION OF MESH;  Surgeon: Judeth Horn, MD;  Location: [facility];  Service: General;  Laterality: Right;  . TONSILLECTOMY  age 60   Family History  Problem Relation Age of Onset  . Breast cancer Mother   . Hypertension Mother   . Lung cancer Father    Social History   Socioeconomic History  . Marital status: Married    Spouse name: Not on file  . Number of children: Not on file  . Years of education: 50  . Highest education level: Not on file  Occupational History  . Occupation: Retired   Scientific laboratory technician  . Financial resource strain: Not on file  . Food insecurity:    Worry: Not on file    Inability: Not on file  . Transportation needs:    Medical: Not on file    Non-medical: Not on file  Tobacco Use  . Smoking  status: Never Smoker  . Smokeless tobacco: Never Used  Substance and Sexual Activity  . Alcohol use: Yes    Alcohol/week: 14.0 standard drinks    Types: 14 Glasses of wine per week    Comment: 2 wine daily  . Drug use: No  . Sexual activity: Yes  Lifestyle  . Physical activity:    Days per week: Not on file    Minutes per session: Not on file  . Stress: Not on file  Relationships  . Social connections:    Talks on phone: Not on file    Gets together: Not on file    Attends religious service: Not on file    Active member of club or organization: Not on file    Attends meetings of clubs or organizations: Not on file    Relationship status: Not on file  Other Topics Concern  . Not  on file  Social History Narrative   Fun/Hobby: Exercise, Baseball card collector, 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    Outpatient Encounter Medications as of 11/26/2017  Medication Sig  . Cholecalciferol (VITAMIN D3) 1000 UNITS CAPS Take 1 capsule by mouth daily.  Marland Kitchen ibuprofen (ADVIL,MOTRIN) 200 MG tablet Take 200 mg by mouth every 6 (six) hours as needed.  . irbesartan (AVAPRO) 300 MG tablet Take 1 tablet (300 mg total) by mouth every morning.  . metoprolol succinate (TOPROL-XL) 25 MG 24 hr tablet TAKE 1 TABLET (25 MG TOTAL) BY MOUTH EVERY EVENING.  . Multiple Vitamin (MULTIVITAMIN WITH MINERALS) TABS tablet Take 1 tablet by mouth daily.  Marland Kitchen omeprazole (PRILOSEC) 40 MG capsule TAKE 1 CAPSULE (40 MG TOTAL) BY MOUTH DAILY AS NEEDED (HEART BURN).  . vitamin B-12 (CYANOCOBALAMIN) 500 MCG tablet Take 500 mcg by mouth daily.  . [DISCONTINUED] doxycycline (VIBRAMYCIN) 100 MG capsule Take 1 capsule (100 mg total) by mouth 2 (two) times daily.  . [DISCONTINUED] finasteride (PROSCAR) 5 MG tablet Take 1 tablet (5 mg total) by mouth daily.  . [DISCONTINUED] irbesartan (AVAPRO) 300 MG tablet Take 1 tablet (300 mg total) by mouth every morning.  . [DISCONTINUED] tamsulosin (FLOMAX) 0.4 MG CAPS capsule TAKE 1 CAPSULE BY MOUTH EVERY DAY   No facility-administered encounter medications on file as of 11/26/2017.     Activities of Daily Living In your present state of health, do you have any difficulty performing the following activities: 11/26/2017 11/26/2017  Hearing? N N  Vision? N N  Difficulty concentrating or making decisions? N N  Walking or climbing stairs? N N  Dressing or bathing? N N  Doing errands, shopping? N N  Preparing Food and eating ? N -  Using the Toilet? N -  In the past six months, have you accidently leaked urine? N -  Do you have problems with loss of bowel control? N -  Managing your Medications? N -  Managing your Finances? N -  Housekeeping or managing your Housekeeping? N -  Some recent data  might be hidden    Patient Care Team: Delorse Limber as PCP - General (Family Medicine) Nickie Retort, MD as Consulting Physician (Urology) Jarome Matin, MD as Consulting Physician (Dermatology) Luberta Mutter, MD as Consulting Physician (Ophthalmology) Royston Cowper, [place] (Dentistry) Lorenda Cahill, DMD (Dentistry) Marybelle Killings, MD as Consulting Physician (Orthopedic Surgery)   Assessment:   This is a routine wellness examination for [place].  Exercise Activities and Dietary recommendations Current Exercise Habits: Home exercise routine, Type of exercise: walking, Time (Minutes): 60, Frequency (Times/Week): 7,  Weekly Exercise (Minutes/Week): 420, Exercise limited by: None identified   Diet (meal preparation, eat out, water intake, caffeinated beverages, dairy products, fruits and vegetables): Drinks water and wine.   Breakfast: eggs; cereal; ham biscuit; bacon; coffee (2 cups0 Lunch: Occasionally skips; leftovers Dinner: Protein and vegetables.   Goals    . Stay healthy and enjoy my family     Continue to eat healthy, exercise, decrease stress by making a list at night and using stress reduction techniques       Fall Risk Fall Risk  11/26/2017 07/03/2017 06/30/2016 06/09/2016 11/16/2015  Falls in the past year? No No No No No  Comment - - - - Emmi Telephone Survey: data to providers prior to load    Depression Screen PHQ 2/9 Scores 11/26/2017 11/26/2017 07/03/2017 01/12/2017  PHQ - 2 Score 0 0 0 0  PHQ- 9 Score - 0 - 0    Cognitive Function MMSE - Mini Mental State Exam 11/26/2017  Orientation to time 5  Orientation to Place 5  Registration 3  Attention/ Calculation 3  Recall 3  Language- name 2 objects 2  Language- repeat 1  Language- follow 3 step command 3  Language- read & follow direction 1  Write a sentence 1  Copy design 1  Total score 28        Immunization History  Administered Date(s) Administered  . Influenza, High Dose Seasonal PF 01/12/2017   . Pneumococcal Conjugate-13 11/26/2017  . Tdap 06/06/2011  . Zoster Recombinat (Shingrix) 01/12/2017, 06/10/2017    Screening Tests Health Maintenance  Topic Date Due  . INFLUENZA VACCINE  10/22/2017  . PNA vac Low Risk Adult (2 of 2 - PPSV23) 11/27/2018  . COLONOSCOPY  02/01/2019  . DTaP/Tdap/Td (2 - Td) 06/05/2021  . TETANUS/TDAP  06/05/2021  . Hepatitis C Screening  Completed        Plan:    Bring a copy of your living will and/or healthcare power of attorney to your next office visit.  Continue doing brain stimulating activities (puzzles, reading, adult coloring books, staying active) to keep memory sharp.   [email or web address]  I have personally reviewed and noted the following in the patient's chart:   . Medical and social history . Use of alcohol, tobacco or illicit drugs  . Current medications and supplements . Functional ability and status . Nutritional status . Physical activity . Advanced directives . List of other physicians . Hospitalizations, surgeries, and ER visits in previous 12 months . Vitals . Screenings to include cognitive, depression, and falls . Referrals and appointments  In addition, I have reviewed and discussed with patient certain preventive protocols, quality metrics, and best practice recommendations. A written personalized care plan for preventive services as well as general preventive health recommendations were provided to patient.     Gerilyn Nestle, RN  11/26/2017

## 2017-11-26 ENCOUNTER — Encounter: Payer: Self-pay | Admitting: Physician Assistant

## 2017-11-26 ENCOUNTER — Other Ambulatory Visit: Payer: Self-pay

## 2017-11-26 ENCOUNTER — Ambulatory Visit (INDEPENDENT_AMBULATORY_CARE_PROVIDER_SITE_OTHER): Payer: Medicare Other

## 2017-11-26 ENCOUNTER — Ambulatory Visit (INDEPENDENT_AMBULATORY_CARE_PROVIDER_SITE_OTHER): Payer: Medicare Other | Admitting: Physician Assistant

## 2017-11-26 VITALS — BP 130/78 | HR 80 | Temp 98.2°F | Resp 14 | Ht 68.0 in | Wt 159.0 lb

## 2017-11-26 DIAGNOSIS — Z23 Encounter for immunization: Secondary | ICD-10-CM | POA: Diagnosis not present

## 2017-11-26 DIAGNOSIS — R350 Frequency of micturition: Secondary | ICD-10-CM

## 2017-11-26 DIAGNOSIS — L219 Seborrheic dermatitis, unspecified: Secondary | ICD-10-CM | POA: Diagnosis not present

## 2017-11-26 DIAGNOSIS — Z0000 Encounter for general adult medical examination without abnormal findings: Secondary | ICD-10-CM

## 2017-11-26 DIAGNOSIS — R0681 Apnea, not elsewhere classified: Secondary | ICD-10-CM

## 2017-11-26 DIAGNOSIS — I10 Essential (primary) hypertension: Secondary | ICD-10-CM | POA: Diagnosis not present

## 2017-11-26 LAB — LIPID PANEL
CHOLESTEROL: 154 mg/dL (ref 0–200)
HDL: 78 mg/dL (ref >39.00)
LDL Cholesterol: 68 mg/dL (ref 0–99)
NONHDL: 75.64
Total CHOL/HDL Ratio: 2
Triglycerides: 36 mg/dL (ref 0.0–149.0)
VLDL: 7.2 mg/dL (ref 0.0–40.0)

## 2017-11-26 LAB — COMPREHENSIVE METABOLIC PANEL
ALBUMIN: 4.3 g/dL (ref 3.5–5.2)
ALK PHOS: 51 U/L (ref 39–117)
ALT: 15 U/L (ref 0–53)
AST: 18 U/L (ref 0–37)
BUN: 18 mg/dL (ref 6–23)
CO2: 31 mEq/L (ref 19–32)
CREATININE: 0.94 mg/dL (ref 0.40–1.50)
Calcium: 9.4 mg/dL (ref 8.4–10.5)
Chloride: 103 mEq/L (ref 96–112)
GFR: 83.88 mL/min (ref >60.00)
Glucose, Bld: 91 mg/dL (ref 70–99)
Potassium: 5 mEq/L (ref 3.5–5.1)
SODIUM: 140 meq/L (ref 135–145)
TOTAL PROTEIN: 7 g/dL (ref 6.0–8.3)
Total Bilirubin: 1 mg/dL (ref 0.2–1.2)

## 2017-11-26 MED ORDER — IRBESARTAN 300 MG PO TABS: 300.0000 mg | ORAL_TABLET | Freq: Every morning | ORAL | 1 refills | Status: DC

## 2017-11-26 MED ORDER — FINASTERIDE 5 MG PO TABS: 5.0000 mg | ORAL_TABLET | Freq: Every day | ORAL | 1 refills | Status: DC

## 2017-11-26 MED ORDER — TAMSULOSIN HCL 0.4 MG PO CAPS: 0.4000 mg | ORAL_CAPSULE | Freq: Every day | ORAL | 1 refills | Status: DC

## 2017-11-26 NOTE — Progress Notes (Signed)
Patient presents to clinic today for follow-up of hypertension. Also has acute concerns today.  Patient is currently on a regimen of Toprol XL 25 mg Qd. Endorses taking as directed. Patient denies chest pain, palpitations, lightheadedness, dizziness, vision changes or frequent headaches.  BP Readings from Last 3 Encounters:  11/26/17 130/78  11/26/17 130/78  07/03/17 122/76   Patient also endorses itchy, scaling rash above his R eye, sometimes involving the eyelid. Is improved with Cetaphil moisturizers. Notes dandruff of scalp.  Patient also requesting sleep study. Has noted excess snoring at night and waking up several times throughout the night. Wife has noted apneic episodes when napping.   Past Medical History:  Diagnosis Date  . Bladder calculus   . GERD (gastroesophageal reflux disease)   . History of kidney stones   . Hypertension   . Spinal stenosis   . Urgency of urination   . Wears glasses     Current Outpatient Medications on File Prior to Visit  Medication Sig Dispense Refill  . Cholecalciferol (VITAMIN D3) 1000 UNITS CAPS Take 1 capsule by mouth daily.    Marland Kitchen ibuprofen (ADVIL,MOTRIN) 200 MG tablet Take 200 mg by mouth every 6 (six) hours as needed.    . metoprolol succinate (TOPROL-XL) 25 MG 24 hr tablet TAKE 1 TABLET (25 MG TOTAL) BY MOUTH EVERY EVENING. 90 tablet 1  . Multiple Vitamin (MULTIVITAMIN WITH MINERALS) TABS tablet Take 1 tablet by mouth daily.    Marland Kitchen omeprazole (PRILOSEC) 40 MG capsule TAKE 1 CAPSULE (40 MG TOTAL) BY MOUTH DAILY AS NEEDED (HEART BURN). 90 capsule 1  . vitamin B-12 (CYANOCOBALAMIN) 500 MCG tablet Take 500 mcg by mouth daily.     No current facility-administered medications on file prior to visit.     No Known Allergies  Family History  Problem Relation Age of Onset  . Breast cancer Mother   . Hypertension Mother   . Lung cancer Father     Social History   Socioeconomic History  . Marital status: Married    Spouse name: Not on  file  . Number of children: Not on file  . Years of education: 69  . Highest education level: Not on file  Occupational History  . Occupation: Retired   Scientific laboratory technician  . Financial resource strain: Not on file  . Food insecurity:    Worry: Not on file    Inability: Not on file  . Transportation needs:    Medical: Not on file    Non-medical: Not on file  Tobacco Use  . Smoking status: Never Smoker  . Smokeless tobacco: Never Used  Substance and Sexual Activity  . Alcohol use: Yes    Alcohol/week: 14.0 standard drinks    Types: 14 Glasses of wine per week    Comment: 2 wine daily  . Drug use: No  . Sexual activity: Yes  Lifestyle  . Physical activity:    Days per week: Not on file    Minutes per session: Not on file  . Stress: Not on file  Relationships  . Social connections:    Talks on phone: Not on file    Gets together: Not on file    Attends religious service: Not on file    Active member of club or organization: Not on file    Attends meetings of clubs or organizations: Not on file    Relationship status: Not on file  Other Topics Concern  . Not on file  Social History Narrative  Fun/Hobby: Exercise, Baseball card collector, 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   Review of Systems - See HPI.  All other ROS are negative.  BP 130/78   Pulse 80   Temp 98.2 F (36.8 C) (Oral)   Resp 14   Ht _0  (1.727 m)   Wt 159 lb (72.1 kg)   SpO2 98%   BMI 24.18 kg/m   Physical Exam  Constitutional: He is oriented to person, place, and time. He appears well-developed and well-nourished.  HENT:  Head: Normocephalic and atraumatic.  Eyes: Conjunctivae are normal.  Neck: Neck supple.  Cardiovascular: Normal rate, regular rhythm, normal heart sounds and intact distal pulses.  Pulmonary/Chest: Effort normal and breath sounds normal.  Neurological: He is alert and oriented to person, place, and time.  Psychiatric: He has a normal mood and affect.  Vitals reviewed.  Assessment/Plan: 1.  Essential hypertension BP stable. Asymptomatic. Continue current regimen. Will check labs today. - irbesartan (AVAPRO) 300 MG tablet; Take 1 tablet (300 mg total) by mouth every morning.  Dispense: 90 tablet; Refill: 1 - Comp Met (CMET) - Lipid panel  2. Urinary frequency 2/2 BPH. Doing very well. PSA stable. Continue medications. - tamsulosin (FLOMAX) 0.4 MG CAPS capsule; Take 1 capsule (0.4 mg total) by mouth daily.  Dispense: 90 capsule; Refill: 1  3. Apneic episode Noted by patient and wife. Will order home sleep study for assessment.  - Home sleep test  4. Seborrheic dermatitis Supportive measures reviewed for mild seborrhea. If not improving, will consider prescription options.   5. Need for vaccination against Streptococcus pneumoniae using pneumococcal conjugate vaccine 13 Prevnar given today - Pneumococcal conjugate vaccine 13-valent IM   Leeanne Rio, PA-C

## 2017-11-26 NOTE — Progress Notes (Signed)
RN [place] note reviewed today.  Leeanne Rio, PA-C

## 2017-11-26 NOTE — Patient Instructions (Addendum)
Please go to the lab today for blood work.  I will call you with your results. We will alter treatment regimen(s) if indicated by your results.   Please continue current medication regimen.  You will be contacted to schedule a home sleep study. If you do not hear from them within a week, please let me know.  We have updated your pneumonia vaccine today.  You will get the other in 1 year.  Please schedule your Flu shot in [place] if you are wanting to wait until then!   DASH Eating Plan DASH stands for "Dietary Approaches to Stop Hypertension." The DASH eating plan is a healthy eating plan that has been shown to reduce high blood pressure (hypertension). It may also reduce your risk for type 2 diabetes, heart disease, and stroke. The DASH eating plan may also help with weight loss. What are tips for following this plan? General guidelines  Avoid eating more than 2,300 mg (milligrams) of salt (sodium) a day. If you have hypertension, you may need to reduce your sodium intake to 1,500 mg a day.  Limit alcohol intake to no more than 1 drink a day for nonpregnant women and 2 drinks a day for men. One drink equals 12 oz of beer, 5 oz of wine, or 1 oz of hard liquor.  Work with your health care provider to maintain a healthy body weight or to lose weight. Ask what an ideal weight is for you.  Get at least 30 minutes of exercise that causes your heart to beat faster (aerobic exercise) most days of the week. Activities may include walking, swimming, or biking.  Work with your health care provider or diet and nutrition specialist (dietitian) to adjust your eating plan to your individual calorie needs. Reading food labels  Check food labels for the amount of sodium per serving. Choose foods with less than 5 percent of the Daily Value of sodium. Generally, foods with less than 300 mg of sodium per serving fit into this eating plan.  To find whole grains, look for the word "whole" as  the first word in the ingredient list. Shopping  Buy products labeled as "low-sodium" or "no salt added."  Buy fresh foods. Avoid canned foods and premade or frozen meals. Cooking  Avoid adding salt when cooking. Use salt-free seasonings or herbs instead of table salt or sea salt. Check with your health care provider or pharmacist before using salt substitutes.  Do not fry foods. Cook foods using healthy methods such as baking, boiling, grilling, and broiling instead.  Cook with heart-healthy oils, such as olive, canola, soybean, or sunflower oil. Meal planning   Eat a balanced diet that includes: ? 5 or more servings of fruits and vegetables each day. At each meal, try to fill half of your plate with fruits and vegetables. ? Up to 6-8 servings of whole grains each day. ? Less than 6 oz of lean meat, poultry, or fish each day. A 3-oz serving of meat is about the same size as a deck of cards. One egg equals 1 oz. ? 2 servings of low-fat dairy each day. ? A serving of nuts, seeds, or beans 5 times each week. ? Heart-healthy fats. Healthy fats called Omega-3 fatty acids are found in foods such as flaxseeds and coldwater fish, like sardines, salmon, and mackerel.  Limit how much you eat of the following: ? Canned or prepackaged foods. ? Food that is high in trans fat, such as fried foods. ? Food  that is high in saturated fat, such as fatty meat. ? Sweets, desserts, sugary drinks, and other foods with added sugar. ? Full-fat dairy products.  Do not salt foods before eating.  Try to eat at least 2 vegetarian meals each week.  Eat more home-cooked food and less restaurant, buffet, and fast food.  When eating at a restaurant, ask that your food be prepared with less salt or no salt, if possible. What foods are recommended? The items listed may not be a complete list. Talk with your dietitian about what dietary choices are best for you. Grains Whole-grain or whole-wheat bread.  Whole-grain or whole-wheat pasta. Brown rice. [place]. Bulgur. Whole-grain and low-sodium cereals. Pita bread. Low-fat, low-sodium crackers. Whole-wheat flour tortillas. Vegetables Fresh or frozen vegetables (raw, steamed, roasted, or grilled). Low-sodium or reduced-sodium tomato and vegetable juice. Low-sodium or reduced-sodium tomato sauce and tomato paste. Low-sodium or reduced-sodium canned vegetables. Fruits All fresh, dried, or frozen fruit. Canned fruit in natural juice (without added sugar). Meat and other protein foods Skinless chicken or [place]. Ground chicken or [place]. Pork with fat trimmed off. Fish and seafood. Egg whites. Dried beans, peas, or lentils. Unsalted nuts, nut butters, and seeds. Unsalted canned beans. Lean cuts of beef with fat trimmed off. Low-sodium, lean deli meat. Dairy Low-fat (1%) or fat-free (skim) milk. Fat-free, low-fat, or reduced-fat cheeses. Nonfat, low-sodium ricotta or cottage cheese. Low-fat or nonfat yogurt. Low-fat, low-sodium cheese. Fats and oils Soft margarine without trans fats. Vegetable oil. Low-fat, reduced-fat, or light mayonnaise and salad dressings (reduced-sodium). Canola, safflower, olive, soybean, and sunflower oils. Avocado. Seasoning and other foods Herbs. Spices. Seasoning mixes without salt. Unsalted popcorn and pretzels. Fat-free sweets. What foods are not recommended? The items listed may not be a complete list. Talk with your dietitian about what dietary choices are best for you. Grains Baked goods made with fat, such as croissants, muffins, or some breads. Dry pasta or rice meal packs. Vegetables Creamed or fried vegetables. Vegetables in a cheese sauce. Regular canned vegetables (not low-sodium or reduced-sodium). Regular canned tomato sauce and paste (not low-sodium or reduced-sodium). Regular tomato and vegetable juice (not low-sodium or reduced-sodium). Angie Fava. Olives. Fruits Canned fruit in a light or heavy syrup.  Fried fruit. Fruit in cream or butter sauce. Meat and other protein foods Fatty cuts of meat. Ribs. Fried meat. Berniece Salines. Sausage. Bologna and other processed lunch meats. Salami. Fatback. Hotdogs. Bratwurst. Salted nuts and seeds. Canned beans with added salt. Canned or smoked fish. Whole eggs or egg yolks. Chicken or [place] with skin. Dairy Whole or 2% milk, cream, and half-and-half. Whole or full-fat cream cheese. Whole-fat or sweetened yogurt. Full-fat cheese. Nondairy creamers. Whipped toppings. Processed cheese and cheese spreads. Fats and oils Butter. Stick margarine. Lard. Shortening. Ghee. Bacon fat. Tropical oils, such as coconut, palm kernel, or palm oil. Seasoning and other foods Salted popcorn and pretzels. Onion salt, garlic salt, seasoned salt, table salt, and sea salt. Worcestershire sauce. Tartar sauce. Barbecue sauce. Teriyaki sauce. Soy sauce, including reduced-sodium. Steak sauce. Canned and packaged gravies. Fish sauce. Oyster sauce. Cocktail sauce. Horseradish that you find on the shelf. Ketchup. Mustard. Meat flavorings and tenderizers. Bouillon cubes. Hot sauce and Tabasco sauce. Premade or packaged marinades. Premade or packaged taco seasonings. Relishes. Regular salad dressings. Where to find more information:  National Heart, Lung, and [place]: https://wilson-eaton.com/  American Heart Association: www.heart.org Summary  The DASH eating plan is a healthy eating plan that has been shown to reduce high blood pressure (  hypertension). It may also reduce your risk for type 2 diabetes, heart disease, and stroke.  With the DASH eating plan, you should limit salt (sodium) intake to 2,300 mg a day. If you have hypertension, you may need to reduce your sodium intake to 1,500 mg a day.  When on the DASH eating plan, aim to eat more fresh fruits and vegetables, whole grains, lean proteins, low-fat dairy, and heart-healthy fats.  Work with your health care provider or diet and  nutrition specialist (dietitian) to adjust your eating plan to your individual calorie needs. This information is not intended to replace advice given to you by your health care provider. Make sure you discuss any questions you have with your health care provider. Document Released: 02/27/2011 Document Revised: 03/03/2016 Document Reviewed: 03/03/2016 Elsevier Interactive Patient Education  [employer].

## 2017-11-26 NOTE — Patient Instructions (Addendum)
Bring a copy of your living will and/or healthcare power of attorney to your next office visit.  Continue doing brain stimulating activities (puzzles, reading, adult coloring books, staying active) to keep memory sharp.   Www.aarp.com   Health Maintenance, Male A healthy lifestyle and preventive care is important for your health and wellness. Ask your health care provider about what schedule of regular examinations is right for you. What should I know about weight and diet? Eat a Healthy Diet  Eat plenty of vegetables, fruits, whole grains, low-fat dairy products, and lean protein.  Do not eat a lot of foods high in solid fats, added sugars, or salt.  Maintain a Healthy Weight Regular exercise can help you achieve or maintain a healthy weight. You should:  Do at least 150 minutes of exercise each week. The exercise should increase your heart rate and make you sweat (moderate-intensity exercise).  Do strength-training exercises at least twice a week.  Watch Your Levels of Cholesterol and Blood Lipids  Have your blood tested for lipids and cholesterol every 5 years starting at 72 years of age. If you are at high risk for heart disease, you should start having your blood tested when you are 72 years old. You may need to have your cholesterol levels checked more often if: ? Your lipid or cholesterol levels are high. ? You are older than 72 years of age. ? You are at high risk for heart disease.  What should I know about cancer screening? Many types of cancers can be detected early and may often be prevented. Lung Cancer  You should be screened every year for lung cancer if: ? You are a current smoker who has smoked for at least 30 years. ? You are a former smoker who has quit within the past 15 years.  Talk to your health care provider about your screening options, when you should start screening, and how often you should be screened.  Colorectal Cancer  Routine colorectal  cancer screening usually begins at 72 years of age and should be repeated every 5-10 years until you are 72 years old. You may need to be screened more often if early forms of precancerous polyps or small growths are found. Your health care provider may recommend screening at an earlier age if you have risk factors for colon cancer.  Your health care provider may recommend using home test kits to check for hidden blood in the stool.  A small camera at the end of a tube can be used to examine your colon (sigmoidoscopy or colonoscopy). This checks for the earliest forms of colorectal cancer.  Prostate and Testicular Cancer  Depending on your age and overall health, your health care provider may do certain tests to screen for prostate and testicular cancer.  Talk to your health care provider about any symptoms or concerns you have about testicular or prostate cancer.  Skin Cancer  Check your skin from head to toe regularly.  Tell your health care provider about any new moles or changes in moles, especially if: ? There is a change in a mole's size, shape, or color. ? You have a mole that is larger than a pencil eraser.  Always use sunscreen. Apply sunscreen liberally and repeat throughout the day.  Protect yourself by wearing long sleeves, pants, a wide-brimmed hat, and sunglasses when outside.  What should I know about heart disease, diabetes, and high blood pressure?  If you are 59-67 years of age, have your blood pressure  checked every 3-5 years. If you are 44 years of age or older, have your blood pressure checked every year. You should have your blood pressure measured twice-once when you are at a hospital or clinic, and once when you are not at a hospital or clinic. Record the average of the two measurements. To check your blood pressure when you are not at a hospital or clinic, you can use: ? An automated blood pressure machine at a pharmacy. ? A home blood pressure monitor.  Talk to  your health care provider about your target blood pressure.  If you are between 51-35 years old, ask your health care provider if you should take aspirin to prevent heart disease.  Have regular diabetes screenings by checking your fasting blood sugar level. ? If you are at a normal weight and have a low risk for diabetes, have this test once every three years after the age of 79. ? If you are overweight and have a high risk for diabetes, consider being tested at a younger age or more often.  A one-time screening for abdominal aortic aneurysm (AAA) by ultrasound is recommended for men aged 26-75 years who are current or former smokers. What should I know about preventing infection? Hepatitis B If you have a higher risk for hepatitis B, you should be screened for this virus. Talk with your health care provider to find out if you are at risk for hepatitis B infection. Hepatitis C Blood testing is recommended for:  Everyone born from [birth year] through 1965.  Anyone with known risk factors for hepatitis C.  Sexually Transmitted Diseases (STDs)  You should be screened each year for STDs including gonorrhea and chlamydia if: ? You are sexually active and are younger than 72 years of age. ? You are older than 72 years of age and your health care provider tells you that you are at risk for this type of infection. ? Your sexual activity has changed since you were last screened and you are at an increased risk for chlamydia or gonorrhea. Ask your health care provider if you are at risk.  Talk with your health care provider about whether you are at high risk of being infected with HIV. Your health care provider may recommend a prescription medicine to help prevent HIV infection.  What else can I do?  Schedule regular health, dental, and eye exams.  Stay current with your vaccines (immunizations).  Do not use any tobacco products, such as cigarettes, chewing tobacco, and e-cigarettes. If you need  help quitting, ask your health care provider.  Limit alcohol intake to no more than 2 drinks per day. One drink equals 12 ounces of beer, 5 ounces of wine, or 1 ounces of hard liquor.  Do not use street drugs.  Do not share needles.  Ask your health care provider for help if you need support or information about quitting drugs.  Tell your health care provider if you often feel depressed.  Tell your health care provider if you have ever been abused or do not feel safe at home. This information is not intended to replace advice given to you by your health care provider. Make sure you discuss any questions you have with your health care provider. Document Released: 09/06/2007 Document Revised: 11/07/2015 Document Reviewed: 12/12/2014 Elsevier Interactive Patient Education  [employer].

## 2017-12-14 ENCOUNTER — Ambulatory Visit (HOSPITAL_BASED_OUTPATIENT_CLINIC_OR_DEPARTMENT_OTHER): Payer: Medicare Other | Attending: Physician Assistant | Admitting: Internal Medicine

## 2017-12-14 VITALS — Ht 68.0 in | Wt 160.0 lb

## 2017-12-14 DIAGNOSIS — R0902 Hypoxemia: Secondary | ICD-10-CM | POA: Insufficient documentation

## 2017-12-14 DIAGNOSIS — R0681 Apnea, not elsewhere classified: Secondary | ICD-10-CM | POA: Diagnosis present

## 2017-12-14 DIAGNOSIS — G4733 Obstructive sleep apnea (adult) (pediatric): Secondary | ICD-10-CM

## 2017-12-25 ENCOUNTER — Telehealth: Payer: Self-pay | Admitting: Physician Assistant

## 2017-12-25 NOTE — Telephone Encounter (Signed)
Copied from [place] [id number]. Topic: General - Other >> Dec 25, 2017  9:50 AM Yvette Rack wrote: Reason for CRM: pt calling for sleep study results

## 2017-12-25 NOTE — Telephone Encounter (Signed)
Advised patient the sleep study results are not ready yet. The sleep specialist has not read the results yet. Once they are resulted will contact patient with results. He is agreeable.

## 2017-12-26 DIAGNOSIS — G4733 Obstructive sleep apnea (adult) (pediatric): Secondary | ICD-10-CM

## 2017-12-26 NOTE — Procedures (Signed)
   Patient Name: Anthony Collins, Anthony Collins Date: 12/14/2017 Gender: Male D.O.B: 1945/08/07 Age (years): 64 Referring Provider: Brunetta Jeans PA-C Height (inches): 47 Interpreting Physician: Baird Lyons MD, ABSM Weight (lbs): 160 RPSGT: Jacolyn Reedy BMI: 24 MRN: [id number] Neck Size: 17.00  CLINICAL INFORMATION Sleep Study Type: HST Indication for sleep study: OSA Epworth Sleepiness Score: 8  SLEEP STUDY TECHNIQUE A multi-channel overnight portable sleep study was performed. The channels recorded were: nasal airflow, thoracic respiratory movement, and oxygen saturation with a pulse oximetry. Snoring was also monitored.  MEDICATIONS Patient self administered medications include: none reported.  SLEEP ARCHITECTURE Patient was studied for 377.5 minutes. The sleep efficiency was 98.5 % and the patient was supine for 9.3%. The arousal index was 0.0 per hour.  RESPIRATORY PARAMETERS The overall AHI was 8.3 per hour, with a central apnea index of 0.0 per hour. The oxygen nadir was 78% during sleep.  CARDIAC DATA Mean heart rate during sleep was 72.2 bpm.  IMPRESSIONS - Mild obstructive sleep apnea occurred during this study (AHI = 8.3/h). - No significant central sleep apnea occurred during this study (CAI = 0.0/h). - Oxygen desaturation was noted during this study (Min O2 = 78%, Mean 93%). Time with sat - Patient snored.  DIAGNOSIS - Obstructive Sleep Apnea (327.23 [G47.33 ICD-10]) - Nocturnal Hypoxemia (327.26 [G47.36 ICD-10])  RECOMMENDATIONS - Treatment for mild OSA is directed at symptoms with consideration of co-morbidities. Conservative management might include observation, weight loss, sleep position off back. Other options including CPAP or a fitted oral appliance would be based on clinical judgment. - Be careful with alcohol, sedatives and other CNS depressants that may worsen sleep apnea and disrupt normal sleep architecture. - Sleep hygiene should be reviewed  to assess factors that may improve sleep quality. - Weight management and regular exercise should be initiated or continued.  [Electronically signed] 12/26/2017 10:54 AM  Baird Lyons MD, ABSM Diplomate, American Board of Sleep Medicine   NPI: [id number]                          [place] [place] [facility]  ELECTRONICALLY SIGNED ON:  12/26/2017, 10:50 AM [place] PH: [phone]   FX: [phone] [place]

## 2017-12-28 ENCOUNTER — Other Ambulatory Visit: Payer: Self-pay | Admitting: Physician Assistant

## 2017-12-28 DIAGNOSIS — G4730 Sleep apnea, unspecified: Secondary | ICD-10-CM

## 2017-12-28 NOTE — Progress Notes (Signed)
Sleep study does confirm mild sleep apnea with quite a bite of oxygen desaturation. Would recommend he work on diet and exercise to promote weight loss. Would also recommend we set him up with Pulmonology to set up a CPAP device for him.

## 2017-12-28 NOTE — Progress Notes (Signed)
Advised patient of sleep study results. He is agreeable with referral to Pulmonology for treatment for mild sleep apnea. Referral placed.

## 2017-12-30 ENCOUNTER — Other Ambulatory Visit: Payer: Self-pay | Admitting: Physician Assistant

## 2017-12-30 DIAGNOSIS — I10 Essential (primary) hypertension: Secondary | ICD-10-CM

## 2018-01-18 ENCOUNTER — Ambulatory Visit: Payer: Medicare Other

## 2018-01-25 ENCOUNTER — Ambulatory Visit (INDEPENDENT_AMBULATORY_CARE_PROVIDER_SITE_OTHER): Payer: Medicare Other | Admitting: General Practice

## 2018-01-25 DIAGNOSIS — Z23 Encounter for immunization: Secondary | ICD-10-CM

## 2018-01-25 NOTE — Progress Notes (Signed)
Anthony Collins is a 72 y.o. male presents to the office today for fluzone injections, per physician's orders. Original order:01/25/18 FLuzone (med), 0.46mL (dose), IM(route) was administered LD (location) today. Patient tolerated injection.  Tristen Pennino L Mitra Duling

## 2018-02-02 ENCOUNTER — Institutional Professional Consult (permissible substitution): Payer: Medicare Other | Admitting: Pulmonary Disease

## 2018-03-01 ENCOUNTER — Ambulatory Visit (INDEPENDENT_AMBULATORY_CARE_PROVIDER_SITE_OTHER): Payer: Medicare Other | Admitting: Pulmonary Disease

## 2018-03-01 ENCOUNTER — Encounter: Payer: Self-pay | Admitting: Pulmonary Disease

## 2018-03-01 VITALS — BP 146/76 | HR 98 | Ht 67.5 in | Wt 164.8 lb

## 2018-03-01 DIAGNOSIS — G4733 Obstructive sleep apnea (adult) (pediatric): Secondary | ICD-10-CM

## 2018-03-01 DIAGNOSIS — Z87898 Personal history of other specified conditions: Secondary | ICD-10-CM | POA: Diagnosis not present

## 2018-03-01 NOTE — Patient Instructions (Signed)
Mild obstructive sleep apnea on recent study  No significant daytime symptoms  Will not start any treatment for sleep apnea at present If there is worsening symptoms daytime sleepiness, more concern about snoring/apnea being witnessed Treatment may be indicated  Continue with elevation of the head of the bed, avoid supine sleep as possible  I will see you back in the office in about 6 months

## 2018-03-01 NOTE — Progress Notes (Signed)
DENT PLANTZ    [id number]    Aug 12, 1945  Primary Care Physician:Martin, Luanna Cole, PA-C  Referring Physician: Brunetta Jeans, PA-C [address] [place], [place] [address]    Chief complaint:   History of snoring, Witnessed apneas  HPI:  Patient with a history of snoring, witnessed apneas by spouse He had a sleep study showing mild obstructive sleep apnea  He feels well whenever he wakes up in the morning Usually goes to bed about 8:30 PM Final awakening time about 6 AM Wakes up about twice in the early sleep period to use the bathroom He wakes up in the later part for unknown reasons He is not sleepy during the day He states he may take a nap after lunch not always  He has a history of hypertension-well-controlled No history of heart disease No history of irregular heartbeat No previous history of CVA No mood disorder  Past history of kidney stones is why he drinks a lot of fluids  Never smoker  Outpatient Encounter Medications as of 03/01/2018  Medication Sig  . Cholecalciferol (VITAMIN D3) 1000 UNITS CAPS Take 1 capsule by mouth daily.  . finasteride (PROSCAR) 5 MG tablet Take 1 tablet (5 mg total) by mouth daily.  Marland Kitchen ibuprofen (ADVIL,MOTRIN) 200 MG tablet Take 200 mg by mouth every 6 (six) hours as needed.  . irbesartan (AVAPRO) 300 MG tablet Take 1 tablet (300 mg total) by mouth every morning.  . metoprolol succinate (TOPROL-XL) 25 MG 24 hr tablet TAKE 1 TABLET (25 MG TOTAL) BY MOUTH EVERY EVENING.  . Multiple Vitamin (MULTIVITAMIN WITH MINERALS) TABS tablet Take 1 tablet by mouth daily.  Marland Kitchen omeprazole (PRILOSEC) 40 MG capsule TAKE 1 CAPSULE (40 MG TOTAL) BY MOUTH DAILY AS NEEDED (HEART BURN).  . tamsulosin (FLOMAX) 0.4 MG CAPS capsule Take 1 capsule (0.4 mg total) by mouth daily.  . [DISCONTINUED] vitamin B-12 (CYANOCOBALAMIN) 500 MCG tablet Take 500 mcg by mouth daily.   No facility-administered encounter medications on file as of  03/01/2018.     Allergies as of 03/01/2018  . (No Known Allergies)    Past Medical History:  Diagnosis Date  . Bladder calculus   . GERD (gastroesophageal reflux disease)   . History of kidney stones   . Hypertension   . Spinal stenosis   . Urgency of urination   . Wears glasses     Past Surgical History:  Procedure Laterality Date  . BLEPHAROPLASTY    . EXTRACORPOREAL SHOCK WAVE LITHOTRIPSY  2004  . EYE SURGERY    . INGUINAL HERNIA REPAIR Left 1998  (spinal anesthesia)  . INGUINAL HERNIA REPAIR Right 05/04/2017   Procedure: OPEN RIGHT INGUINAL HERNIA REPAIR WITH MESH;  Surgeon: Judeth Horn, MD;  Location: [facility];  Service: General;  Laterality: Right;  . INSERTION OF MESH Right 05/04/2017   Procedure: INSERTION OF MESH;  Surgeon: Judeth Horn, MD;  Location: [facility];  Service: General;  Laterality: Right;  . TONSILLECTOMY  age 50    Family History  Problem Relation Age of Onset  . Breast cancer Mother   . Hypertension Mother   . Lung cancer Father     Social History   Socioeconomic History  . Marital status: Married    Spouse name: Not on file  . Number of children: Not on file  . Years of education: 67  . Highest education level: Not on file  Occupational History  . Occupation: Retired   Scientific laboratory technician  . Financial resource strain: Not on file  . Food insecurity:    Worry: Not on file    Inability: Not on file  . Transportation needs:    Medical: Not on file    Non-medical: Not on file  Tobacco Use  . Smoking status: Never Smoker  . Smokeless tobacco: Never Used  Substance and Sexual Activity  . Alcohol use: Yes    Alcohol/week: 14.0 standard drinks    Types: 14 Glasses of wine per week    Comment: 2 wine daily  . Drug use: No  . Sexual activity: Yes  Lifestyle  . Physical activity:    Days per week: Not on file    Minutes per session: Not on file  . Stress: Not on file  Relationships  . Social connections:     Talks on phone: Not on file    Gets together: Not on file    Attends religious service: Not on file    Active member of club or organization: Not on file    Attends meetings of clubs or organizations: Not on file    Relationship status: Not on file  . Intimate partner violence:    Fear of current or ex partner: Not on file    Emotionally abused: Not on file    Physically abused: Not on file    Forced sexual activity: Not on file  Other Topics Concern  . 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  Negative for fatigue.  HENT: Negative.   Respiratory: Positive for apnea.   Cardiovascular: Negative.   Gastrointestinal: Negative.   Endocrine: Negative.   Genitourinary: Negative.     Vitals:   03/01/18 0953  BP: (!) 146/76  Pulse: 98  SpO2: 96%     Physical Exam  Constitutional: He appears well-developed and well-nourished.  HENT:  Head: Normocephalic and atraumatic.  Mallampati 2  Eyes: Pupils are equal, round, and reactive to light. Conjunctivae are normal. Right eye exhibits no discharge. Left eye exhibits no discharge.  Neck: Normal range of motion. Neck supple. No tracheal deviation present. No thyromegaly present.  Cardiovascular: Normal rate and regular rhythm.  Pulmonary/Chest: Effort normal and breath sounds normal. No respiratory distress. He has no wheezes.  Abdominal: Soft. Bowel sounds are normal.   Data Reviewed: Sleep study reveals an AHI of 8  Assessment:  Mild obstructive sleep apnea with no significant symptoms during the day -There is no significant symptoms suggesting his daily activities affected by his mild obstructive sleep apnea  Multiple awakenings at night relating to increase fluid intake -Is more concerned about his history of kidney stones and is trying to keep this at Luck by drinking enough fluids  Snoring -Behavioral  modifications may help  Plan/Recommendations:  Behavioral modifications-avoiding supine sleep, elevation of the head of the bed by about 30 degrees may help snoring and mild apnea  Continue to stay active, to keep weight off  If there is any increased symptomatology during the day of sleepiness, fatigue Treatment may be indicated at that time  If there is increasing concern about symptoms, one may ask the question whether the problem was underdiagnosed on the snapshot that we have at present  Encouraged to call if any worsening symptoms  I will see him for follow-up in about 6 months  Sherrilyn Rist MD [facility] 03/01/2018, 9:59 AM  CC: Brunetta Jeans, PA-C

## 2018-04-21 ENCOUNTER — Other Ambulatory Visit: Payer: Self-pay | Admitting: Physician Assistant

## 2018-04-21 DIAGNOSIS — K219 Gastro-esophageal reflux disease without esophagitis: Secondary | ICD-10-CM

## 2018-05-20 ENCOUNTER — Telehealth: Payer: Self-pay | Admitting: Pulmonary Disease

## 2018-05-20 NOTE — Telephone Encounter (Signed)
Called and spoke with Patient.  He stated that he was contacted by medicare and told that they will not cover his OV from 03/12/18, with Dr Ander Slade, because there is no reason listed for OV. Patient stated that they he was told that insurance needs to be re-filed with OV reason, and Medicare will cover.  Message routed to [employer] 

## 2018-05-24 ENCOUNTER — Ambulatory Visit (INDEPENDENT_AMBULATORY_CARE_PROVIDER_SITE_OTHER): Payer: Medicare Other | Admitting: Physician Assistant

## 2018-05-24 ENCOUNTER — Other Ambulatory Visit: Payer: Self-pay

## 2018-05-24 ENCOUNTER — Encounter: Payer: Self-pay | Admitting: Physician Assistant

## 2018-05-24 VITALS — BP 120/82 | HR 68 | Temp 98.0°F | Resp 14 | Ht 68.0 in | Wt 163.0 lb

## 2018-05-24 DIAGNOSIS — L739 Follicular disorder, unspecified: Secondary | ICD-10-CM

## 2018-05-24 DIAGNOSIS — I10 Essential (primary) hypertension: Secondary | ICD-10-CM

## 2018-05-24 DIAGNOSIS — N400 Enlarged prostate without lower urinary tract symptoms: Secondary | ICD-10-CM

## 2018-05-24 DIAGNOSIS — R350 Frequency of micturition: Secondary | ICD-10-CM | POA: Diagnosis not present

## 2018-05-24 DIAGNOSIS — Z125 Encounter for screening for malignant neoplasm of prostate: Secondary | ICD-10-CM

## 2018-05-24 LAB — COMPREHENSIVE METABOLIC PANEL
ALBUMIN: 4.5 g/dL (ref 3.5–5.2)
ALT: 12 U/L (ref 0–53)
AST: 16 U/L (ref 0–37)
Alkaline Phosphatase: 67 U/L (ref 39–117)
BILIRUBIN TOTAL: 2 mg/dL — AB (ref 0.2–1.2)
BUN: 16 mg/dL (ref 6–23)
CALCIUM: 9.6 mg/dL (ref 8.4–10.5)
CO2: 31 mEq/L (ref 19–32)
CREATININE: 1.05 mg/dL (ref 0.40–1.50)
Chloride: 102 mEq/L (ref 96–112)
GFR: 69.36 mL/min (ref >60.00)
Glucose, Bld: 103 mg/dL — ABNORMAL HIGH (ref 70–99)
Potassium: 5.1 mEq/L (ref 3.5–5.1)
Sodium: 140 mEq/L (ref 135–145)
Total Protein: 7.1 g/dL (ref 6.0–8.3)

## 2018-05-24 LAB — PSA, MEDICARE: PSA: 0.53 ng/ml (ref 0.10–4.00)

## 2018-05-24 LAB — LIPID PANEL
CHOL/HDL RATIO: 2
CHOLESTEROL: 131 mg/dL (ref 0–200)
HDL: 70.7 mg/dL (ref >39.00)
LDL Cholesterol: 45 mg/dL (ref 0–99)
NonHDL: 60.14
TRIGLYCERIDES: 76 mg/dL (ref 0.0–149.0)
VLDL: 15.2 mg/dL (ref 0.0–40.0)

## 2018-05-24 MED ORDER — DOXYCYCLINE HYCLATE 100 MG PO CAPS: 100.0000 mg | ORAL_CAPSULE | Freq: Two times a day (BID) | ORAL | 0 refills | Status: DC

## 2018-05-24 MED ORDER — TAMSULOSIN HCL 0.4 MG PO CAPS: 0.4000 mg | ORAL_CAPSULE | Freq: Every day | ORAL | 1 refills | Status: DC

## 2018-05-24 MED ORDER — FINASTERIDE 5 MG PO TABS: 5.0000 mg | ORAL_TABLET | Freq: Every day | ORAL | 1 refills | Status: DC

## 2018-05-24 NOTE — Progress Notes (Signed)
Patient presents to clinic today for follow-up of hypertension and BPH.  Hypertension -- Patient is currently on a regimen of Irbesartan 300 mg and Toprol XL 25 mg daily. Endorses taking medications as directed. Patient denies chest pain, palpitations, lightheadedness, dizziness, vision changes or frequent headaches. Is checking BP at home and noting it averaging 120-130/80s. Is walking 1-2 miles 3-4 x week. Lives on 3rd floor apartment so climbs stairs without issue. Body mass index is 24.78 kg/m. Endorses keepiing a well-balanced diet overall. Mostly home cook meals and mediterranean diet.   BP Readings from Last 3 Encounters:  05/24/18 120/82  03/01/18 (!) 146/76  11/26/17 130/78   BPH -- Currently on Tamsulosin .4 mg and Finasteride 5 mg daily. Endorses taking as directed. Endorses good urinary flow. Is completely emptying bladder. Rare frequency. Nocturia x 0-2 (previously 4+ night).  Denies new concerns at today's visit.   Past Medical History:  Diagnosis Date  . Bladder calculus   . GERD (gastroesophageal reflux disease)   . History of kidney stones   . Hypertension   . Spinal stenosis   . Urgency of urination   . Wears glasses     Current Outpatient Medications on File Prior to Visit  Medication Sig Dispense Refill  . Cholecalciferol (VITAMIN D3) 1000 UNITS CAPS Take 1 capsule by mouth daily.    . irbesartan (AVAPRO) 300 MG tablet Take 1 tablet (300 mg total) by mouth every morning. 90 tablet 1  . metoprolol succinate (TOPROL-XL) 25 MG 24 hr tablet TAKE 1 TABLET (25 MG TOTAL) BY MOUTH EVERY EVENING. 90 tablet 1  . Multiple Vitamin (MULTIVITAMIN WITH MINERALS) TABS tablet Take 1 tablet by mouth daily.    Marland Kitchen omeprazole (PRILOSEC) 40 MG capsule TAKE 1 CAPSULE (40 MG TOTAL) BY MOUTH DAILY AS NEEDED (HEART BURN). 90 capsule 1   No current facility-administered medications on file prior to visit.     No Known Allergies  Family History  Problem Relation Age of Onset  .  Breast cancer Mother   . Hypertension Mother   . Lung cancer Father     Social History   Socioeconomic History  . Marital status: Married    Spouse name: Not on file  . Number of children: Not on file  . Years of education: 37  . Highest education level: Not on file  Occupational History  . Occupation: Retired   Scientific laboratory technician  . Financial resource strain: Not on file  . Food insecurity:    Worry: Not on file    Inability: Not on file  . Transportation needs:    Medical: Not on file    Non-medical: Not on file  Tobacco Use  . Smoking status: Never Smoker  . Smokeless tobacco: Never Used  Substance and Sexual Activity  . Alcohol use: Yes    Alcohol/week: 14.0 standard drinks    Types: 14 Glasses of wine per week    Comment: 2 wine daily  . Drug use: No  . Sexual activity: Yes  Lifestyle  . Physical activity:    Days per week: Not on file    Minutes per session: Not on file  . Stress: Not on file  Relationships  . Social connections:    Talks on phone: Not on file    Gets together: Not on file    Attends religious service: Not on file    Active member of club or organization: Not on file    Attends meetings of clubs or  organizations: Not on file    Relationship status: Not on file  Other Topics Concern  . 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  All other ROS are negative.  BP 120/82   Pulse 68   Temp 98 F (36.7 C) (Oral)   Resp 14   Ht 5\' 8"  (1.727 m)   Wt 163 lb (73.9 kg)   SpO2 98%   BMI 24.78 kg/m   Physical Exam Vitals signs reviewed.  Constitutional:      Appearance: Normal appearance.  HENT:     Head: Normocephalic and atraumatic.     Right Ear: Tympanic membrane normal.     Left Ear: Tympanic membrane normal.     Nose: Nose normal.     Mouth/Throat:     Mouth: Mucous membranes are moist.  Neck:     Musculoskeletal: Neck supple.    Cardiovascular:     Rate and Rhythm: Normal rate and regular rhythm.     Heart sounds: Normal heart sounds.  Pulmonary:     Effort: Pulmonary effort is normal.     Breath sounds: Normal breath sounds.  Neurological:     Mental Status: He is alert.    Assessment/Plan: Essential hypertension BP stable. Repeat labs today. Continue current regimen.  Folliculitis No current episode. Will refill the Doxycycline to keep on hand in case of outbreak. Continue use of Hibiclens.  Benign prostatic hyperplasia without lower urinary tract symptoms Doing very well on current regimen without breakthrough symptoms. Continue same. PSA today.  Prostate cancer screening The natural history of prostate cancer and ongoing controversy regarding screening and potential treatment outcomes of prostate cancer has been discussed with the patient. The meaning of a false positive PSA and a false negative PSA has been discussed. He indicates understanding of the limitations of this screening test and wishes  to proceed with screening PSA testing.     Leeanne Rio, PA-C

## 2018-05-24 NOTE — Assessment & Plan Note (Signed)
Doing very well on current regimen without breakthrough symptoms. Continue same. PSA today.

## 2018-05-24 NOTE — Assessment & Plan Note (Signed)
BP stable. Repeat labs today. Continue current regimen.

## 2018-05-24 NOTE — Assessment & Plan Note (Signed)
No current episode. Will refill the Doxycycline to keep on hand in case of outbreak. Continue use of Hibiclens.

## 2018-05-24 NOTE — Assessment & Plan Note (Signed)
The natural history of prostate cancer and ongoing controversy regarding screening and potential treatment outcomes of prostate cancer has been discussed with the patient. The meaning of a false positive PSA and a false negative PSA has been discussed. He indicates understanding of the limitations of this screening test and wishes  to proceed with screening PSA testing.  

## 2018-05-24 NOTE — Patient Instructions (Addendum)
Please go to the lab today for blood work.  I will call you with your results. We will alter treatment regimen(s) if indicated by your results.   Please continue current medication regimen.  Follow-up in 6 months with me.

## 2018-05-25 NOTE — Telephone Encounter (Signed)
Anthony Collins please advise thank you.

## 2018-05-26 NOTE — Telephone Encounter (Signed)
Will await on update from [place].

## 2018-05-27 NOTE — Telephone Encounter (Signed)
Kathlee Nations please advise thank you.

## 2018-06-01 NOTE — Telephone Encounter (Signed)
Office visit was filed with a primary diagnosis coded that Medicare does not like.  Sent in charge correction asking to have the primary diagnosis changed to G47.33 and a corrected claim submitted.  Called the patient and let him know this had been done.  Nothing further needed.

## 2018-06-01 NOTE — Telephone Encounter (Signed)
Kathlee Nations please advise if you have an update for the patient, thank you.

## 2018-06-02 NOTE — Telephone Encounter (Signed)
Noted.  Will close message. 

## 2018-06-08 ENCOUNTER — Other Ambulatory Visit: Payer: Self-pay

## 2018-06-08 ENCOUNTER — Other Ambulatory Visit (INDEPENDENT_AMBULATORY_CARE_PROVIDER_SITE_OTHER): Payer: Medicare Other

## 2018-06-08 DIAGNOSIS — R17 Unspecified jaundice: Secondary | ICD-10-CM | POA: Diagnosis not present

## 2018-06-09 LAB — BILIRUBIN, FRACTIONATED(TOT/DIR/INDIR)
BILIRUBIN INDIRECT: 0.9 mg/dL (ref 0.2–1.2)
Bilirubin, Direct: 0.2 mg/dL (ref 0.0–0.2)
Total Bilirubin: 1.1 mg/dL (ref 0.2–1.2)

## 2018-08-31 ENCOUNTER — Other Ambulatory Visit: Payer: Self-pay | Admitting: Physician Assistant

## 2018-08-31 DIAGNOSIS — I10 Essential (primary) hypertension: Secondary | ICD-10-CM

## 2018-10-16 ENCOUNTER — Other Ambulatory Visit: Payer: Self-pay | Admitting: Physician Assistant

## 2018-10-16 DIAGNOSIS — K219 Gastro-esophageal reflux disease without esophagitis: Secondary | ICD-10-CM

## 2018-11-17 DIAGNOSIS — Z961 Presence of intraocular lens: Secondary | ICD-10-CM | POA: Diagnosis not present

## 2018-11-22 ENCOUNTER — Ambulatory Visit (INDEPENDENT_AMBULATORY_CARE_PROVIDER_SITE_OTHER): Payer: Medicare Other | Admitting: Physician Assistant

## 2018-11-22 ENCOUNTER — Other Ambulatory Visit: Payer: Self-pay

## 2018-11-22 ENCOUNTER — Encounter: Payer: Self-pay | Admitting: Physician Assistant

## 2018-11-22 VITALS — BP 122/74 | HR 58 | Temp 97.9°F | Resp 16 | Ht 68.0 in | Wt 160.0 lb

## 2018-11-22 DIAGNOSIS — N400 Enlarged prostate without lower urinary tract symptoms: Secondary | ICD-10-CM

## 2018-11-22 DIAGNOSIS — I10 Essential (primary) hypertension: Secondary | ICD-10-CM | POA: Diagnosis not present

## 2018-11-22 DIAGNOSIS — L739 Follicular disorder, unspecified: Secondary | ICD-10-CM | POA: Diagnosis not present

## 2018-11-22 LAB — BASIC METABOLIC PANEL
BUN: 16 mg/dL (ref 6–23)
CO2: 29 mEq/L (ref 19–32)
Calcium: 9.5 mg/dL (ref 8.4–10.5)
Chloride: 103 mEq/L (ref 96–112)
Creatinine, Ser: 0.97 mg/dL (ref 0.40–1.50)
GFR: 75.9 mL/min (ref >60.00)
Glucose, Bld: 96 mg/dL (ref 70–99)
Potassium: 4.5 mEq/L (ref 3.5–5.1)
Sodium: 140 mEq/L (ref 135–145)

## 2018-11-22 MED ORDER — DOXYCYCLINE HYCLATE 100 MG PO CAPS: 100.0000 mg | ORAL_CAPSULE | Freq: Two times a day (BID) | ORAL | 0 refills | Status: DC

## 2018-11-22 NOTE — Progress Notes (Signed)
Patient presents to clinic today for follow-up of essential hypertension. Patient is currently on a regimen of irbesartan 300 mg QD, metoprolol succinate 25 mg QD. Endorses taking medications as directed and tolerating well. He is keeping up with low-sodium diet and trying to exercise (Walks 10-14 miles per week). Is checking BP at home and noting it to average in the 120s/70s. Patient denies chest pain, palpitations, lightheadedness, dizziness, vision changes or frequent headaches.  BP Readings from Last 3 Encounters:  05/24/18 120/82  03/01/18 (!) 146/76  11/26/17 130/78   Patient also following up for his BPH, currently on combination of finasteride and tamsulosin. Endorses taking medications as directed. Still tolerating well with food relief of symptoms. No residual issues on current regimen.   Lab Results  Component Value Date   PSA 0.53 05/24/2018   PSA 0.74 07/03/2017   Patient also requesting refill of Doxycycline. Patient with history of intermittent bouts of scalp folliculitis, not responsive to OTC treatments. Patient endorses current flare of posterior scalp with pain and tenderness. Denies drainage. Denies fever, chills.   Declines flu shot today. Defers to a later date AMA.   Past Medical History:  Diagnosis Date  . Bladder calculus   . GERD (gastroesophageal reflux disease)   . History of kidney stones   . Hypertension   . Spinal stenosis   . Urgency of urination   . Wears glasses     Current Outpatient Medications on File Prior to Visit  Medication Sig Dispense Refill  . Cholecalciferol (VITAMIN D3) 1000 UNITS CAPS Take 1 capsule by mouth daily.    . finasteride (PROSCAR) 5 MG tablet Take 1 tablet (5 mg total) by mouth daily. 90 tablet 1  . irbesartan (AVAPRO) 300 MG tablet Take 1 tablet (300 mg total) by mouth every morning. 90 tablet 1  . metoprolol succinate (TOPROL-XL) 25 MG 24 hr tablet TAKE 1 TABLET (25 MG TOTAL) BY MOUTH EVERY EVENING. 90 tablet 1  .  Multiple Vitamin (MULTIVITAMIN WITH MINERALS) TABS tablet Take 1 tablet by mouth daily.    Marland Kitchen omeprazole (PRILOSEC) 40 MG capsule TAKE 1 CAPSULE (40 MG TOTAL) BY MOUTH DAILY AS NEEDED (HEART BURN). 90 capsule 1  . tamsulosin (FLOMAX) 0.4 MG CAPS capsule Take 1 capsule (0.4 mg total) by mouth daily. 90 capsule 1   No current facility-administered medications on file prior to visit.     No Known Allergies  Family History  Problem Relation Age of Onset  . Breast cancer Mother   . Hypertension Mother   . Lung cancer Father     Social History   Socioeconomic History  . Marital status: Married    Spouse name: Not on file  . Number of children: Not on file  . Years of education: 60  . Highest education level: Not on file  Occupational History  . Occupation: Retired   Scientific laboratory technician  . Financial resource strain: Not on file  . Food insecurity    Worry: Not on file    Inability: Not on file  . Transportation needs    Medical: Not on file    Non-medical: Not on file  Tobacco Use  . Smoking status: Never Smoker  . Smokeless tobacco: Never Used  Substance and Sexual Activity  . Alcohol use: Yes    Alcohol/week: 14.0 standard drinks    Types: 14 Glasses of wine per week    Comment: 2 wine daily  . Drug use: No  . Sexual activity: Yes  Lifestyle  . Physical activity    Days per week: Not on file    Minutes per session: Not on file  . Stress: Not on file  Relationships  . Social Herbalist on phone: Not on file    Gets together: Not on file    Attends religious service: Not on file    Active member of club or organization: Not on file    Attends meetings of clubs or organizations: Not on file    Relationship status: Not on file  Other Topics Concern  . 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  All other ROS are negative.  Ht 5\' 8"  (1.727 m)   Wt 160 lb (72.6 kg)    BMI 24.33 kg/m   Physical Exam Vitals signs reviewed.  Constitutional:      Appearance: Normal appearance.  HENT:     Head: Normocephalic and atraumatic.  Eyes:     Conjunctiva/sclera: Conjunctivae normal.     Pupils: Pupils are equal, round, and reactive to light.  Neck:     Musculoskeletal: Neck supple.  Cardiovascular:     Rate and Rhythm: Normal rate and regular rhythm.     Pulses: Normal pulses.     Heart sounds: Normal heart sounds.  Pulmonary:     Effort: Pulmonary effort is normal.  Skin:      Neurological:     General: No focal deficit present.     Mental Status: He is alert and oriented to person, place, and time.  Psychiatric:        Mood and Affect: Mood normal.    Assessment/Plan: 1. Essential hypertension BP stable. Asymptomatic. Will continue current regimen. Repeat BMP today. - Basic metabolic panel  2. Benign prostatic hyperplasia without lower urinary tract symptoms Stable. Continue current regimen. PSA up-to-date. Recheck at wellness in 6 months.   3. Folliculitis Mild flares. One currently. Refilled Doxycycline to use as directed. Return precautions reviewed.  - doxycycline (VIBRAMYCIN) 100 MG capsule; Take 1 capsule (100 mg total) by mouth 2 (two) times daily.  Dispense: 14 capsule; Refill: 0   Leeanne Rio, PA-C

## 2018-11-22 NOTE — Patient Instructions (Signed)
Please go to the lab today for blood work.  I will call you with your results. We will alter treatment regimen(s) if indicated by your results.   I have sent a refill of your antibiotic for the folliculitis to have on hand. Continue supportive measures.  Make sure to schedule an appointment for flu shot in October.   Follow-up with me in 6 months for Medicare Wellness visit.    DASH Eating Plan DASH stands for "Dietary Approaches to Stop Hypertension." The DASH eating plan is a healthy eating plan that has been shown to reduce high blood pressure (hypertension). It may also reduce your risk for type 2 diabetes, heart disease, and stroke. The DASH eating plan may also help with weight loss. What are tips for following this plan?  General guidelines  Avoid eating more than 2,300 mg (milligrams) of salt (sodium) a day. If you have hypertension, you may need to reduce your sodium intake to 1,500 mg a day.  Limit alcohol intake to no more than 1 drink a day for nonpregnant women and 2 drinks a day for men. One drink equals 12 oz of beer, 5 oz of wine, or 1 oz of hard liquor.  Work with your health care provider to maintain a healthy body weight or to lose weight. Ask what an ideal weight is for you.  Get at least 30 minutes of exercise that causes your heart to beat faster (aerobic exercise) most days of the week. Activities may include walking, swimming, or biking.  Work with your health care provider or diet and nutrition specialist (dietitian) to adjust your eating plan to your individual calorie needs. Reading food labels   Check food labels for the amount of sodium per serving. Choose foods with less than 5 percent of the Daily Value of sodium. Generally, foods with less than 300 mg of sodium per serving fit into this eating plan.  To find whole grains, look for the word "whole" as the first word in the ingredient list. Shopping  Buy products labeled as "low-sodium" or "no salt  added."  Buy fresh foods. Avoid canned foods and premade or frozen meals. Cooking  Avoid adding salt when cooking. Use salt-free seasonings or herbs instead of table salt or sea salt. Check with your health care provider or pharmacist before using salt substitutes.  Do not fry foods. Cook foods using healthy methods such as baking, boiling, grilling, and broiling instead.  Cook with heart-healthy oils, such as olive, canola, soybean, or sunflower oil. Meal planning  Eat a balanced diet that includes: ? 5 or more servings of fruits and vegetables each day. At each meal, try to fill half of your plate with fruits and vegetables. ? Up to 6-8 servings of whole grains each day. ? Less than 6 oz of lean meat, poultry, or fish each day. A 3-oz serving of meat is about the same size as a deck of cards. One egg equals 1 oz. ? 2 servings of low-fat dairy each day. ? A serving of nuts, seeds, or beans 5 times each week. ? Heart-healthy fats. Healthy fats called Omega-3 fatty acids are found in foods such as flaxseeds and coldwater fish, like sardines, salmon, and mackerel.  Limit how much you eat of the following: ? Canned or prepackaged foods. ? Food that is high in trans fat, such as fried foods. ? Food that is high in saturated fat, such as fatty meat. ? Sweets, desserts, sugary drinks, and other foods with  added sugar. ? Full-fat dairy products.  Do not salt foods before eating.  Try to eat at least 2 vegetarian meals each week.  Eat more home-cooked food and less restaurant, buffet, and fast food.  When eating at a restaurant, ask that your food be prepared with less salt or no salt, if possible. What foods are recommended? The items listed may not be a complete list. Talk with your dietitian about what dietary choices are best for you. Grains Whole-grain or whole-wheat bread. Whole-grain or whole-wheat pasta. Brown rice. [place]. Bulgur. Whole-grain and low-sodium cereals.  Pita bread. Low-fat, low-sodium crackers. Whole-wheat flour tortillas. Vegetables Fresh or frozen vegetables (raw, steamed, roasted, or grilled). Low-sodium or reduced-sodium tomato and vegetable juice. Low-sodium or reduced-sodium tomato sauce and tomato paste. Low-sodium or reduced-sodium canned vegetables. Fruits All fresh, dried, or frozen fruit. Canned fruit in natural juice (without added sugar). Meat and other protein foods Skinless chicken or [place]. Ground chicken or [place]. Pork with fat trimmed off. Fish and seafood. Egg whites. Dried beans, peas, or lentils. Unsalted nuts, nut butters, and seeds. Unsalted canned beans. Lean cuts of beef with fat trimmed off. Low-sodium, lean deli meat. Dairy Low-fat (1%) or fat-free (skim) milk. Fat-free, low-fat, or reduced-fat cheeses. Nonfat, low-sodium ricotta or cottage cheese. Low-fat or nonfat yogurt. Low-fat, low-sodium cheese. Fats and oils Soft margarine without trans fats. Vegetable oil. Low-fat, reduced-fat, or light mayonnaise and salad dressings (reduced-sodium). Canola, safflower, olive, soybean, and sunflower oils. Avocado. Seasoning and other foods Herbs. Spices. Seasoning mixes without salt. Unsalted popcorn and pretzels. Fat-free sweets. What foods are not recommended? The items listed may not be a complete list. Talk with your dietitian about what dietary choices are best for you. Grains Baked goods made with fat, such as croissants, muffins, or some breads. Dry pasta or rice meal packs. Vegetables Creamed or fried vegetables. Vegetables in a cheese sauce. Regular canned vegetables (not low-sodium or reduced-sodium). Regular canned tomato sauce and paste (not low-sodium or reduced-sodium). Regular tomato and vegetable juice (not low-sodium or reduced-sodium). Angie Fava. Olives. Fruits Canned fruit in a light or heavy syrup. Fried fruit. Fruit in cream or butter sauce. Meat and other protein foods Fatty cuts of meat. Ribs. Fried  meat. Berniece Salines. Sausage. Bologna and other processed lunch meats. Salami. Fatback. Hotdogs. Bratwurst. Salted nuts and seeds. Canned beans with added salt. Canned or smoked fish. Whole eggs or egg yolks. Chicken or [place] with skin. Dairy Whole or 2% milk, cream, and half-and-half. Whole or full-fat cream cheese. Whole-fat or sweetened yogurt. Full-fat cheese. Nondairy creamers. Whipped toppings. Processed cheese and cheese spreads. Fats and oils Butter. Stick margarine. Lard. Shortening. Ghee. Bacon fat. Tropical oils, such as coconut, palm kernel, or palm oil. Seasoning and other foods Salted popcorn and pretzels. Onion salt, garlic salt, seasoned salt, table salt, and sea salt. Worcestershire sauce. Tartar sauce. Barbecue sauce. Teriyaki sauce. Soy sauce, including reduced-sodium. Steak sauce. Canned and packaged gravies. Fish sauce. Oyster sauce. Cocktail sauce. Horseradish that you find on the shelf. Ketchup. Mustard. Meat flavorings and tenderizers. Bouillon cubes. Hot sauce and Tabasco sauce. Premade or packaged marinades. Premade or packaged taco seasonings. Relishes. Regular salad dressings. Where to find more information:  National Heart, Lung, and [place]: https://wilson-eaton.com/  American Heart Association: www.heart.org Summary  The DASH eating plan is a healthy eating plan that has been shown to reduce high blood pressure (hypertension). It may also reduce your risk for type 2 diabetes, heart disease, and stroke.  With the DASH  eating plan, you should limit salt (sodium) intake to 2,300 mg a day. If you have hypertension, you may need to reduce your sodium intake to 1,500 mg a day.  When on the DASH eating plan, aim to eat more fresh fruits and vegetables, whole grains, lean proteins, low-fat dairy, and heart-healthy fats.  Work with your health care provider or diet and nutrition specialist (dietitian) to adjust your eating plan to your individual calorie needs. This information is  not intended to replace advice given to you by your health care provider. Make sure you discuss any questions you have with your health care provider. Document Released: 02/27/2011 Document Revised: 02/20/2017 Document Reviewed: 03/03/2016 Elsevier Patient Education  2020 [employer].

## 2018-11-30 DIAGNOSIS — D2272 Melanocytic nevi of left lower limb, including hip: Secondary | ICD-10-CM | POA: Diagnosis not present

## 2018-11-30 DIAGNOSIS — D1801 Hemangioma of skin and subcutaneous tissue: Secondary | ICD-10-CM | POA: Diagnosis not present

## 2018-11-30 DIAGNOSIS — L821 Other seborrheic keratosis: Secondary | ICD-10-CM | POA: Diagnosis not present

## 2018-11-30 DIAGNOSIS — D225 Melanocytic nevi of trunk: Secondary | ICD-10-CM | POA: Diagnosis not present

## 2018-11-30 DIAGNOSIS — D2271 Melanocytic nevi of right lower limb, including hip: Secondary | ICD-10-CM | POA: Diagnosis not present

## 2019-01-03 ENCOUNTER — Other Ambulatory Visit: Payer: Self-pay | Admitting: Physician Assistant

## 2019-01-03 DIAGNOSIS — I10 Essential (primary) hypertension: Secondary | ICD-10-CM

## 2019-01-03 MED ORDER — IRBESARTAN 300 MG PO TABS: 300.0000 mg | ORAL_TABLET | Freq: Every morning | ORAL | 1 refills | Status: DC

## 2019-01-14 ENCOUNTER — Ambulatory Visit: Payer: Medicare Other

## 2019-01-18 ENCOUNTER — Ambulatory Visit: Payer: Medicare Other

## 2019-01-25 ENCOUNTER — Ambulatory Visit (INDEPENDENT_AMBULATORY_CARE_PROVIDER_SITE_OTHER): Payer: Medicare Other

## 2019-01-25 ENCOUNTER — Other Ambulatory Visit: Payer: Self-pay

## 2019-01-25 DIAGNOSIS — Z23 Encounter for immunization: Secondary | ICD-10-CM | POA: Diagnosis not present

## 2019-03-02 ENCOUNTER — Other Ambulatory Visit: Payer: Self-pay | Admitting: Physician Assistant

## 2019-03-02 DIAGNOSIS — R350 Frequency of micturition: Secondary | ICD-10-CM

## 2019-03-12 ENCOUNTER — Other Ambulatory Visit: Payer: Self-pay | Admitting: Physician Assistant

## 2019-03-12 DIAGNOSIS — I10 Essential (primary) hypertension: Secondary | ICD-10-CM

## 2019-04-09 ENCOUNTER — Ambulatory Visit: Payer: Medicare Other | Attending: Internal Medicine

## 2019-04-09 DIAGNOSIS — Z23 Encounter for immunization: Secondary | ICD-10-CM | POA: Insufficient documentation

## 2019-04-09 NOTE — Progress Notes (Signed)
   Covid-19 Vaccination Clinic  Name:  Anthony Collins    MRN: [id number] DOB: 04-07-1945  04/09/2019  Mr. Anthony Collins was observed post Covid-19 immunization for 15 minutes without incidence. He was provided with Vaccine Information Sheet and instruction to access the V-Safe system.   Mr. Anthony Collins was instructed to call 911 with any severe reactions post vaccine: Marland Kitchen Difficulty breathing  . Swelling of your face and throat  . A fast heartbeat  . A bad rash all over your body  . Dizziness and weakness    Immunizations Administered    Name Date Dose VIS Date Route   Pfizer COVID-19 Vaccine 04/09/2019 12:51 PM 0.3 mL 03/04/2019 Intramuscular   Manufacturer: [place]   Lot: [id number]   [place]: [id number]

## 2019-04-18 ENCOUNTER — Other Ambulatory Visit: Payer: Self-pay | Admitting: Physician Assistant

## 2019-04-18 DIAGNOSIS — K219 Gastro-esophageal reflux disease without esophagitis: Secondary | ICD-10-CM

## 2019-04-28 ENCOUNTER — Ambulatory Visit: Payer: Medicare Other

## 2019-04-30 ENCOUNTER — Ambulatory Visit: Payer: PPO | Attending: Internal Medicine

## 2019-04-30 DIAGNOSIS — Z23 Encounter for immunization: Secondary | ICD-10-CM | POA: Insufficient documentation

## 2019-04-30 NOTE — Progress Notes (Signed)
   Covid-19 Vaccination Clinic  Name:  Anthony Collins    MRN: [id number] DOB: 10/01/1945  04/30/2019  [name] was observed post Covid-19 immunization for 15 minutes without incidence. He was provided with Vaccine Information Sheet and instruction to access the V-Safe system.   [name] was instructed to call 911 with any severe reactions post vaccine: Marland Kitchen Difficulty breathing  . Swelling of your face and throat  . A fast heartbeat  . A bad rash all over your body  . Dizziness and weakness    Immunizations Administered    Name Date Dose VIS Date Route   Pfizer COVID-19 Vaccine 04/30/2019  1:28 PM 0.3 mL 03/04/2019 Intramuscular   Manufacturer: [place]   Lot: [id number]   [place]: [id number]

## 2019-05-23 ENCOUNTER — Ambulatory Visit (INDEPENDENT_AMBULATORY_CARE_PROVIDER_SITE_OTHER): Payer: PPO | Admitting: Physician Assistant

## 2019-05-23 ENCOUNTER — Telehealth: Payer: Self-pay | Admitting: Gastroenterology

## 2019-05-23 ENCOUNTER — Other Ambulatory Visit: Payer: Self-pay

## 2019-05-23 ENCOUNTER — Encounter: Payer: Self-pay | Admitting: Physician Assistant

## 2019-05-23 VITALS — BP 128/78 | HR 68 | Temp 98.2°F | Resp 16 | Ht 67.0 in | Wt 163.0 lb

## 2019-05-23 DIAGNOSIS — Z0000 Encounter for general adult medical examination without abnormal findings: Secondary | ICD-10-CM | POA: Diagnosis not present

## 2019-05-23 DIAGNOSIS — I10 Essential (primary) hypertension: Secondary | ICD-10-CM

## 2019-05-23 DIAGNOSIS — Z125 Encounter for screening for malignant neoplasm of prostate: Secondary | ICD-10-CM | POA: Diagnosis not present

## 2019-05-23 DIAGNOSIS — Z1211 Encounter for screening for malignant neoplasm of colon: Secondary | ICD-10-CM | POA: Diagnosis not present

## 2019-05-23 DIAGNOSIS — K219 Gastro-esophageal reflux disease without esophagitis: Secondary | ICD-10-CM | POA: Diagnosis not present

## 2019-05-23 DIAGNOSIS — L739 Follicular disorder, unspecified: Secondary | ICD-10-CM | POA: Diagnosis not present

## 2019-05-23 LAB — COMPREHENSIVE METABOLIC PANEL
ALT: 17 U/L (ref 0–53)
AST: 22 U/L (ref 0–37)
Albumin: 4.3 g/dL (ref 3.5–5.2)
Alkaline Phosphatase: 54 U/L (ref 39–117)
BUN: 19 mg/dL (ref 6–23)
CO2: 29 mEq/L (ref 19–32)
Calcium: 9.5 mg/dL (ref 8.4–10.5)
Chloride: 103 mEq/L (ref 96–112)
Creatinine, Ser: 0.98 mg/dL (ref 0.40–1.50)
GFR: 74.9 mL/min (ref >60.00)
Glucose, Bld: 95 mg/dL (ref 70–99)
Potassium: 4.8 mEq/L (ref 3.5–5.1)
Sodium: 140 mEq/L (ref 135–145)
Total Bilirubin: 1.1 mg/dL (ref 0.2–1.2)
Total Protein: 7.3 g/dL (ref 6.0–8.3)

## 2019-05-23 LAB — PSA: PSA: 0.69 ng/mL (ref 0.10–4.00)

## 2019-05-23 LAB — LIPID PANEL
Cholesterol: 154 mg/dL (ref 0–200)
HDL: 64.7 mg/dL (ref >39.00)
LDL Cholesterol: 74 mg/dL (ref 0–99)
NonHDL: 89.69
Total CHOL/HDL Ratio: 2
Triglycerides: 80 mg/dL (ref 0.0–149.0)
VLDL: 16 mg/dL (ref 0.0–40.0)

## 2019-05-23 MED ORDER — DOXYCYCLINE HYCLATE 100 MG PO CAPS: 100.0000 mg | ORAL_CAPSULE | Freq: Two times a day (BID) | ORAL | 0 refills | Status: DC

## 2019-05-23 NOTE — Telephone Encounter (Signed)
Please advise 

## 2019-05-23 NOTE — Patient Instructions (Addendum)
Please go to the lab for blood work.   Our office will call you with your results unless you have chosen to receive results via MyChart.  If your blood work is normal we will follow-up each year for physicals and as scheduled for chronic medical problems.  If anything is abnormal we will treat accordingly and get you in for a follow-up.  You will be contacted by Dr. Blanch Media office for a screening colonoscopy.  Consider starting a daily probiotic.    Preventive Care 74 Years and Older, Male Preventive care refers to lifestyle choices and visits with your health care provider that can promote health and wellness. This includes:  A yearly physical exam. This is also called an annual well check.  Regular dental and eye exams.  Immunizations.  Screening for certain conditions.  Healthy lifestyle choices, such as diet and exercise. What can I expect for my preventive care visit? Physical exam Your health care provider will check:  Height and weight. These may be used to calculate body mass index (BMI), which is a measurement that tells if you are at a healthy weight.  Heart rate and blood pressure.  Your skin for abnormal spots. Counseling Your health care provider may ask you questions about:  Alcohol, tobacco, and drug use.  Emotional well-being.  Home and relationship well-being.  Sexual activity.  Eating habits.  History of falls.  Memory and ability to understand (cognition).  Work and work Statistician. What immunizations do I need?  Influenza (flu) vaccine  This is recommended every year. Tetanus, diphtheria, and pertussis (Tdap) vaccine  You may need a Td booster every 10 years. Varicella (chickenpox) vaccine  You may need this vaccine if you have not already been vaccinated. Zoster (shingles) vaccine  You may need this after age 48. Pneumococcal conjugate (PCV13) vaccine  One dose is recommended after age 41. Pneumococcal polysaccharide (PPSV23)  vaccine  One dose is recommended after age 60. Measles, mumps, and rubella (MMR) vaccine  You may need at least one dose of MMR if you were born in 1957 or later. You may also need a second dose. Meningococcal conjugate (MenACWY) vaccine  You may need this if you have certain conditions. Hepatitis A vaccine  You may need this if you have certain conditions or if you travel or work in places where you may be exposed to hepatitis A. Hepatitis B vaccine  You may need this if you have certain conditions or if you travel or work in places where you may be exposed to hepatitis B. Haemophilus influenzae type b (Hib) vaccine  You may need this if you have certain conditions. You may receive vaccines as individual doses or as more than one vaccine together in one shot (combination vaccines). Talk with your health care provider about the risks and benefits of combination vaccines. What tests do I need? Blood tests  Lipid and cholesterol levels. These may be checked every 5 years, or more frequently depending on your overall health.  Hepatitis C test.  Hepatitis B test. Screening  Lung cancer screening. You may have this screening every year starting at age 74 if you have a 30-pack-year history of smoking and currently smoke or have quit within the past 15 years.  Colorectal cancer screening. All adults should have this screening starting at age 74 and continuing until age 74. Your health care provider may recommend screening at age 74 if you are at increased risk. You will have tests every 1-10 years, depending on  your results and the type of screening test.  Prostate cancer screening. Recommendations will vary depending on your family history and other risks.  Diabetes screening. This is done by checking your blood sugar (glucose) after you have not eaten for a while (fasting). You may have this done every 1-3 years.  Abdominal aortic aneurysm (AAA) screening. You may need this if you are  a current or former smoker.  Sexually transmitted disease (STD) testing. Follow these instructions at home: Eating and drinking  Eat a diet that includes fresh fruits and vegetables, whole grains, lean protein, and low-fat dairy products. Limit your intake of foods with high amounts of sugar, saturated fats, and salt.  Take vitamin and mineral supplements as recommended by your health care provider.  Do not drink alcohol if your health care provider tells you not to drink.  If you drink alcohol: ? Limit how much you have to 0-2 drinks a day. ? Be aware of how much alcohol is in your drink. In the [place] one drink equals one 12 oz bottle of beer (355 mL), one 5 oz glass of wine (148 mL), or one 1 oz glass of hard liquor (44 mL). Lifestyle  Take daily care of your teeth and gums.  Stay active. Exercise for at least 30 minutes on 5 or more days each week.  Do not use any products that contain nicotine or tobacco, such as cigarettes, e-cigarettes, and chewing tobacco. If you need help quitting, ask your health care provider.  If you are sexually active, practice safe sex. Use a condom or other form of protection to prevent STIs (sexually transmitted infections).  Talk with your health care provider about taking a low-dose aspirin or statin. What's next?  Visit your health care provider once a year for a well check visit.  Ask your health care provider how often you should have your eyes and teeth checked.  Stay up to date on all vaccines. This information is not intended to replace advice given to you by your health care provider. Make sure you discuss any questions you have with your health care provider. Document Revised: 03/04/2018 Document Reviewed: 03/04/2018 Elsevier Patient Education  2020 [employer].

## 2019-05-23 NOTE — Progress Notes (Signed)
Subjective:   TAYSEN SHEESLEY is a 74 y.o. male who presents for Medicare Annual/Subsequent preventive examination.  Diet -- Endorses well-balanced diet overall.  Exercise -- Stretches daily. Goes for daily walk when weather is good. He and wife do this together.  Sleep -- Doing great overall. No changes. Occasional afternoon nap after his exercise.   Review of Systems:  Review of Systems  Constitutional: Negative for fever and weight loss.  HENT: Negative for ear discharge, ear pain, hearing loss and tinnitus.   Eyes: Negative for blurred vision, double vision, photophobia and pain.  Respiratory: Negative for cough and shortness of breath.   Cardiovascular: Negative for chest pain and palpitations.  Gastrointestinal: Negative for abdominal pain, blood in stool, constipation, diarrhea, heartburn, melena, nausea and vomiting.  Genitourinary: Negative for dysuria, flank pain, frequency, hematuria and urgency.  Musculoskeletal: Negative for falls.  Neurological: Negative for dizziness, loss of consciousness and headaches.  Endo/Heme/Allergies: Negative for environmental allergies.  Psychiatric/Behavioral: Negative for depression, hallucinations, substance abuse and suicidal ideas. The patient is not nervous/anxious and does not have insomnia.    Objective:    Vitals: BP 128/78   Pulse 68   Temp 98.2 F (36.8 C) (Temporal)   Resp 16   Ht 5\' 7"  (1.702 m)   Wt 163 lb (73.9 kg)   SpO2 99%   BMI 25.53 kg/m   Body mass index is 25.53 kg/m.  Advanced Directives 05/23/2019 11/26/2017 05/04/2017 04/24/2017 06/30/2016 05/02/2015 06/17/2014  Does Patient Have a Medical Advance Directive? Yes Yes Yes Yes Yes Yes Yes  Type of Paramedic of [place];Living will [place];Living will [place];Living will [place];Living will [place];Living will Living will Living will;Healthcare Power of Attorney    Does patient want to make changes to medical advance directive? Yes (ED - Information included in AVS) - No - Patient declined - - No - Patient declined -  Copy of [place] in Chart? No - copy requested No - copy requested No - copy requested - No - copy requested No - copy requested Yes    Tobacco Social History   Tobacco Use  Smoking Status Never Smoker  Smokeless Tobacco Never Used     Counseling given: Yes  Clinical Intake:  Pre-visit preparation completed: No  Pain : No/denies pain  BMI - recorded: 25.53 Nutritional Status: BMI 25 -29 Overweight Diabetes: No  How often do you need to have someone help you when you read instructions, pamphlets, or other written materials from your doctor or pharmacy?: 1 - Never  Interpreter Needed?: No  Past Medical History:  Diagnosis Date  . Bladder calculus   . GERD (gastroesophageal reflux disease)   . History of kidney stones   . Hypertension   . Spinal stenosis   . Urgency of urination   . Wears glasses    Past Surgical History:  Procedure Laterality Date  . BLEPHAROPLASTY    . EXTRACORPOREAL SHOCK WAVE LITHOTRIPSY  2004  . EYE SURGERY    . INGUINAL HERNIA REPAIR Left 1998  (spinal anesthesia)  . INGUINAL HERNIA REPAIR Right 05/04/2017   Procedure: OPEN RIGHT INGUINAL HERNIA REPAIR WITH MESH;  Surgeon: Judeth Horn, MD;  Location: [facility];  Service: General;  Laterality: Right;  . INSERTION OF MESH Right 05/04/2017   Procedure: INSERTION OF MESH;  Surgeon: Judeth Horn, MD;  Location: [facility];  Service: General;  Laterality: Right;  . TONSILLECTOMY  age 24   Family History  Problem Relation Age of Onset  . Breast cancer Mother   . Hypertension Mother   . Lung cancer Father    Social History   Socioeconomic History  . Marital status: Married    Spouse name: Not on file  . Number of children: Not on file  . Years of education: 23  . Highest education level:  Not on file  Occupational History  . Occupation: Retired   Tobacco Use  . Smoking status: Never Smoker  . Smokeless tobacco: Never Used  Substance and Sexual Activity  . Alcohol use: Yes    Alcohol/week: 14.0 standard drinks    Types: 14 Glasses of wine per week    Comment: 2 wine daily  . Drug use: No  . Sexual activity: Yes  Other Topics Concern  . 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 Difficulty of Paying Living Expenses: Not on file  Food Insecurity:   . Worried About Charity fundraiser in the Last Year: Not on file  . Ran Out of Food in the Last Year: Not on file  Transportation Needs:   . Lack of Transportation (Medical): Not on file  . Lack of Transportation (Non-Medical): Not on file  Physical Activity:   . Days of Exercise per Week: Not on file  . Minutes of Exercise per Session: Not on file  Stress:   . Feeling of Stress : Not on file  Social Connections:   . Frequency of Communication with Friends and Family: Not on file  . Frequency of Social Gatherings with Friends and Family: Not on file  . Attends Religious Services: Not on file  . Active Member of Clubs or Organizations: Not on file  . Attends Archivist Meetings: Not on file  . Marital Status: Not on file    Outpatient Encounter Medications as of 05/23/2019  Medication Sig  . Cholecalciferol (VITAMIN D3) 1000 UNITS CAPS Take 1 capsule by mouth daily.  Marland Kitchen doxycycline (VIBRAMYCIN) 100 MG capsule Take 1 capsule (100 mg total) by mouth 2 (two) times daily.  . finasteride (PROSCAR) 5 MG tablet TAKE 1 TABLET BY MOUTH EVERY DAY  . irbesartan (AVAPRO) 300 MG tablet Take 1 tablet (300 mg total) by mouth every morning.  . metoprolol succinate (TOPROL-XL) 25 MG 24 hr tablet TAKE 1 TABLET BY MOUTH EVERY EVENING  . Multiple Vitamin (MULTIVITAMIN WITH MINERALS) TABS tablet Take  1 tablet by mouth daily.  Marland Kitchen omeprazole (PRILOSEC) 40 MG capsule TAKE 1 CAPSULE (40 MG TOTAL) BY MOUTH DAILY AS NEEDED (HEART BURN).  . tamsulosin (FLOMAX) 0.4 MG CAPS capsule TAKE 1 CAPSULE BY MOUTH EVERY DAY   No facility-administered encounter medications on file as of 05/23/2019.  + Activities of Daily Living In your present state of health, do you have any difficulty performing the following activities: 05/23/2019 05/24/2018  Hearing? N N  Vision? N N  Difficulty concentrating or making decisions? N N  Walking or climbing stairs? N N  Dressing or bathing? N N  Doing errands, shopping? N N  Preparing Food and eating ? N -  Using the Toilet? N -  In the past six months, have you accidently leaked urine? N -  Do you have problems with loss of bowel control? N -  Managing  your Medications? N -  Managing your Finances? N -  Housekeeping or managing your Housekeeping? N -  Some recent data might be hidden    Patient Care Team: Delorse Limber as PCP - General (Family Medicine) Nickie Retort, MD as Consulting Physician (Urology) Jarome Matin, MD as Consulting Physician (Dermatology) Luberta Mutter, MD as Consulting Physician (Ophthalmology) Royston Cowper, [place] (Dentistry) Lorenda Cahill, DMD (Dentistry) Marybelle Killings, MD as Consulting Physician (Orthopedic Surgery)   Assessment:   This is a routine wellness examination for [place].  Exercise Activities and Dietary recommendations Current Exercise Habits: Home exercise routine, Type of exercise: walking, Time (Minutes): 45, Frequency (Times/Week): 3, Weekly Exercise (Minutes/Week): 135  Goals    . Stay healthy and enjoy my family     Continue to eat healthy, exercise, decrease stress by making a list at night and using stress reduction techniques       Fall Risk Fall Risk  05/23/2019 05/24/2018 11/26/2017 07/03/2017 06/30/2016  Falls in the past year? 0 0 No No No  Comment - - - - -  Number falls in past yr: 0 0 - - -   Injury with Fall? 0 0 - - -  Follow up Falls evaluation completed Falls evaluation completed - - -   Is the patient's home free of loose throw rugs in walkways, pet beds, electrical cords, etc?   yes      Grab bars in the bathroom? no      Handrails on the stairs?   yes      Adequate lighting?   yes   Depression Screen PHQ 2/9 Scores 05/23/2019 05/24/2018 11/26/2017 11/26/2017  PHQ - 2 Score 0 0 0 0  PHQ- 9 Score - 0 - 0    Cognitive Function MMSE - Mini Mental State Exam 11/26/2017  Orientation to time 5  Orientation to Place 5  Registration 3  Attention/ Calculation 3  Recall 3  Language- name 2 objects 2  Language- repeat 1  Language- follow 3 step command 3  Language- read & follow direction 1  Write a sentence 1  Copy design 1  Total score 28        Immunization History  Administered Date(s) Administered  . Fluad Quad(high Dose 65+) 01/25/2019  . Influenza, High Dose Seasonal PF 01/12/2017, 01/25/2018  . PFIZER SARS-COV-2 Vaccination 04/09/2019, 04/30/2019  . Pneumococcal Conjugate-13 04/28/2014, 11/26/2017  . Pneumococcal Polysaccharide-23 01/16/2012  . Tdap 06/06/2011  . Zoster Recombinat (Shingrix) 01/12/2017, 06/10/2017    Qualifies for Shingles Vaccine? UTD  Screening Tests Health Maintenance  Topic Date Due  . DTAP VACCINES (1) 03/23/1946  . COLONOSCOPY  02/01/2019  . DTaP/Tdap/Td (2 - Td) 06/05/2021  . TETANUS/TDAP  06/05/2021  . INFLUENZA VACCINE  Completed  . Hepatitis C Screening  Completed  . PNA vac Low Risk Adult  Completed   Cancer Screenings: Lung: Low Dose CT Chest recommended if Age 68-80 years, 30 pack-year currently smoking OR have quit w/in 15years. Patient does not qualify. Colorectal: Due. Average risk. Order placed.       Plan:  1. Encounter for Medicare annual wellness exam During the course of the visit the patient was educated and counseled about appropriate screening and preventive services including: Fall prevention, Bone  densitometry screening, Diabetes screening, Nutrition counseling.  Patient UTD on required immunizations. Just had both COVID vaccines. Will obtain fasting labs at today's visit.   Patient Instructions (the written plan) was given to the patient.  2. Colon cancer screening Average risk. Referral to [facility] placed per patient preference for screening colonoscopy.  - Ambulatory referral to Gastroenterology  3. Essential hypertension Normotensive. Asymptomatic. Continue current regimen. Repeat fasting labs today.  - Comprehensive metabolic panel - Lipid panel  4. Gastroesophageal reflux disease without esophagitis Controlled. Continue current regimen.   5. Prostate cancer screening The natural history of prostate cancer and ongoing controversy regarding screening and potential treatment outcomes of prostate cancer has been discussed with the patient. The meaning of a false positive PSA and a false negative PSA has been discussed. He indicates understanding of the limitations of this screening test and wishes to proceed with screening PSA testing.  - PSA  I have personally reviewed and noted the following in the patient's chart:   . Medical and social history . Use of alcohol, tobacco or illicit drugs  . Current medications and supplements . Functional ability and status . Nutritional status . Physical activity . Advanced directives . List of other physicians . Hospitalizations, surgeries, and ER visits in previous 12 months . Vitals . Screenings to include cognitive, depression, and falls . Referrals and appointments  In addition, I have reviewed and discussed with patient certain preventive protocols, quality metrics, and best practice recommendations. A written personalized care plan for preventive services as well as general preventive health recommendations were provided to patient.     Leeanne Rio, PA-C  05/23/2019

## 2019-05-23 NOTE — Telephone Encounter (Signed)
This pt is referred for a colonoscopy.  PCP stated that is a pt of Dr. Lyndel Safe from [facility].  Pt requested to transfer care to Dr. Henrene Pastor because his wife is Dr. Blanch Media pt.  Will Dr. Lyndel Safe approve the switch, and if so, advise recall.

## 2019-05-24 NOTE — Telephone Encounter (Signed)
Dr. Henrene Pastor, please see below.

## 2019-05-24 NOTE — Telephone Encounter (Signed)
Absolutely ?He will be in good hands ?RG ?

## 2019-05-24 NOTE — Telephone Encounter (Signed)
From Dr. Leland Her old records the last colonoscopy was in 2010. I will have Dr. Lyndel Safe review these records.

## 2019-05-24 NOTE — Telephone Encounter (Signed)
Anthony Collins, please advise recall.  I'm unable to access records from [facility].

## 2019-05-24 NOTE — Telephone Encounter (Signed)
My pleasure. Thanks! 

## 2019-05-30 NOTE — Telephone Encounter (Signed)
Per Dr Lyndel Safe after reviewing last report, patient was due November 2020.

## 2019-06-27 ENCOUNTER — Telehealth: Payer: Self-pay | Admitting: Physician Assistant

## 2019-06-27 NOTE — Progress Notes (Signed)
  Chronic Care Management   Note  06/27/2019 Name: Anthony Collins MRN: [id number] DOB: 05/20/1945  Anthony Collins is a 74 y.o. year old male who is a primary care patient of Delorse Limber. I reached out to Bing Ree by phone today in response to a referral sent by Mr. Anthony Collins's PCP, Brunetta Jeans, PA-C.   [name] was given information about Chronic Care Management services today including:  1. CCM service includes personalized support from designated clinical staff supervised by his physician, including individualized plan of care and coordination with other care providers 2. 24/7 contact phone numbers for assistance for urgent and routine care needs. 3. Service will only be billed when office clinical staff spend 20 minutes or more in a month to coordinate care. 4. Only one practitioner may furnish and bill the service in a calendar month. 5. The patient may stop CCM services at any time (effective at the end of the month) by phone call to the office staff.   Patient agreed to services and verbal consent obtained.   Follow up plan:   Earney [place] [facility] Scheduler

## 2019-06-30 ENCOUNTER — Encounter: Payer: Self-pay | Admitting: Nurse Practitioner

## 2019-07-01 ENCOUNTER — Ambulatory Visit: Payer: PPO

## 2019-07-01 DIAGNOSIS — N400 Enlarged prostate without lower urinary tract symptoms: Secondary | ICD-10-CM

## 2019-07-01 DIAGNOSIS — I10 Essential (primary) hypertension: Secondary | ICD-10-CM

## 2019-07-01 NOTE — Patient Instructions (Addendum)
Visit Information  Goals Addressed            This Visit's Progress   . PharmD Care Plan       CARE PLAN ENTRY  Current Barriers:  . Chronic Disease Management support, education, and care coordination needs related to HTN and BPH  Pharmacist Clinical Goal(s):  . BP <140/90  Interventions: . Comprehensive medication review performed.  Patient Self Care Activities:  . Patient verbalizes understanding of plan to check blood pressure readings 1-3 times/day for 2 weeks. Please call with any questions about your medications.  Initial goal documentation       Mr. Fargnoli was given information about Chronic Care Management services today including:  1. CCM service includes personalized support from designated clinical staff supervised by his physician, including individualized plan of care and coordination with other care providers 2. 24/7 contact phone numbers for assistance for urgent and routine care needs. 3. Standard insurance, coinsurance, copays and deductibles apply for chronic care management only during months in which we provide at least 20 minutes of these services. Most insurances cover these services at 100%, however patients may be responsible for any copay, coinsurance and/or deductible if applicable. This service may help you avoid the need for more expensive face-to-face services. 4. Only one practitioner may furnish and bill the service in a calendar month. 5. The patient may stop CCM services at any time (effective at the end of the month) by phone call to the office staff.  Patient agreed to services and verbal consent obtained.   The patient verbalized understanding of instructions provided today and agreed to receive a mailed copy of patient instruction and/or educational materials. Telephone follow up appointment with pharmacy team member scheduled for: See next appointment with "Care Management Staff" under "What's Next" below.   Thank you!  Madelin Rear,  Pharm.D. Clinical Pharmacist [facility] [phone]  Hypertension, Adult High blood pressure (hypertension) is when the force of blood pumping through the arteries is too strong. The arteries are the blood vessels that carry blood from the heart throughout the body. Hypertension forces the heart to work harder to pump blood and may cause arteries to become narrow or stiff. Untreated or uncontrolled hypertension can cause a heart attack, heart failure, a stroke, kidney disease, and other problems. A blood pressure reading consists of a higher number over a lower number. Ideally, your blood pressure should be below 120/80. The first ("top") number is called the systolic pressure. It is a measure of the pressure in your arteries as your heart beats. The second ("bottom") number is called the diastolic pressure. It is a measure of the pressure in your arteries as the heart relaxes. What are the causes? The exact cause of this condition is not known. There are some conditions that result in or are related to high blood pressure. What increases the risk? Some risk factors for high blood pressure are under your control. The following factors may make you more likely to develop this condition:  Smoking.  Having type 2 diabetes mellitus, high cholesterol, or both.  Not getting enough exercise or physical activity.  Being overweight.  Having too much fat, sugar, calories, or salt (sodium) in your diet.  Drinking too much alcohol. Some risk factors for high blood pressure may be difficult or impossible to change. Some of these factors include:  Having chronic kidney disease.  Having a family history of high blood pressure.  Age. Risk increases with  age.  Race. You may be at higher risk if you are African American.  Gender. Men are at higher risk than women before age 30. After age 26, women are at higher risk than men.  Having obstructive sleep  apnea.  Stress. What are the signs or symptoms? High blood pressure may not cause symptoms. Very high blood pressure (hypertensive crisis) may cause:  Headache.  Anxiety.  Shortness of breath.  Nosebleed.  Nausea and vomiting.  Vision changes.  Severe chest pain.  Seizures. How is this diagnosed? This condition is diagnosed by measuring your blood pressure while you are seated, with your arm resting on a flat surface, your legs uncrossed, and your feet flat on the floor. The cuff of the blood pressure monitor will be placed directly against the skin of your upper arm at the level of your heart. It should be measured at least twice using the same arm. Certain conditions can cause a difference in blood pressure between your right and left arms. Certain factors can cause blood pressure readings to be lower or higher than normal for a short period of time:  When your blood pressure is higher when you are in a health care provider's office than when you are at home, this is called white coat hypertension. Most people with this condition do not need medicines.  When your blood pressure is higher at home than when you are in a health care provider's office, this is called masked hypertension. Most people with this condition may need medicines to control blood pressure. If you have a high blood pressure reading during one visit or you have normal blood pressure with other risk factors, you may be asked to:  Return on a different day to have your blood pressure checked again.  Monitor your blood pressure at home for 1 week or longer. If you are diagnosed with hypertension, you may have other blood or imaging tests to help your health care provider understand your overall risk for other conditions. How is this treated? This condition is treated by making healthy lifestyle changes, such as eating healthy foods, exercising more, and reducing your alcohol intake. Your health care provider may  prescribe medicine if lifestyle changes are not enough to get your blood pressure under control, and if:  Your systolic blood pressure is above 130.  Your diastolic blood pressure is above 80. Your personal target blood pressure may vary depending on your medical conditions, your age, and other factors. Follow these instructions at home: Eating and drinking   Eat a diet that is high in fiber and potassium, and low in sodium, added sugar, and fat. An example eating plan is called the DASH (Dietary Approaches to Stop Hypertension) diet. To eat this way: ? Eat plenty of fresh fruits and vegetables. Try to fill one half of your plate at each meal with fruits and vegetables. ? Eat whole grains, such as whole-wheat pasta, brown rice, or whole-grain bread. Fill about one fourth of your plate with whole grains. ? Eat or drink low-fat dairy products, such as skim milk or low-fat yogurt. ? Avoid fatty cuts of meat, processed or cured meats, and poultry with skin. Fill about one fourth of your plate with lean proteins, such as fish, chicken without skin, beans, eggs, or tofu. ? Avoid pre-made and processed foods. These tend to be higher in sodium, added sugar, and fat.  Reduce your daily sodium intake. Most people with hypertension should eat less than 1,500 mg of sodium  a day.  Do not drink alcohol if: ? Your health care provider tells you not to drink. ? You are pregnant, may be pregnant, or are planning to become pregnant.  If you drink alcohol: ? Limit how much you use to:  0-1 drink a day for women.  0-2 drinks a day for men. ? Be aware of how much alcohol is in your drink. In the [place] one drink equals one 12 oz bottle of beer (355 mL), one 5 oz glass of wine (148 mL), or one 1 oz glass of hard liquor (44 mL). Lifestyle   Work with your health care provider to maintain a healthy body weight or to lose weight. Ask what an ideal weight is for you.  Get at least 30 minutes of exercise  most days of the week. Activities may include walking, swimming, or biking.  Include exercise to strengthen your muscles (resistance exercise), such as Pilates or lifting weights, as part of your weekly exercise routine. Try to do these types of exercises for 30 minutes at least 3 days a week.  Do not use any products that contain nicotine or tobacco, such as cigarettes, e-cigarettes, and chewing tobacco. If you need help quitting, ask your health care provider.  Monitor your blood pressure at home as told by your health care provider.  Keep all follow-up visits as told by your health care provider. This is important. Medicines  Take over-the-counter and prescription medicines only as told by your health care provider. Follow directions carefully. Blood pressure medicines must be taken as prescribed.  Do not skip doses of blood pressure medicine. Doing this puts you at risk for problems and can make the medicine less effective.  Ask your health care provider about side effects or reactions to medicines that you should watch for. Contact a health care provider if you:  Think you are having a reaction to a medicine you are taking.  Have headaches that keep coming back (recurring).  Feel dizzy.  Have swelling in your ankles.  Have trouble with your vision. Get help right away if you:  Develop a severe headache or confusion.  Have unusual weakness or numbness.  Feel faint.  Have severe pain in your chest or abdomen.  Vomit repeatedly.  Have trouble breathing. Summary  Hypertension is when the force of blood pumping through your arteries is too strong. If this condition is not controlled, it may put you at risk for serious complications.  Your personal target blood pressure may vary depending on your medical conditions, your age, and other factors. For most people, a normal blood pressure is less than 120/80.  Hypertension is treated with lifestyle changes, medicines, or a  combination of both. Lifestyle changes include losing weight, eating a healthy, low-sodium diet, exercising more, and limiting alcohol. This information is not intended to replace advice given to you by your health care provider. Make sure you discuss any questions you have with your health care provider. Document Revised: 11/18/2017 Document Reviewed: 11/18/2017 Elsevier Patient Education  2020 [employer].

## 2019-07-01 NOTE — Progress Notes (Signed)
Chronic Care Management Pharmacy  Name: Anthony Collins  MRN: [id number] DOB: 1945-10-09  Chief Complaint/ HPI Anthony Collins,  74 y.o. , male presents for their Initial CCM visit with the clinical pharmacist via telephone due to COVID-19 Pandemic.  PCP : Brunetta Jeans, PA-C Their chronic conditions include: BPH, HTN, GERD  Outpatient Encounter Medications as of 07/01/2019  Medication Sig  . Bacillus Coagulans-Inulin (PROBIOTIC FORMULA PO) Take by mouth.  . Cholecalciferol (VITAMIN D3) 1000 UNITS CAPS Take 1 capsule by mouth daily.  Marland Kitchen doxycycline (VIBRAMYCIN) 100 MG capsule Take 1 capsule (100 mg total) by mouth 2 (two) times daily.  . finasteride (PROSCAR) 5 MG tablet TAKE 1 TABLET BY MOUTH EVERY DAY  . irbesartan (AVAPRO) 300 MG tablet Take 1 tablet (300 mg total) by mouth every morning.  . metoprolol succinate (TOPROL-XL) 25 MG 24 hr tablet TAKE 1 TABLET BY MOUTH EVERY EVENING  . Multiple Vitamin (MULTIVITAMIN WITH MINERALS) TABS tablet Take 1 tablet by mouth daily.  Marland Kitchen omeprazole (PRILOSEC) 40 MG capsule TAKE 1 CAPSULE (40 MG TOTAL) BY MOUTH DAILY AS NEEDED (HEART BURN).  . tamsulosin (FLOMAX) 0.4 MG CAPS capsule TAKE 1 CAPSULE BY MOUTH EVERY DAY   No facility-administered encounter medications on file as of 07/01/2019.   Current Diagnosis/Assessment:  Goals Addressed            This Visit's Progress   . PharmD Care Plan       CARE PLAN ENTRY  Current Barriers:  . Chronic Disease Management support, education, and care coordination needs related to HTN and BPH  Pharmacist Clinical Goal(s):  . BP <140/90  Interventions: . Comprehensive medication review performed.  Patient Self Care Activities:  . Patient verbalizes understanding of plan to check blood pressure readings 1-3 times/day for 2 weeks. Please call with any questions about your medications.  Initial goal documentation       Hypertension  BP today is:  <140/90  Office blood pressures are  BP  Readings from Last 3 Encounters:  05/23/19 128/78  11/22/18 122/74  05/24/18 120/82   Patient is currently controlled on the following medications: irbesartan 300 mg every morning, metoprolol succinate 25 mg every evening. Mentions morning BP higher than normal 120s/70s. Patient home BP readings are ranging: 130s/80s typically, 0000000 systolic on occasion. Denies dizziness, SOB, chest pain. Asks about nightly dosing of metoprolol and will start taking nightly as currently prescribed and continue irbesartan every morning. We agreed on a monitoring plan for patient to test BP at home once to three times daily over next 2 weeks.   Plan Continue irbesartan every morning, take metoprolol succinate every evening.    GERD  Patient denies GERD associated symptoms including dysphagia, heartburn or nausea over the past month. Currently takes omeprazole (Prilosec) 40 mg daily.  Expresses understanding to avoid triggers such [id number] and tomato sauce.  Plan  Continue current medication.   BPH   Lab Results  Component Value Date   PSA 0.69 05/23/2019   PSA 0.53 05/24/2018   PSA 0.74 07/03/2017   Currently taking finasteride 5 mg daily, tamsulosin 0.4 mg daily. Reports frequent urination, getting up ~2x/night. Mentions urinary frequency likely tied to consuming large amounts of water throughout day to avoid kidney stones. Denies any issues voiding, including weak stream, strain, sensation of not being able to empty bladder.  We discussed the dosing of current medication for BPH and patient was counseled on appropriate use. Expresses great understanding of medications.  Plan Continue current medications  Vaccines  Reviewed and discussed patient's vaccination history. Up to date. Tdap due 05/2021.  Immunization History  Administered Date(s) Administered  . Fluad Quad(high Dose 65+) 01/25/2019  . Influenza, High Dose Seasonal PF 01/12/2017, 01/25/2018  . PFIZER SARS-COV-2 Vaccination  04/09/2019, 04/30/2019  . Pneumococcal Conjugate-13 04/28/2014, 11/26/2017  . Pneumococcal Polysaccharide-23 01/16/2012  . Tdap 06/06/2011  . Zoster Recombinat (Shingrix) 01/12/2017, 06/10/2017   Plan No recommendation at this time.  Visit Information Anthony Collins was given information about Chronic Care Management services today including:  1. CCM service includes personalized support from designated clinical staff supervised by his physician, including individualized plan of care and coordination with other care providers 2. 24/7 contact phone numbers for assistance for urgent and routine care needs. 3. Standard insurance, coinsurance, copays and deductibles apply for chronic care management only during months in which we provide at least 20 minutes of these services. Most insurances cover these services at 100%, however patients may be responsible for any copay, coinsurance and/or deductible if applicable. This service may help you avoid the need for more expensive face-to-face services. 4. Only one practitioner may furnish and bill the service in a calendar month. 5. The patient may stop CCM services at any time (effective at the end of the month) by phone call to the office staff.  Patient agreed to services and verbal consent obtained.   Madelin Rear, Pharm.D. Clinical Pharmacist [facility] [phone]

## 2019-07-06 ENCOUNTER — Encounter: Payer: Self-pay | Admitting: Internal Medicine

## 2019-07-06 ENCOUNTER — Other Ambulatory Visit: Payer: Self-pay | Admitting: Physician Assistant

## 2019-07-06 ENCOUNTER — Ambulatory Visit: Payer: PPO | Admitting: Internal Medicine

## 2019-07-06 VITALS — HR 75 | Temp 98.2°F | Ht 68.0 in | Wt 159.0 lb

## 2019-07-06 DIAGNOSIS — Z1211 Encounter for screening for malignant neoplasm of colon: Secondary | ICD-10-CM | POA: Diagnosis not present

## 2019-07-06 DIAGNOSIS — K219 Gastro-esophageal reflux disease without esophagitis: Secondary | ICD-10-CM

## 2019-07-06 DIAGNOSIS — I10 Essential (primary) hypertension: Secondary | ICD-10-CM

## 2019-07-06 DIAGNOSIS — R1310 Dysphagia, unspecified: Secondary | ICD-10-CM | POA: Diagnosis not present

## 2019-07-06 MED ORDER — NA SULFATE-K SULFATE-MG SULF 17.5-3.13-1.6 GM/177ML PO SOLN: 1.0000 | Freq: Once | ORAL | 0 refills | Status: AC

## 2019-07-06 NOTE — Progress Notes (Signed)
HISTORY OF PRESENT ILLNESS:  [name] is a very pleasant 74 y.o. male, husband of Anthony Collins, who presents today regarding GERD, new onset dysphagia, and the need for colon cancer screening.  He is new to this office.  The patient underwent colonoscopy with Dr. Lyndel Collins in 2010.  Follow-up in 10 years recommended.  He is having no lower GI complaints such as change in bowel habits or bleeding.  He does have chronic GERD for which he takes Prilosec 40 mg daily sporadically.  Over the past 6 months he has had intermittent solid food dysphagia.  No weight loss.  He has received his Covid vaccination x2.  GI review of systems is otherwise negative.  No family history of colon cancer.  Review of outside laboratories finds unremarkable comprehensive metabolic panel from May 23, 2019.  Review of x-ray file shows no relevant abnormalities to report.  REVIEW OF SYSTEMS:  All non-GI ROS negative unless otherwise stated in the HPI.  Past Medical History:  Diagnosis Date  . Bladder calculus   . GERD (gastroesophageal reflux disease)   . History of kidney stones   . Hypertension   . Spinal stenosis   . Urgency of urination   . Wears glasses     Past Surgical History:  Procedure Laterality Date  . BLEPHAROPLASTY    . EXTRACORPOREAL SHOCK WAVE LITHOTRIPSY  2004  . EYE SURGERY    . INGUINAL HERNIA REPAIR Left 1998  (spinal anesthesia)  . INGUINAL HERNIA REPAIR Right 05/04/2017   Procedure: OPEN RIGHT INGUINAL HERNIA REPAIR WITH MESH;  Surgeon: Anthony Horn, MD;  Location: [facility];  Service: General;  Laterality: Right;  . INSERTION OF MESH Right 05/04/2017   Procedure: INSERTION OF MESH;  Surgeon: Anthony Horn, MD;  Location: [facility];  Service: General;  Laterality: Right;  . TONSILLECTOMY  age 72    Social History Anthony Collins  reports that he has never smoked. He has never used smokeless tobacco. He reports current alcohol use of about 14.0 standard drinks  of alcohol per week. He reports that he does not use drugs.  family history includes Breast cancer in his mother; Hypertension in his mother; Lung cancer in his father.  No Known Allergies     PHYSICAL EXAMINATION: Vital signs: Pulse 75   Temp 98.2 F (36.8 C)   Ht 5\' 8"  (1.727 m)   Wt 159 lb (72.1 kg)   BMI 24.18 kg/m   Constitutional: Pleasant, generally well-appearing, no acute distress Psychiatric: alert and oriented x3, cooperative Eyes: extraocular movements intact, anicteric, conjunctiva pink Mouth: oral pharynx moist, no lesions Neck: supple no lymphadenopathy Cardiovascular: heart regular rate and rhythm, no murmur Lungs: clear to auscultation bilaterally Abdomen: soft, nontender, nondistended, no obvious ascites, no peritoneal signs, normal bowel sounds, no organomegaly Rectal: Deferred until colonoscopy Extremities: no clubbing, cyanosis, or lower extremity edema bilaterally Skin: no lesions on visible extremities Neuro: No focal deficits.  Cranial nerves intact  ASSESSMENT:  1.  Chronic GERD.  Takes PPI sporadically 2.  27-month history of intermittent solid food dysphagia.  Rule out peptic stricture 3.  Colon cancer screening.  Negative examination 2010.  Due for follow-up 4.  General medical problems.  Stable   PLAN:  1.  Reflux precautions 2.  Advised to take omeprazole 40 mg DAILY.  Medication risks reviewed 3.  Schedule upper endoscopy with probable esophageal dilation.The nature of the procedure, as well as the risks, benefits, and alternatives were carefully  and thoroughly reviewed with the patient. Ample time for discussion and questions allowed. The patient understood, was satisfied, and agreed to proceed. 4.  Schedule screening colonoscopy with polypectomy if indicated.The nature of the procedure, as well as the risks, benefits, and alternatives were carefully and thoroughly reviewed with the patient. Ample time for discussion and questions allowed. The  patient understood, was satisfied, and agreed to proceed. A total time of 60 minutes was spent preparing to see the patient, reviewing outside tests and prior endoscopy reports, obtaining comprehensive history and performing comprehensive medical examination.  Also counseling the patient regarding the above listed issues, ordering medications and advanced procedures, and documenting clinical information in the health record.

## 2019-07-06 NOTE — Patient Instructions (Signed)
You have been scheduled for an endoscopy and colonoscopy. Please follow the written instructions given to you at your visit today. Please pick up your prep supplies at the pharmacy within the next 1-3 days. If you use inhalers (even only as needed), please bring them with you on the day of your procedure.  

## 2019-07-15 ENCOUNTER — Encounter: Payer: Self-pay | Admitting: Internal Medicine

## 2019-07-18 LAB — FECAL OCCULT BLOOD, IMMUNOCHEMICAL: IFOBT: NEGATIVE

## 2019-07-26 ENCOUNTER — Encounter: Payer: Self-pay | Admitting: Emergency Medicine

## 2019-07-28 ENCOUNTER — Other Ambulatory Visit: Payer: Self-pay

## 2019-07-28 ENCOUNTER — Ambulatory Visit (AMBULATORY_SURGERY_CENTER): Payer: PPO | Admitting: Internal Medicine

## 2019-07-28 ENCOUNTER — Encounter: Payer: Self-pay | Admitting: Internal Medicine

## 2019-07-28 VITALS — BP 127/66 | HR 56 | Temp 97.1°F | Resp 17 | Ht 68.0 in | Wt 159.0 lb

## 2019-07-28 DIAGNOSIS — I10 Essential (primary) hypertension: Secondary | ICD-10-CM | POA: Diagnosis not present

## 2019-07-28 DIAGNOSIS — R1310 Dysphagia, unspecified: Secondary | ICD-10-CM | POA: Diagnosis not present

## 2019-07-28 DIAGNOSIS — K222 Esophageal obstruction: Secondary | ICD-10-CM

## 2019-07-28 DIAGNOSIS — K219 Gastro-esophageal reflux disease without esophagitis: Secondary | ICD-10-CM | POA: Diagnosis not present

## 2019-07-28 DIAGNOSIS — Z1211 Encounter for screening for malignant neoplasm of colon: Secondary | ICD-10-CM

## 2019-07-28 DIAGNOSIS — D122 Benign neoplasm of ascending colon: Secondary | ICD-10-CM | POA: Diagnosis not present

## 2019-07-28 MED ORDER — SODIUM CHLORIDE 0.9 % IV SOLN: 500.0000 mL | Freq: Once | INTRAVENOUS | Status: DC

## 2019-07-28 NOTE — Patient Instructions (Signed)
HANDOUTS PROVIDED ON: POST DILATION DIET, HIATAL HERNIA, POLYPS, & DIVERTICULOSIS    You may resume your previous medication schedule.  Continue taking the 40 mg Omeprazole daily.  Thank you for allowing [place] to care for you today!!!   YOU HAD AN ENDOSCOPIC PROCEDURE TODAY AT [place]:   Refer to the procedure report that was given to you for any specific questions about what was found during the examination.  If the procedure report does not answer your questions, please call your gastroenterologist to clarify.  If you requested that your care partner not be given the details of your procedure findings, then the procedure report has been included in a sealed envelope for you to review at your convenience later.  YOU SHOULD EXPECT: Some feelings of bloating in the abdomen. Passage of more gas than usual.  Walking can help get rid of the air that was put into your GI tract during the procedure and reduce the bloating. If you had a lower endoscopy (such as a colonoscopy or flexible sigmoidoscopy) you may notice spotting of blood in your stool or on the toilet paper. If you underwent a bowel prep for your procedure, you may not have a normal bowel movement for a few days.  Please Note:  You might notice some irritation and congestion in your nose or some drainage.  This is from the oxygen used during your procedure.  There is no need for concern and it should clear up in a day or so.  SYMPTOMS TO REPORT IMMEDIATELY:   Following lower endoscopy (colonoscopy or flexible sigmoidoscopy):  Excessive amounts of blood in the stool  Significant tenderness or worsening of abdominal pains  Swelling of the abdomen that is new, acute  Fever of 100F or higher   Following upper endoscopy (EGD)  Vomiting of blood or coffee ground material  New chest pain or pain under the shoulder blades  Painful or persistently difficult swallowing  New shortness of breath  Fever of 100F or  higher  Black, tarry-looking stools  For urgent or emergent issues, a gastroenterologist can be reached at any hour by calling [phone]. Do not use MyChart messaging for urgent concerns.    DIET:  We do recommend post dilation diet today and tomorrow start with a small meal at first, but then you may proceed to your regular diet.  Drink plenty of fluids but you should avoid alcoholic beverages for 24 hours.  ACTIVITY:  You should plan to take it easy for the rest of today and you should NOT DRIVE or use heavy machinery until tomorrow (because of the sedation medicines used during the test).    FOLLOW UP: Our staff will call the number listed on your records 48-72 hours following your procedure to check on you and address any questions or concerns that you may have regarding the information given to you following your procedure. If we do not reach you, we will leave a message.  We will attempt to reach you two times.  During this call, we will ask if you have developed any symptoms of COVID 19. If you develop any symptoms (ie: fever, flu-like symptoms, shortness of breath, cough etc.) before then, please call [phone].  If you test positive for Covid 19 in the 2 weeks post procedure, please call and report this information to [place].    If any biopsies were taken you will be contacted by phone or by letter within the next 1-3 weeks.  Please  call us at [phone] if you have not heard about the biopsies in 3 weeks.    SIGNATURES/CONFIDENTIALITY: You and/or your care partner have signed paperwork which will be entered into your electronic medical record.  These signatures attest to the fact that that the information above on your After Visit Summary has been reviewed and is understood.  Full responsibility of the confidentiality of this discharge information lies with you and/or your care-partner.

## 2019-07-28 NOTE — Progress Notes (Signed)
Report to PACU, RN, vss, BBS= Clear.  

## 2019-07-28 NOTE — Progress Notes (Signed)
Called to room to assist during endoscopic procedure.  Patient ID and intended procedure confirmed with present staff. Received instructions for my participation in the procedure from the performing physician.  

## 2019-07-28 NOTE — Op Note (Signed)
[facility] Patient Name: Anthony Collins Procedure Date: 07/28/2019 1:48 PM MRN: [id number] Endoscopist: Docia Chuck. Henrene Pastor , MD Age: 74 Referring MD:  Date of Birth: 08-Dec-1945 Gender: Male Account #: [id number] Procedure:                Colonoscopy with cold snare polypectomy x 1 Indications:              Screening for colorectal malignant neoplasm. Index                            examination 2010 (Dr. Lyndel Safe) was negative for                            neoplasia Medicines:                Monitored Anesthesia Care Procedure:                Pre-Anesthesia Assessment:                           - Prior to the procedure, a History and Physical                            was performed, and patient medications and                            allergies were reviewed. The patient's tolerance of                            previous anesthesia was also reviewed. The risks                            and benefits of the procedure and the sedation                            options and risks were discussed with the patient.                            All questions were answered, and informed consent                            was obtained. Prior Anticoagulants: The patient has                            taken no previous anticoagulant or antiplatelet                            agents. ASA Grade Assessment: II - A patient with                            mild systemic disease. After reviewing the risks                            and benefits, the patient was deemed in  satisfactory condition to undergo the procedure.                           After obtaining informed consent, the colonoscope                            was passed under direct vision. Throughout the                            procedure, the patient's blood pressure, pulse, and                            oxygen saturations were monitored continuously. The                            Colonoscope was  introduced through the anus and                            advanced to the the cecum, identified by                            appendiceal orifice and ileocecal valve. The                            ileocecal valve, appendiceal orifice, and rectum                            were photographed. The quality of the bowel                            preparation was excellent. The colonoscopy was                            performed without difficulty. The patient tolerated                            the procedure well. The bowel preparation used was                            SUPREP via split dose instruction. Scope In: 1:56:28 PM Scope Out: 2:08:47 PM Scope Withdrawal Time: 0 hours 10 minutes 16 seconds  Total Procedure Duration: 0 hours 12 minutes 19 seconds  Findings:                 A 3 mm polyp was found in the ascending colon. The                            polyp was sessile. The polyp was removed with a                            cold snare. Resection was complete. No meaningful                            tissue available for submission as the  polyp was so                            small.                           Multiple diverticula were found in the sigmoid                            colon.                           The exam was otherwise without abnormality on                            direct and retroflexion views. Complications:            No immediate complications. Estimated blood loss:                            None. Estimated Blood Loss:     Estimated blood loss: none. Impression:               - One 3 mm polyp in the ascending colon, removed                            with a cold snare.                           - Diverticulosis in the sigmoid colon.                           - The examination was otherwise normal on direct                            and retroflexion views. Recommendation:           - Repeat colonoscopy is not recommended for                             surveillance (given favorable findings and current                            age).                           - Patient has a contact number available for                            emergencies. The signs and symptoms of potential                            delayed complications were discussed with the                            patient. Return to normal activities tomorrow.  Written discharge instructions were provided to the                            patient.                           - Resume previous diet.                           - Continue present medications. Docia Chuck. Henrene Pastor, MD 07/28/2019 2:12:32 PM This report has been signed electronically.

## 2019-07-28 NOTE — Op Note (Signed)
[facility] Patient Name: Anthony Collins Procedure Date: 07/28/2019 1:48 PM MRN: [id number] Endoscopist: Docia Chuck. Henrene Pastor , MD Age: 74 Referring MD:  Date of Birth: 1945-06-24 Gender: Male Account #: [id number] Procedure:                Upper GI endoscopy with [facility] dilation of the                            esophagus. 72 [address] Indications:              Dysphagia, Esophageal reflux Medicines:                Monitored Anesthesia Care Procedure:                Pre-Anesthesia Assessment:                           - Prior to the procedure, a History and Physical                            was performed, and patient medications and                            allergies were reviewed. The patient's tolerance of                            previous anesthesia was also reviewed. The risks                            and benefits of the procedure and the sedation                            options and risks were discussed with the patient.                            All questions were answered, and informed consent                            was obtained. Prior Anticoagulants: The patient has                            taken no previous anticoagulant or antiplatelet                            agents. ASA Grade Assessment: II - A patient with                            mild systemic disease. After reviewing the risks                            and benefits, the patient was deemed in                            satisfactory condition to undergo the procedure.  After obtaining informed consent, the endoscope was                            passed under direct vision. Throughout the                            procedure, the patient's blood pressure, pulse, and                            oxygen saturations were monitored continuously. The                            Endoscope was introduced through the mouth, and                            advanced to the second part of  duodenum. The upper                            GI endoscopy was accomplished without difficulty.                            The patient tolerated the procedure well. Scope In: Scope Out: Findings:                 One benign-appearing, intrinsic moderate stenosis                            was found 38 cm from the incisors. This stenosis                            measured 1.5 cm (inner diameter). After completing                            the endoscopic survey, the scope was withdrawn.                            Dilation was performed with a Maloney dilator with                            no resistance at 70 Fr.                           The exam of the esophagus was otherwise normal.                           The stomach was normal except for moderate sliding                            hiatal hernia and a few diminutive benign fundic                            gland type polyps.  The examined duodenum was normal.                           The cardia and gastric fundus were normal on                            retroflexion. Complications:            No immediate complications. Estimated Blood Loss:     Estimated blood loss: none. Impression:               1. GERD with esophageal stricture status post                            dilation. Recommendation:           1. Post dilation diet                           2. Resume previous medications                           3. Take your omeprazole 40 mg DAILY                           4. Office follow-up with Dr. Henrene Pastor in 6 weeks. Docia Chuck. Henrene Pastor, MD 07/28/2019 2:26:07 PM This report has been signed electronically.

## 2019-08-01 ENCOUNTER — Telehealth: Payer: Self-pay

## 2019-08-01 NOTE — Telephone Encounter (Signed)
  Follow up Call-  Call back number 07/28/2019  Post procedure Call Back phone  # [phone]  Permission to leave phone message Yes  Some recent data might be hidden     Patient questions:  Do you have a fever, pain , or abdominal swelling? No. Pain Score  0 *  Have you tolerated food without any problems? Yes.    Have you been able to return to your normal activities? Yes.    Do you have any questions about your discharge instructions: Diet   No. Medications  No. Follow up visit  No.  Do you have questions or concerns about your Care? No.  Actions: * If pain score is 4 or above: No action needed, pain <4.   1. Have you developed a fever since your procedure? No  2.   Have you had an respiratory symptoms (SOB or cough) since your procedure? No  3.   Have you tested positive for COVID 19 since your procedure No  4.   Have you had any family members/close contacts diagnosed with the COVID 19 since your procedure?  No   If yes to any of these questions please route to Joylene John, RN and Erenest Rasher, RN

## 2019-08-28 ENCOUNTER — Other Ambulatory Visit: Payer: Self-pay | Admitting: Physician Assistant

## 2019-08-28 DIAGNOSIS — R350 Frequency of micturition: Secondary | ICD-10-CM

## 2019-09-05 ENCOUNTER — Ambulatory Visit (INDEPENDENT_AMBULATORY_CARE_PROVIDER_SITE_OTHER): Payer: PPO | Admitting: Physician Assistant

## 2019-09-05 ENCOUNTER — Other Ambulatory Visit: Payer: Self-pay

## 2019-09-05 ENCOUNTER — Encounter: Payer: Self-pay | Admitting: Physician Assistant

## 2019-09-05 VITALS — BP 144/86 | HR 78 | Temp 98.3°F | Ht 68.0 in | Wt 155.8 lb

## 2019-09-05 DIAGNOSIS — I959 Hypotension, unspecified: Secondary | ICD-10-CM

## 2019-09-05 LAB — COMPREHENSIVE METABOLIC PANEL
ALT: 15 U/L (ref 0–53)
AST: 19 U/L (ref 0–37)
Albumin: 4.6 g/dL (ref 3.5–5.2)
Alkaline Phosphatase: 55 U/L (ref 39–117)
BUN: 20 mg/dL (ref 6–23)
CO2: 30 mEq/L (ref 19–32)
Calcium: 9.6 mg/dL (ref 8.4–10.5)
Chloride: 102 mEq/L (ref 96–112)
Creatinine, Ser: 1 mg/dL (ref 0.40–1.50)
GFR: 73.12 mL/min (ref >60.00)
Glucose, Bld: 123 mg/dL — ABNORMAL HIGH (ref 70–99)
Potassium: 4.5 mEq/L (ref 3.5–5.1)
Sodium: 139 mEq/L (ref 135–145)
Total Bilirubin: 0.9 mg/dL (ref 0.2–1.2)
Total Protein: 7.3 g/dL (ref 6.0–8.3)

## 2019-09-05 LAB — CBC WITH DIFFERENTIAL/PLATELET
Basophils Absolute: 0 10*3/uL (ref 0.0–0.1)
Basophils Relative: 0.3 % (ref 0.0–3.0)
Eosinophils Absolute: 0.1 10*3/uL (ref 0.0–0.7)
Eosinophils Relative: 1.1 % (ref 0.0–5.0)
HCT: 40.1 % (ref 39.0–52.0)
Hemoglobin: 13.9 g/dL (ref 13.0–17.0)
Lymphocytes Relative: 18.9 % (ref 12.0–46.0)
Lymphs Abs: 0.9 10*3/uL (ref 0.7–4.0)
MCHC: 34.7 g/dL (ref 30.0–36.0)
MCV: 95.6 fl (ref 78.0–100.0)
Monocytes Absolute: 0.3 10*3/uL (ref 0.1–1.0)
Monocytes Relative: 6.6 % (ref 3.0–12.0)
Neutro Abs: 3.4 10*3/uL (ref 1.4–7.7)
Neutrophils Relative %: 73.1 % (ref 43.0–77.0)
Platelets: 259 10*3/uL (ref 150.0–400.0)
RBC: 4.19 Mil/uL — ABNORMAL LOW (ref 4.22–5.81)
RDW: 13 % (ref 11.5–15.5)
WBC: 4.6 10*3/uL (ref 4.0–10.5)

## 2019-09-05 LAB — TSH: TSH: 2.43 u[IU]/mL (ref 0.35–4.50)

## 2019-09-05 NOTE — Patient Instructions (Signed)
Please go to the lab today for blood work.  I will call you with your results. We will alter treatment regimen(s) if indicated by your results.   For now, giving the significant drops in BP by midday I want you to decrease your Irbesartan (Avapro) to 1/2 tablet (150 mg) each morning.  Continue the Metoprolol at regular dose. Keep hydrated and eat a well-balanced diet.  Keep checking BP each morning and then again in early afternoon. Monitor symptoms -- I am hoping these will resolve and BP will remain more stable. Want to keep close eye on morning BP -- If climbing but other symptoms resolve we may need to adjust nighttime dose of Metoprolol.  Follow-up with me in 1 week. Sooner if needed.

## 2019-09-05 NOTE — Progress Notes (Signed)
Patient presents to clinic today c/o episodes of lower BP and increased heart rate occurring over the past 1.5 months. Notes he keeps check on BP each morning giving history of hypertension. Notes averaging in 892J systolic typically. Notes taking medications in the morning (Avapro and Toprol XL) and by early afternoon will have episode of lower BP (90-110/60-80) with increased heart rate that he can feel (90s-110). Denies any lightheadedness, dizziness, SOB or chest pain. Denies sweating. Endorses not having issue when active -- taking out trash, yard work, Social research officer, government. Research officer, political party well hydrated and endorses a well-balanced diet overall. Has limited caffeine since this started.   Past Medical History:  Diagnosis Date  . Bladder calculus   . GERD (gastroesophageal reflux disease)   . History of kidney stones   . Hypertension   . Spinal stenosis   . Urgency of urination   . Wears glasses     Current Outpatient Medications on File Prior to Visit  Medication Sig Dispense Refill  . Bacillus Coagulans-Inulin (PROBIOTIC FORMULA PO) Take by mouth.    . Cholecalciferol (VITAMIN D3) 1000 UNITS CAPS Take 1 capsule by mouth daily.    . finasteride (PROSCAR) 5 MG tablet TAKE 1 TABLET BY MOUTH EVERY DAY 90 tablet 1  . irbesartan (AVAPRO) 300 MG tablet TAKE 1 TABLET BY MOUTH EVERY DAY IN THE MORNING 90 tablet 1  . metoprolol succinate (TOPROL-XL) 25 MG 24 hr tablet TAKE 1 TABLET BY MOUTH EVERY EVENING 90 tablet 1  . Multiple Vitamin (MULTIVITAMIN WITH MINERALS) TABS tablet Take 1 tablet by mouth daily.    Marland Kitchen omeprazole (PRILOSEC) 40 MG capsule TAKE 1 CAPSULE (40 MG TOTAL) BY MOUTH DAILY AS NEEDED (HEART BURN). 90 capsule 1  . tamsulosin (FLOMAX) 0.4 MG CAPS capsule TAKE 1 CAPSULE BY MOUTH EVERY DAY 90 capsule 1   No current facility-administered medications on file prior to visit.    No Known Allergies  Family History  Problem Relation Age of Onset  . Breast cancer Mother   . Hypertension Mother   . Lung  cancer Father     Social History   Socioeconomic History  . Marital status: Married    Spouse name: Not on file  . Number of children: Not on file  . Years of education: 17  . Highest education level: Not on file  Occupational History  . Occupation: Retired   Tobacco Use  . Smoking status: Never Smoker  . Smokeless tobacco: Never Used  Vaping Use  . Vaping Use: Never used  Substance and Sexual Activity  . Alcohol use: Yes    Alcohol/week: 14.0 standard drinks    Types: 14 Glasses of wine per week    Comment: 2 wine daily  . Drug use: No  . Sexual activity: Yes  Other Topics Concern  . 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 Difficulty of Paying Living Expenses:   Food Insecurity:   . Worried About Charity fundraiser in the Last Year:   . Arboriculturist in the Last Year:   Transportation Needs:   . Film/video editor (Medical):   Marland Kitchen Lack of Transportation (Non-Medical):   Physical Activity:   . Days of Exercise per Week:   . Minutes of Exercise per Session:   Stress:   . Feeling of Stress :   Social Connections:   .  Frequency of Communication with Friends and Family:   . Frequency of Social Gatherings with Friends and Family:   . Attends Religious Services:   . Active Member of Clubs or Organizations:   . Attends Archivist Meetings:   Marland Kitchen Marital Status:    Review of Systems - See HPI.  All other ROS are negative.  BP (!) 144/86   Pulse 78   Temp 98.3 F (36.8 C)   Ht _0  (1.727 m)   Wt 155 lb 12.8 oz (70.7 kg)   SpO2 99%   BMI 23.69 kg/m   Physical Exam Vitals reviewed.  Constitutional:      Appearance: Normal appearance.  HENT:     Head: Normocephalic and atraumatic.  Eyes:     Conjunctiva/sclera: Conjunctivae normal.     Pupils: Pupils are equal, round, and reactive to light.  Cardiovascular:      Rate and Rhythm: Normal rate and regular rhythm.     Pulses: Normal pulses.     Heart sounds: Normal heart sounds.  Pulmonary:     Effort: Pulmonary effort is normal.     Breath sounds: Normal breath sounds.  Musculoskeletal:     Cervical back: Neck supple.  Neurological:     General: No focal deficit present.     Mental Status: He is alert and oriented to person, place, and time.  Psychiatric:        Mood and Affect: Mood normal.    Recent Results (from the past 2160 hour(s))  Fecal occult blood, imunochemical     Status: None   Collection Time: 07/18/19 12:00 AM  Result Value Ref Range   IFOBT Negative    Assessment/Plan: 1. Hypotensive episode Suspect dosing is too high. BP elevated today as he has not taken medications. Negative OVS. Examination unremarkable. Will check labs today to include CBC, CMP and TSH. Will decrease Avapro to 150 mg daily. Continue Toprol at current dose. Keep close check on BP, HR and any symptoms over next week. Follow-up 1 weeks. Strict ER precautions reviewed with patient. If no change will proceed with holter study and further adjustments.  - CBC w/Diff - Comp Met (CMET) - TSH  This visit occurred during the SARS-CoV-2 public health emergency.  Safety protocols were in place, including screening questions prior to the visit, additional usage of staff PPE, and extensive cleaning of exam room while observing appropriate contact time as indicated for disinfecting solutions.     Leeanne Rio, PA-C

## 2019-09-06 ENCOUNTER — Other Ambulatory Visit: Payer: Self-pay | Admitting: Physician Assistant

## 2019-09-06 DIAGNOSIS — I10 Essential (primary) hypertension: Secondary | ICD-10-CM

## 2019-09-12 ENCOUNTER — Other Ambulatory Visit: Payer: Self-pay

## 2019-09-12 ENCOUNTER — Encounter: Payer: Self-pay | Admitting: Physician Assistant

## 2019-09-12 ENCOUNTER — Ambulatory Visit (INDEPENDENT_AMBULATORY_CARE_PROVIDER_SITE_OTHER): Payer: PPO | Admitting: Physician Assistant

## 2019-09-12 VITALS — BP 124/72 | HR 80 | Temp 98.1°F | Ht 68.0 in | Wt 157.4 lb

## 2019-09-12 DIAGNOSIS — I10 Essential (primary) hypertension: Secondary | ICD-10-CM | POA: Diagnosis not present

## 2019-09-12 MED ORDER — IRBESARTAN 150 MG PO TABS
150.0000 mg | ORAL_TABLET | Freq: Every day | ORAL | 1 refills | Status: DC
Start: 2019-09-12 — End: 2020-03-12

## 2019-09-12 NOTE — Patient Instructions (Signed)
I am glad you are feeling better overall.  We will continue the Avapro at 150 mg daily for now. I have sent in a new script for you.   Continue low salt diet and good hydration. Check BP periodically at home and record.  Goal is from 110-140/60-85 If you note any recurrence of symptoms, please let me know.  Otherwise let's follow-up in 3 months.  If things remain stable we will go back to just twice a year follow-ups (1 follow-up and 1 physical).   Take care!

## 2019-09-12 NOTE — Progress Notes (Signed)
Patient presents to clinic today to follow-up regarding orthostatic lightheadedness and hypertension.  At last visit patient's Avapro was decreased from 300 mg once daily to 150 mg.  Patient was encouraged to keep well-hydrated and to keep a daily check of blood pressure.  Patient endorses taking the lower dose as directed and noting a substantial improvement.  Has noted his episodes of lightheadedness and dizziness have resolved.  Home blood pressures are remaining stable in the 563O to 756E systolic. Patient denies chest pain, palpitations, lightheadedness, dizziness, vision changes or frequent headaches.  Denies new symptoms or concerns today  Past Medical History:  Diagnosis Date  . Bladder calculus   . GERD (gastroesophageal reflux disease)   . History of kidney stones   . Hypertension   . Spinal stenosis   . Urgency of urination   . Wears glasses     Current Outpatient Medications on File Prior to Visit  Medication Sig Dispense Refill  . Bacillus Coagulans-Inulin (PROBIOTIC FORMULA PO) Take by mouth.    . Cholecalciferol (VITAMIN D3) 1000 UNITS CAPS Take 1 capsule by mouth daily.    . finasteride (PROSCAR) 5 MG tablet TAKE 1 TABLET BY MOUTH EVERY DAY 90 tablet 1  . irbesartan (AVAPRO) 300 MG tablet TAKE 1 TABLET BY MOUTH EVERY DAY IN THE MORNING (Patient taking differently: 150 mg. ) 90 tablet 1  . metoprolol succinate (TOPROL-XL) 25 MG 24 hr tablet TAKE 1 TABLET BY MOUTH EVERY EVENING 90 tablet 1  . Multiple Vitamin (MULTIVITAMIN WITH MINERALS) TABS tablet Take 1 tablet by mouth daily.    Marland Kitchen omeprazole (PRILOSEC) 40 MG capsule TAKE 1 CAPSULE (40 MG TOTAL) BY MOUTH DAILY AS NEEDED (HEART BURN). 90 capsule 1  . tamsulosin (FLOMAX) 0.4 MG CAPS capsule TAKE 1 CAPSULE BY MOUTH EVERY DAY 90 capsule 1   No current facility-administered medications on file prior to visit.    No Known Allergies  Family History  Problem Relation Age of Onset  . Breast cancer Mother   . Hypertension  Mother   . Lung cancer Father     Social History   Socioeconomic History  . Marital status: Married    Spouse name: Not on file  . Number of children: Not on file  . Years of education: 75  . Highest education level: Not on file  Occupational History  . Occupation: Retired   Tobacco Use  . Smoking status: Never Smoker  . Smokeless tobacco: Never Used  Vaping Use  . Vaping Use: Never used  Substance and Sexual Activity  . Alcohol use: Yes    Alcohol/week: 14.0 standard drinks    Types: 14 Glasses of wine per week    Comment: 2 wine daily  . Drug use: No  . Sexual activity: Yes  Other Topics Concern  . 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 Difficulty of Paying Living Expenses:   Food Insecurity:   . Worried About Charity fundraiser in the Last Year:   . Arboriculturist in the Last Year:   Transportation Needs:   . Film/video editor (Medical):   Marland Kitchen Lack of Transportation (Non-Medical):   Physical Activity:   . Days of Exercise per Week:   . Minutes of Exercise per Session:   Stress:   . Feeling of Stress :   Social Connections:   .  Frequency of Communication with Friends and Family:   . Frequency of Social Gatherings with Friends and Family:   . Attends Religious Services:   . Active Member of Clubs or Organizations:   . Attends Archivist Meetings:   Marland Kitchen Marital Status:    Review of Systems - See HPI.  All other ROS are negative.  BP 124/72   Pulse 80   Temp 98.1 F (36.7 C)   Ht _0  (1.727 m)   Wt 157 lb 6.1 oz (71.4 kg)   SpO2 98%   BMI 23.93 kg/m   Physical Exam Vitals reviewed.  Constitutional:      Appearance: Normal appearance.  HENT:     Head: Normocephalic and atraumatic.  Eyes:     Conjunctiva/sclera: Conjunctivae normal.  Cardiovascular:     Rate and Rhythm: Normal rate and regular rhythm.       Heart sounds: Normal heart sounds.  Pulmonary:     Effort: Pulmonary effort is normal.  Musculoskeletal:     Cervical back: Neck supple.  Neurological:     General: No focal deficit present.     Mental Status: He is alert. Mental status is at baseline.  Psychiatric:        Mood and Affect: Mood normal.     Recent Results (from the past 2160 hour(s))  Fecal occult blood, imunochemical     Status: None   Collection Time: 07/18/19 12:00 AM  Result Value Ref Range   IFOBT Negative   CBC w/Diff     Status: Abnormal   Collection Time: 09/05/19  9:18 AM  Result Value Ref Range   WBC 4.6 4.0 - 10.5 K/uL   RBC 4.19 (L) 4.22 - 5.81 Mil/uL   Hemoglobin 13.9 13.0 - 17.0 g/dL   HCT 40.1 39 - 52 %   MCV 95.6 78.0 - 100.0 fl   MCHC 34.7 30.0 - 36.0 g/dL   RDW 13.0 11.5 - 15.5 %   Platelets 259.0 150 - 400 K/uL   Neutrophils Relative % 73.1 43 - 77 %   Lymphocytes Relative 18.9 12 - 46 %   Monocytes Relative 6.6 3 - 12 %   Eosinophils Relative 1.1 0 - 5 %   Basophils Relative 0.3 0 - 3 %   Neutro Abs 3.4 1.4 - 7.7 K/uL   Lymphs Abs 0.9 0.7 - 4.0 K/uL   Monocytes Absolute 0.3 0 - 1 K/uL   Eosinophils Absolute 0.1 0 - 0 K/uL   Basophils Absolute 0.0 0 - 0 K/uL  Comp Met (CMET)     Status: Abnormal   Collection Time: 09/05/19  9:18 AM  Result Value Ref Range   Sodium 139 135 - 145 mEq/L   Potassium 4.5 3.5 - 5.1 mEq/L   Chloride 102 96 - 112 mEq/L   CO2 30 19 - 32 mEq/L   Glucose, Bld 123 (H) 70 - 99 mg/dL   BUN 20 6 - 23 mg/dL   Creatinine, Ser 1.00 0.40 - 1.50 mg/dL   Total Bilirubin 0.9 0.2 - 1.2 mg/dL   Alkaline Phosphatase 55 39 - 117 U/L   AST 19 0 - 37 U/L   ALT 15 0 - 53 U/L   Total Protein 7.3 6.0 - 8.3 g/dL   Albumin 4.6 3.5 - 5.2 g/dL   GFR 73.12 >60.00 mL/min   Calcium 9.6 8.4 - 10.5 mg/dL  TSH     Status: None   Collection Time: 09/05/19  9:18 AM  Result Value Ref Range   TSH 2.43 0.35 - 4.50 uIU/mL    Assessment/Plan: 1. Essential hypertension BP  normotensive.  Asymptomatic.  After decrease in the Avapro symptoms of lightheadedness and hypotensive BP readings have resolved.  We will continue to monitor.  Patient to check blood pressure at home periodically.  Follow-up for any recurrence of symptoms.  If doing well we will plan on follow-up in 3 months.  This visit occurred during the SARS-CoV-2 public health emergency.  Safety protocols were in place, including screening questions prior to the visit, additional usage of staff PPE, and extensive cleaning of exam room while observing appropriate contact time as indicated for disinfecting solutions.     Leeanne Rio, PA-C

## 2019-09-19 ENCOUNTER — Encounter: Payer: Self-pay | Admitting: Internal Medicine

## 2019-09-19 ENCOUNTER — Ambulatory Visit: Payer: PPO | Admitting: Internal Medicine

## 2019-09-19 VITALS — BP 160/82 | HR 80 | Ht 68.0 in | Wt 159.1 lb

## 2019-09-19 DIAGNOSIS — K222 Esophageal obstruction: Secondary | ICD-10-CM | POA: Diagnosis not present

## 2019-09-19 DIAGNOSIS — R1310 Dysphagia, unspecified: Secondary | ICD-10-CM | POA: Diagnosis not present

## 2019-09-19 DIAGNOSIS — Z1211 Encounter for screening for malignant neoplasm of colon: Secondary | ICD-10-CM

## 2019-09-19 DIAGNOSIS — K219 Gastro-esophageal reflux disease without esophagitis: Secondary | ICD-10-CM | POA: Diagnosis not present

## 2019-09-19 MED ORDER — OMEPRAZOLE 40 MG PO CPDR: 40.0000 mg | DELAYED_RELEASE_CAPSULE | Freq: Every day | ORAL | 3 refills | Status: DC | PRN

## 2019-09-19 NOTE — Progress Notes (Signed)
HISTORY OF PRESENT ILLNESS:  Anthony Collins is a 74 y.o. male who was evaluated July 06, 2019 regarding chronic GERD, new onset intermittent solid food dysphagia, and the need for colon cancer screening.  See that dictation for details.  He was advised to take his omeprazole 40 mg EVERY DAY.  He was set up for screening colonoscopy upper endoscopy.  These procedures were performed Jul 28, 2019.  Colonoscopy revealed a diminutive polyp without meaningful tissue available for pathologic submission.  In addition sigmoid diverticulosis.  No other abnormalities..  No routine follow-up recommended given his age.  Upper endoscopy revealed a distal esophageal stricture which was dilated with 54 French Maloney dilator.  He was asked to follow-up at this time.  Patient reports that he has been compliant with daily omeprazole therapy.  He denies any active symptoms.  His dysphagia has completely resolved.  He is pleased.  He did have several questions on probiotic use, fiber use, and need for future routine colonoscopies.  REVIEW OF SYSTEMS:  All non-GI ROS negative unless otherwise stated in HPI except for back pain   Past Medical History:  Diagnosis Date  . Bladder calculus   . GERD (gastroesophageal reflux disease)   . History of kidney stones   . Hypertension   . Spinal stenosis   . Urgency of urination   . Wears glasses     Past Surgical History:  Procedure Laterality Date  . BLEPHAROPLASTY    . EXTRACORPOREAL SHOCK WAVE LITHOTRIPSY  2004  . EYE SURGERY    . INGUINAL HERNIA REPAIR Left 1998  (spinal anesthesia)  . INGUINAL HERNIA REPAIR Right 05/04/2017   Procedure: OPEN RIGHT INGUINAL HERNIA REPAIR WITH MESH;  Surgeon: Judeth Horn, MD;  Location: [facility];  Service: General;  Laterality: Right;  . INSERTION OF MESH Right 05/04/2017   Procedure: INSERTION OF MESH;  Surgeon: Judeth Horn, MD;  Location: [facility];  Service: General;  Laterality: Right;  .  TONSILLECTOMY  age 64    Social History ZAYED GRIFFIE  reports that he has never smoked. He has never used smokeless tobacco. He reports current alcohol use of about 14.0 standard drinks of alcohol per week. He reports that he does not use drugs.  family history includes Breast cancer in his mother; Hypertension in his mother; Lung cancer in his father.  No Known Allergies     PHYSICAL EXAMINATION: Vital signs: BP (!) 160/82 (BP Location: Left Arm, Patient Position: Sitting, Cuff Size: Normal)   Pulse 80   Ht 5\' 8"  (1.727 m)   Wt 159 lb 2 oz (72.2 kg)   SpO2 97%   BMI 24.19 kg/m   Constitutional: generally well-appearing, no acute distress Psychiatric: alert and oriented x3, cooperative Eyes: Anicteric Mouth: Mask Abdomen.  Not reexamined Skin: no lesions on visible extremities Neuro: No focal deficits.   ASSESSMENT:  1.  GERD complicated by peptic stricture.  Asymptomatic post dilation on PPI 2.  Colon cancer screening.  Negative index examination 2010.  Diminutive polyp only 2021.  Aged out of future surveillance   PLAN:  1.  Reflux precautions 2.  Continue omeprazole 40 mg daily.  Prescription refilled.  Medication risks reviewed 3.  Routine GI office follow-up 1 year.  Contact the office in the interim for any questions or problems

## 2019-09-19 NOTE — Patient Instructions (Signed)
If you are age 74 or older, your body mass index should be between 23-30. Your Body mass index is 24.19 kg/m. If this is out of the aforementioned range listed, please consider follow up with your Primary Care Provider.  If you are age 81 or younger, your body mass index should be between 19-25. Your Body mass index is 24.19 kg/m. If this is out of the aformentioned range listed, please consider follow up with your Primary Care Provider.   Continue taking your Omeprazole 40 mg. Refills have been sent to your pharmacy.  Thank your for choosing Dr. Scarlette Shorts and [facility].  We will contact you when you are due for your Follow up.

## 2019-11-22 DIAGNOSIS — H5213 Myopia, bilateral: Secondary | ICD-10-CM | POA: Diagnosis not present

## 2019-11-22 DIAGNOSIS — Z961 Presence of intraocular lens: Secondary | ICD-10-CM | POA: Diagnosis not present

## 2019-11-23 ENCOUNTER — Ambulatory Visit: Payer: PPO | Admitting: Physician Assistant

## 2019-12-09 ENCOUNTER — Other Ambulatory Visit: Payer: Self-pay

## 2019-12-09 ENCOUNTER — Encounter: Payer: Self-pay | Admitting: Physician Assistant

## 2019-12-09 ENCOUNTER — Ambulatory Visit (INDEPENDENT_AMBULATORY_CARE_PROVIDER_SITE_OTHER): Payer: PPO | Admitting: Physician Assistant

## 2019-12-09 VITALS — BP 128/80 | HR 57 | Temp 98.2°F | Resp 16 | Ht 68.0 in | Wt 159.0 lb

## 2019-12-09 DIAGNOSIS — L739 Follicular disorder, unspecified: Secondary | ICD-10-CM | POA: Diagnosis not present

## 2019-12-09 DIAGNOSIS — I10 Essential (primary) hypertension: Secondary | ICD-10-CM | POA: Diagnosis not present

## 2019-12-09 LAB — COMPREHENSIVE METABOLIC PANEL
ALT: 19 U/L (ref 0–53)
AST: 22 U/L (ref 0–37)
Albumin: 4.5 g/dL (ref 3.5–5.2)
Alkaline Phosphatase: 48 U/L (ref 39–117)
BUN: 25 mg/dL — ABNORMAL HIGH (ref 6–23)
CO2: 29 mEq/L (ref 19–32)
Calcium: 9.3 mg/dL (ref 8.4–10.5)
Chloride: 104 mEq/L (ref 96–112)
Creatinine, Ser: 0.96 mg/dL (ref 0.40–1.50)
GFR: 76.59 mL/min (ref >60.00)
Glucose, Bld: 94 mg/dL (ref 70–99)
Potassium: 4.1 mEq/L (ref 3.5–5.1)
Sodium: 140 mEq/L (ref 135–145)
Total Bilirubin: 1.1 mg/dL (ref 0.2–1.2)
Total Protein: 7.2 g/dL (ref 6.0–8.3)

## 2019-12-09 MED ORDER — CLINDAMYCIN PHOSPHATE 1 % EX GEL: Freq: Two times a day (BID) | CUTANEOUS | 0 refills | Status: DC

## 2019-12-09 NOTE — Patient Instructions (Addendum)
Please go to the lab today for blood work.  I will call you with your results. We will alter treatment regimen(s) if indicated by your results.   I am glad your BP continues to look good.  Continue current medication regimen.   Please start the Clindamycin gel twice daily to the affected area for 10 days to resolve hte mild folliculitis.  For ongoing prevention, wash that area in hibiclens once weekly. Avoid wearing caps as they cause sweat to collect in the area and friction causes skin break down which can lead to folliculitis and cellulitis.

## 2019-12-09 NOTE — Progress Notes (Signed)
Patient presents to clinic today for 67-month recheck of Hypertension. Patient is currently on a regimen of Irbesartan 150 mg QD and Toprol XL 25 mg QD. Endorses taking as directed. Continues to tolerate well. Endorses eatring a well-balanced diet. Denies changes to diet.  Continues to walk 2 miles each day.  Patient denies chest pain, palpitations, lightheadedness, dizziness, vision changes or frequent headaches.  BP Readings from Last 3 Encounters:  12/09/19 128/80  09/19/19 (!) 160/82  09/12/19 124/72   Past Medical History:  Diagnosis Date  . Bladder calculus   . GERD (gastroesophageal reflux disease)   . History of kidney stones   . Hypertension   . Spinal stenosis   . Urgency of urination   . Wears glasses     Current Outpatient Medications on File Prior to Visit  Medication Sig Dispense Refill  . Bacillus Coagulans-Inulin (PROBIOTIC FORMULA PO) Take by mouth.    . Cholecalciferol (VITAMIN D3) 1000 UNITS CAPS Take 1 capsule by mouth daily.    . finasteride (PROSCAR) 5 MG tablet TAKE 1 TABLET BY MOUTH EVERY DAY 90 tablet 1  . irbesartan (AVAPRO) 150 MG tablet Take 1 tablet (150 mg total) by mouth daily. 90 tablet 1  . metoprolol succinate (TOPROL-XL) 25 MG 24 hr tablet TAKE 1 TABLET BY MOUTH EVERY EVENING 90 tablet 1  . Multiple Vitamin (MULTIVITAMIN WITH MINERALS) TABS tablet Take 1 tablet by mouth daily.    Marland Kitchen omeprazole (PRILOSEC) 40 MG capsule Take 1 capsule (40 mg total) by mouth daily as needed (heart burn). 90 capsule 3  . tamsulosin (FLOMAX) 0.4 MG CAPS capsule TAKE 1 CAPSULE BY MOUTH EVERY DAY 90 capsule 1   No current facility-administered medications on file prior to visit.    No Known Allergies  Family History  Problem Relation Age of Onset  . Breast cancer Mother   . Hypertension Mother   . Lung cancer Father   . Colon cancer Neg Hx   . Esophageal cancer Neg Hx   . Pancreatic cancer Neg Hx   . Stomach cancer Neg Hx     Social History   Socioeconomic  History  . Marital status: Married    Spouse name: Not on file  . Number of children: Not on file  . Years of education: 42  . Highest education level: Not on file  Occupational History  . Occupation: Retired   Tobacco Use  . Smoking status: Never Smoker  . Smokeless tobacco: Never Used  Vaping Use  . Vaping Use: Never used  Substance and Sexual Activity  . Alcohol use: Yes    Alcohol/week: 14.0 standard drinks    Types: 14 Glasses of wine per week    Comment: 2 wine daily  . Drug use: No  . Sexual activity: Yes  Other Topics Concern  . 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 Difficulty of Paying Living Expenses: Not on file  Food Insecurity:   . Worried About Charity fundraiser in the Last Year: Not on file  . Ran Out of Food in the Last Year: Not on file  Transportation Needs:   . Lack of Transportation (Medical): Not on file  . Lack of Transportation (Non-Medical): Not on file  Physical Activity:   . Days of Exercise per Week: Not on file  . Minutes of Exercise per Session:  Not on file  Stress:   . Feeling of Stress : Not on file  Social Connections:   . Frequency of Communication with Friends and Family: Not on file  . Frequency of Social Gatherings with Friends and Family: Not on file  . Attends Religious Services: Not on file  . Active Member of Clubs or Organizations: Not on file  . Attends Archivist Meetings: Not on file  . Marital Status: Not on file   Review of Systems - See HPI.  All other ROS are negative.  BP 128/80   Pulse (!) 57   Temp 98.2 F (36.8 C) (Temporal)   Resp 16   Ht 5\' 8"  (1.727 m)   Wt 159 lb (72.1 kg)   SpO2 99%   BMI 24.18 kg/m   Physical Exam Vitals reviewed.  Constitutional:      Appearance: Normal appearance.  HENT:     Head: Normocephalic and atraumatic.  Cardiovascular:      Rate and Rhythm: Normal rate and regular rhythm.     Pulses: Normal pulses.     Heart sounds: Normal heart sounds.  Pulmonary:     Effort: Pulmonary effort is normal.     Breath sounds: Normal breath sounds.  Musculoskeletal:     Cervical back: Neck supple.  Neurological:     General: No focal deficit present.     Mental Status: He is alert and oriented to person, place, and time.  Psychiatric:        Mood and Affect: Mood normal.    Assessment/Plan: 1. Essential hypertension BP normotensive.  Asymptomatic.  Continue current regimen.  Repeat CMP today. - Comprehensive metabolic panel  2. Folliculitis recurring issue for patient.  Current mild outbreak noted at the end of visit.  Examination reveals several small pustules of hairline in the occipital region.  Supportive measures and OTC medications reviewed.  Rx clindamycin gel to apply twice daily for 7 to 10 days.  Follow-up if not improving. - clindamycin (CLINDAGEL) 1 % gel; Apply topically 2 (two) times daily. For 7-10 days  Dispense: 30 g; Refill: 0  This visit occurred during the SARS-CoV-2 public health emergency.  Safety protocols were in place, including screening questions prior to the visit, additional usage of staff PPE, and extensive cleaning of exam room while observing appropriate contact time as indicated for disinfecting solutions.     Leeanne Rio, PA-C

## 2019-12-15 ENCOUNTER — Ambulatory Visit: Payer: PPO | Admitting: Physician Assistant

## 2019-12-21 ENCOUNTER — Telehealth: Payer: Self-pay

## 2019-12-21 DIAGNOSIS — L821 Other seborrheic keratosis: Secondary | ICD-10-CM | POA: Diagnosis not present

## 2019-12-21 DIAGNOSIS — D1801 Hemangioma of skin and subcutaneous tissue: Secondary | ICD-10-CM | POA: Diagnosis not present

## 2019-12-21 DIAGNOSIS — L570 Actinic keratosis: Secondary | ICD-10-CM | POA: Diagnosis not present

## 2019-12-21 DIAGNOSIS — L812 Freckles: Secondary | ICD-10-CM | POA: Diagnosis not present

## 2019-12-21 NOTE — Progress Notes (Signed)
Chronic Care Management Pharmacy Assistant   Name: Anthony Collins  MRN: [id number] DOB: 02-02-1946  Reason for Encounter: Disease State   PCP : Brunetta Jeans, PA-C  Allergies:  No Known Allergies  Medications: Outpatient Encounter Medications as of 12/21/2019  Medication Sig  . Bacillus Coagulans-Inulin (PROBIOTIC FORMULA PO) Take by mouth.  . Cholecalciferol (VITAMIN D3) 1000 UNITS CAPS Take 1 capsule by mouth daily.  . clindamycin (CLINDAGEL) 1 % gel Apply topically 2 (two) times daily. For 7-10 days  . finasteride (PROSCAR) 5 MG tablet TAKE 1 TABLET BY MOUTH EVERY DAY  . irbesartan (AVAPRO) 150 MG tablet Take 1 tablet (150 mg total) by mouth daily.  . metoprolol succinate (TOPROL-XL) 25 MG 24 hr tablet TAKE 1 TABLET BY MOUTH EVERY EVENING  . Multiple Vitamin (MULTIVITAMIN WITH MINERALS) TABS tablet Take 1 tablet by mouth daily.  Marland Kitchen omeprazole (PRILOSEC) 40 MG capsule Take 1 capsule (40 mg total) by mouth daily as needed (heart burn).  . tamsulosin (FLOMAX) 0.4 MG CAPS capsule TAKE 1 CAPSULE BY MOUTH EVERY DAY   No facility-administered encounter medications on file as of 12/21/2019.    Current Diagnosis: Patient Active Problem List   Diagnosis Date Noted  . Benign prostatic hyperplasia without lower urinary tract symptoms 05/24/2018  . Folliculitis 19/62/2297  . Prostate cancer screening 05/24/2018  . Direct inguinal hernia 01/12/2017  . Essential hypertension 06/09/2016  . Gastroesophageal reflux disease without esophagitis 06/09/2016  . Urinary frequency 06/09/2016  . Spinal stenosis of lumbar region 04/27/2015    Reviewed chart prior to disease state call. Spoke with patient regarding BP  Recent Office Vitals: BP Readings from Last 3 Encounters:  12/09/19 128/80  09/19/19 (!) 160/82  09/12/19 124/72   Pulse Readings from Last 3 Encounters:  12/09/19 (!) 57  09/19/19 80  09/12/19 80    Wt Readings from Last 3 Encounters:  12/09/19 159 lb (72.1 kg)    09/19/19 159 lb 2 oz (72.2 kg)  09/12/19 157 lb 6.1 oz (71.4 kg)     Kidney Function Lab Results  Component Value Date/Time   CREATININE 0.96 12/09/2019 09:35 AM   CREATININE 1.00 09/05/2019 09:18 AM   GFR 76.59 12/09/2019 09:35 AM   GFRNONAA >60 04/29/2017 12:45 PM   GFRAA >60 04/29/2017 12:45 PM    BMP Latest Ref Rng & Units 12/09/2019 09/05/2019 05/23/2019  Glucose 70 - 99 mg/dL 94 123(H) 95  BUN 6 - 23 mg/dL 25(H) 20 19  Creatinine 0.40 - 1.50 mg/dL 0.96 1.00 0.98  Sodium 135 - 145 mEq/L 140 139 140  Potassium 3.5 - 5.1 mEq/L 4.1 4.5 4.8  Chloride 96 - 112 mEq/L 104 102 103  CO2 19 - 32 mEq/L 29 30 29   Calcium 8.4 - 10.5 mg/dL 9.3 9.6 9.5    . Current antihypertensive regimen:  o Irbesartan 150mg  Tab Take 1 tab by mouth daily , o  Metoprolol Succinate  25mg  Take 1 tab by mouth every evening                       . How often are you checking your Blood Pressure? daily . Current home BP readings: 130s over 80s . What recent interventions/DTPs have been made by any provider to improve Blood Pressure control since last CPP Visit: None noted . Any recent hospitalizations or ED visits since last visit with CPP? No . What diet changes have been made to improve Blood Pressure Control?  o Patient states he has been watching what he eats . What exercise is being done to improve your Blood Pressure Control?  o Patient states he walks 2 miles every day  Adherence Review: Is the patient currently on ACE/ARB medication? Yes Does the patient have >5 day gap between last estimated fill dates? No   [place] with patient and rescheduled appointment from 12-29-19 to 01-05-20 at 9:30am.   Georgiana Shore ,[place] Pharmacist Assistant [phone]     Follow-Up:  Pharmacist Review

## 2019-12-26 ENCOUNTER — Telehealth: Payer: PPO

## 2019-12-29 ENCOUNTER — Telehealth: Payer: PPO

## 2020-01-05 ENCOUNTER — Other Ambulatory Visit: Payer: Self-pay

## 2020-01-05 ENCOUNTER — Ambulatory Visit: Payer: PPO

## 2020-01-05 DIAGNOSIS — I10 Essential (primary) hypertension: Secondary | ICD-10-CM

## 2020-01-05 DIAGNOSIS — K219 Gastro-esophageal reflux disease without esophagitis: Secondary | ICD-10-CM

## 2020-01-05 NOTE — Patient Instructions (Signed)
Please review care plan below and call me at [phone] with any questions!  Thank you, Edyth Gunnels., Clinical Pharmacist  Goals Addressed            This Visit's Progress    PharmD Care Plan   On track    CARE PLAN ENTRY  Current Barriers:   Chronic Disease Management support, education, and care coordination needs related to HTN and GERD  Pharmacist Clinical Goal(s):   BP <130/90 , minimize acid reflux symptoms.   Interventions:  Comprehensive medication review performed.  Patient Self Care Activities:   Patient verbalizes understanding of plan to check blood pressure readings 1-3 times/day for 2 weeks. Please call with any questions about your medications.  Initial goal documentation       The patient verbalized understanding of instructions provided today and agreed to receive a mailed copy of patient instruction and/or educational materials. Telephone follow up appointment with pharmacy team member scheduled for: See next appointment with "Care Management Staff" under "What's Next" below.   Madelin Rear, Pharm.D., BCGP Clinical Pharmacist [facility] [phone]  Hypertension, Adult High blood pressure (hypertension) is when the force of blood pumping through the arteries is too strong. The arteries are the blood vessels that carry blood from the heart throughout the body. Hypertension forces the heart to work harder to pump blood and may cause arteries to become narrow or stiff. Untreated or uncontrolled hypertension can cause a heart attack, heart failure, a stroke, kidney disease, and other problems. A blood pressure reading consists of a higher number over a lower number. Ideally, your blood pressure should be below 120/80. The first ("top") number is called the systolic pressure. It is a measure of the pressure in your arteries as your heart beats. The second ("bottom") number is called the diastolic pressure. It is a measure of the pressure in your  arteries as the heart relaxes. What are the causes? The exact cause of this condition is not known. There are some conditions that result in or are related to high blood pressure. What increases the risk? Some risk factors for high blood pressure are under your control. The following factors may make you more likely to develop this condition:  Smoking.  Having type 2 diabetes mellitus, high cholesterol, or both.  Not getting enough exercise or physical activity.  Being overweight.  Having too much fat, sugar, calories, or salt (sodium) in your diet.  Drinking too much alcohol. Some risk factors for high blood pressure may be difficult or impossible to change. Some of these factors include:  Having chronic kidney disease.  Having a family history of high blood pressure.  Age. Risk increases with age.  Race. You may be at higher risk if you are African American.  Gender. Men are at higher risk than women before age 31. After age 42, women are at higher risk than men.  Having obstructive sleep apnea.  Stress. What are the signs or symptoms? High blood pressure may not cause symptoms. Very high blood pressure (hypertensive crisis) may cause:  Headache.  Anxiety.  Shortness of breath.  Nosebleed.  Nausea and vomiting.  Vision changes.  Severe chest pain.  Seizures. How is this diagnosed? This condition is diagnosed by measuring your blood pressure while you are seated, with your arm resting on a flat surface, your legs uncrossed, and your feet flat on the floor. The cuff of the blood pressure monitor will be placed directly against the skin of your upper  arm at the level of your heart. It should be measured at least twice using the same arm. Certain conditions can cause a difference in blood pressure between your right and left arms. Certain factors can cause blood pressure readings to be lower or higher than normal for a short period of time:  When your blood  pressure is higher when you are in a health care provider's office than when you are at home, this is called white coat hypertension. Most people with this condition do not need medicines.  When your blood pressure is higher at home than when you are in a health care provider's office, this is called masked hypertension. Most people with this condition may need medicines to control blood pressure. If you have a high blood pressure reading during one visit or you have normal blood pressure with other risk factors, you may be asked to:  Return on a different day to have your blood pressure checked again.  Monitor your blood pressure at home for 1 week or longer. If you are diagnosed with hypertension, you may have other blood or imaging tests to help your health care provider understand your overall risk for other conditions. How is this treated? This condition is treated by making healthy lifestyle changes, such as eating healthy foods, exercising more, and reducing your alcohol intake. Your health care provider may prescribe medicine if lifestyle changes are not enough to get your blood pressure under control, and if:  Your systolic blood pressure is above 130.  Your diastolic blood pressure is above 80. Your personal target blood pressure may vary depending on your medical conditions, your age, and other factors. Follow these instructions at home: Eating and drinking   Eat a diet that is high in fiber and potassium, and low in sodium, added sugar, and fat. An example eating plan is called the DASH (Dietary Approaches to Stop Hypertension) diet. To eat this way: ? Eat plenty of fresh fruits and vegetables. Try to fill one half of your plate at each meal with fruits and vegetables. ? Eat whole grains, such as whole-wheat pasta, brown rice, or whole-grain bread. Fill about one fourth of your plate with whole grains. ? Eat or drink low-fat dairy products, such as skim milk or low-fat  yogurt. ? Avoid fatty cuts of meat, processed or cured meats, and poultry with skin. Fill about one fourth of your plate with lean proteins, such as fish, chicken without skin, beans, eggs, or tofu. ? Avoid pre-made and processed foods. These tend to be higher in sodium, added sugar, and fat.  Reduce your daily sodium intake. Most people with hypertension should eat less than 1,500 mg of sodium a day.  Do not drink alcohol if: ? Your health care provider tells you not to drink. ? You are pregnant, may be pregnant, or are planning to become pregnant.  If you drink alcohol: ? Limit how much you use to:  0-1 drink a day for women.  0-2 drinks a day for men. ? Be aware of how much alcohol is in your drink. In the [place] one drink equals one 12 oz bottle of beer (355 mL), one 5 oz glass of wine (148 mL), or one 1 oz glass of hard liquor (44 mL). Lifestyle   Work with your health care provider to maintain a healthy body weight or to lose weight. Ask what an ideal weight is for you.  Get at least 30 minutes of exercise most days of the  week. Activities may include walking, swimming, or biking.  Include exercise to strengthen your muscles (resistance exercise), such as Pilates or lifting weights, as part of your weekly exercise routine. Try to do these types of exercises for 30 minutes at least 3 days a week.  Do not use any products that contain nicotine or tobacco, such as cigarettes, e-cigarettes, and chewing tobacco. If you need help quitting, ask your health care provider.  Monitor your blood pressure at home as told by your health care provider.  Keep all follow-up visits as told by your health care provider. This is important. Medicines  Take over-the-counter and prescription medicines only as told by your health care provider. Follow directions carefully. Blood pressure medicines must be taken as prescribed.  Do not skip doses of blood pressure medicine. Doing this puts you at risk  for problems and can make the medicine less effective.  Ask your health care provider about side effects or reactions to medicines that you should watch for. Contact a health care provider if you:  Think you are having a reaction to a medicine you are taking.  Have headaches that keep coming back (recurring).  Feel dizzy.  Have swelling in your ankles.  Have trouble with your vision. Get help right away if you:  Develop a severe headache or confusion.  Have unusual weakness or numbness.  Feel faint.  Have severe pain in your chest or abdomen.  Vomit repeatedly.  Have trouble breathing. Summary  Hypertension is when the force of blood pumping through your arteries is too strong. If this condition is not controlled, it may put you at risk for serious complications.  Your personal target blood pressure may vary depending on your medical conditions, your age, and other factors. For most people, a normal blood pressure is less than 120/80.  Hypertension is treated with lifestyle changes, medicines, or a combination of both. Lifestyle changes include losing weight, eating a healthy, low-sodium diet, exercising more, and limiting alcohol. This information is not intended to replace advice given to you by your health care provider. Make sure you discuss any questions you have with your health care provider. Document Revised: 11/18/2017 Document Reviewed: 11/18/2017 Elsevier Patient Education  2020 [employer].

## 2020-01-05 NOTE — Progress Notes (Signed)
Chronic Care Management Pharmacy  Name: Anthony Collins  MRN: [id number] DOB: 1946/03/01  Chief Complaint/ HPI  Anthony Collins,  74 y.o., male presents for their Follow-Up CCM visit with the clinical pharmacist via telephone due to COVID-19 Pandemic.  PCP : Brunetta Jeans, PA-C  Chronic conditions include:  Encounter Diagnoses  Name Primary?   Essential hypertension Yes   Gastroesophageal reflux disease without esophagitis    Patient Active Problem List   Diagnosis Date Noted   Benign prostatic hyperplasia without lower urinary tract symptoms 27/25/3664   Folliculitis 40/34/7425   Prostate cancer screening 05/24/2018   Direct inguinal hernia 01/12/2017   Essential hypertension 06/09/2016   Gastroesophageal reflux disease without esophagitis 06/09/2016   Urinary frequency 06/09/2016   Spinal stenosis of lumbar region 04/27/2015   Past Surgical History:  Procedure Laterality Date   BLEPHAROPLASTY     EXTRACORPOREAL SHOCK WAVE LITHOTRIPSY  2004   EYE SURGERY     INGUINAL HERNIA REPAIR Left 1998  (spinal anesthesia)   INGUINAL HERNIA REPAIR Right 05/04/2017   Procedure: OPEN RIGHT INGUINAL HERNIA REPAIR WITH MESH;  Surgeon: Judeth Horn, MD;  Location: [facility];  Service: General;  Laterality: Right;   INSERTION OF MESH Right 05/04/2017   Procedure: INSERTION OF MESH;  Surgeon: Judeth Horn, MD;  Location: [facility];  Service: General;  Laterality: Right;   TONSILLECTOMY  age 69   Family History  Problem Relation Age of Onset   Breast cancer Mother    Hypertension Mother    Lung cancer Father    Colon cancer Neg Hx    Esophageal cancer Neg Hx    Pancreatic cancer Neg Hx    Stomach cancer Neg Hx    No Known Allergies Outpatient Encounter Medications as of 01/05/2020  Medication Sig   Bacillus Coagulans-Inulin (PROBIOTIC FORMULA PO) Take by mouth.   Cholecalciferol (VITAMIN D3) 1000 UNITS CAPS Take 1  capsule by mouth daily.   clindamycin (CLINDAGEL) 1 % gel Apply topically 2 (two) times daily. For 7-10 days   cyanocobalamin 1000 MCG tablet Take 1,000 mcg by mouth daily.   finasteride (PROSCAR) 5 MG tablet TAKE 1 TABLET BY MOUTH EVERY DAY   irbesartan (AVAPRO) 150 MG tablet Take 1 tablet (150 mg total) by mouth daily.   Lutein-Zeaxanthin 20-1 MG CAPS Take 1 capsule by mouth daily.   metoprolol succinate (TOPROL-XL) 25 MG 24 hr tablet TAKE 1 TABLET BY MOUTH EVERY EVENING   Multiple Vitamin (MULTIVITAMIN WITH MINERALS) TABS tablet Take 1 tablet by mouth daily.   omeprazole (PRILOSEC) 40 MG capsule Take 1 capsule (40 mg total) by mouth daily as needed (heart burn).   tamsulosin (FLOMAX) 0.4 MG CAPS capsule TAKE 1 CAPSULE BY MOUTH EVERY DAY   No facility-administered encounter medications on file as of 01/05/2020.   Patient Care Team    Relationship Specialty Notifications Start End  Brunetta Jeans, [place] PCP - General Family Medicine  01/13/17   Nickie Retort, MD Consulting Physician Urology  11/26/17   Jarome Matin, MD Consulting Physician Dermatology  11/26/17   Luberta Mutter, MD Consulting Physician Ophthalmology  11/26/17   Royston Cowper, DDS  Dentistry  11/26/17   Lorenda Cahill, DMD  Dentistry  11/26/17   Marybelle Killings, MD Consulting Physician Orthopedic Surgery  11/26/17   Madelin Rear, [facility] Pharmacist Pharmacist  06/27/19    Comment: phone number [phone]   Current Diagnosis/Assessment: Goals Addressed  This Visit's Progress    PharmD Care Plan   On track    CARE PLAN ENTRY  Current Barriers:   Chronic Disease Management support, education, and care coordination needs related to HTN and GERD  Pharmacist Clinical Goal(s):   BP <130/90 , minimize acid reflux symptoms.   Interventions:  Comprehensive medication review performed.  Patient Self Care Activities:   Patient verbalizes understanding of plan to check blood pressure readings 1-3  times/day for 2 weeks. Please call with any questions about your medications.  Initial goal documentation       Hypertension   BP goal <130/80  BP Readings from Last 3 Encounters:  12/09/19 128/80  09/19/19 (!) 160/82  09/12/19 124/72   BP 128/78 today at home. Irbesartan previously reduced from 300 to 150 mg once daily.  Patient is currently at goal on the following medications:   Irbesartan 150 mg once daily   Metoprolol succinate 25 mg XL once daily   We discussed diet and exercise extensively. Stays active with daily walks, weight lifting. Avoiding foods high in fat along with foods that may aggravate acid reflux.   Plan  Continue current medications and control with diet and exercise.   GERD   Patient denies recent acid reflux. Currently controlled on:  Omeprazole 40 mg   We discussed: Avoidance of potential triggers such as carbonated beverages, chocolate and tomato sauce.  Plan   Continue current medications.  Vaccines   Immunization History  Administered Date(s) Administered   Fluad Quad(high Dose 65+) 01/25/2019   Influenza, High Dose Seasonal PF 01/12/2017, 01/25/2018   PFIZER SARS-COV-2 Vaccination 04/09/2019, 04/30/2019   Pneumococcal Conjugate-13 04/28/2014, 11/26/2017   Pneumococcal Polysaccharide-23 01/16/2012   Tdap 06/06/2011   Zoster Recombinat (Shingrix) 01/12/2017, 06/10/2017   Got covid booster last week, has appt scheduled to receive annual flu 01/19/2020.  Reviewed and discussed patient's vaccination history.    Plan  No recommendations at this time.   Medication Management / Care Coordination   Receives prescription medications from:  [id number] - [place], [address] [place] AT [address] [place] [address] Phone: [phone] Fax: [phone]   Has medications synchronized at local pharmacy, no issues with medication management at this time.    Plan  Continue current medication management strategy. ___________________________ SDOH (Social Determinants of Health) assessments performed: Yes.  Future Appointments  Date Time Provider [place]  01/19/2020  9:30 AM SV-NURSE [facility]  06/11/2020  8:30 AM Brunetta Jeans, PA-C [facility]  07/05/2020  9:00 AM [facility] PHARMACIST [facility]   Visit follow-up:   CPA follow-up: BP call 3 months.  [facility] follow-up: 6 month f/u telephone visit.  Madelin Rear, Pharm.D., BCGP Clinical Pharmacist [facility] [phone]

## 2020-01-07 ENCOUNTER — Other Ambulatory Visit: Payer: Self-pay | Admitting: Physician Assistant

## 2020-01-07 DIAGNOSIS — I10 Essential (primary) hypertension: Secondary | ICD-10-CM

## 2020-01-19 ENCOUNTER — Ambulatory Visit: Payer: PPO

## 2020-01-23 ENCOUNTER — Ambulatory Visit (INDEPENDENT_AMBULATORY_CARE_PROVIDER_SITE_OTHER): Payer: PPO | Admitting: Emergency Medicine

## 2020-01-23 ENCOUNTER — Other Ambulatory Visit: Payer: Self-pay

## 2020-01-23 DIAGNOSIS — Z23 Encounter for immunization: Secondary | ICD-10-CM

## 2020-02-22 ENCOUNTER — Other Ambulatory Visit: Payer: Self-pay | Admitting: Physician Assistant

## 2020-02-22 DIAGNOSIS — R350 Frequency of micturition: Secondary | ICD-10-CM

## 2020-02-29 ENCOUNTER — Other Ambulatory Visit: Payer: Self-pay | Admitting: Physician Assistant

## 2020-02-29 DIAGNOSIS — I10 Essential (primary) hypertension: Secondary | ICD-10-CM

## 2020-03-11 ENCOUNTER — Other Ambulatory Visit: Payer: Self-pay | Admitting: Physician Assistant

## 2020-04-05 ENCOUNTER — Other Ambulatory Visit: Payer: Self-pay

## 2020-04-05 ENCOUNTER — Encounter: Payer: Self-pay | Admitting: Physician Assistant

## 2020-04-05 ENCOUNTER — Ambulatory Visit (INDEPENDENT_AMBULATORY_CARE_PROVIDER_SITE_OTHER): Payer: PPO | Admitting: Physician Assistant

## 2020-04-05 VITALS — BP 138/82 | HR 65 | Temp 98.4°F | Resp 18 | Ht 68.0 in | Wt 162.0 lb

## 2020-04-05 DIAGNOSIS — I10 Essential (primary) hypertension: Secondary | ICD-10-CM | POA: Diagnosis not present

## 2020-04-05 DIAGNOSIS — K4091 Unilateral inguinal hernia, without obstruction or gangrene, recurrent: Secondary | ICD-10-CM

## 2020-04-05 NOTE — Patient Instructions (Signed)
Please continue chronic medications.  Follow the dietary recommendations below. Keep hydrated. Check BP every other day over the next 2 weeks after you have been sitting down for 5 minutes or so.  Record these numbers and call me in 2 weeks to review these. If you note consistently > 145/90, please let me know sooner.   You will be contacted by general surgery for evaluation of recurrent hernia. Ok to continue use of the hernia belt. Avoid heavy lifting and overexertion.    [email or web address].[email or web address]">  DASH Eating Plan DASH stands for Dietary Approaches to Stop Hypertension. The DASH eating plan is a healthy eating plan that has been shown to:  Reduce high blood pressure (hypertension).  Reduce your risk for type 2 diabetes, heart disease, and stroke.  Help with weight loss. What are tips for following this plan? Reading food labels  Check food labels for the amount of salt (sodium) per serving. Choose foods with less than 5 percent of the Daily Value of sodium. Generally, foods with less than 300 milligrams (mg) of sodium per serving fit into this eating plan.  To find whole grains, look for the word "whole" as the first word in the ingredient list. Shopping  Buy products labeled as "low-sodium" or "no salt added."  Buy fresh foods. Avoid canned foods and pre-made or frozen meals. Cooking  Avoid adding salt when cooking. Use salt-free seasonings or herbs instead of table salt or sea salt. Check with your health care provider or pharmacist before using salt substitutes.  Do not fry foods. Cook foods using healthy methods such as baking, boiling, grilling, roasting, and broiling instead.  Cook with heart-healthy oils, such as olive, canola, avocado, soybean, or sunflower oil. Meal planning  Eat a balanced diet that includes: ? 4 or more servings of fruits and 4 or more servings of vegetables each day. Try to fill one-half of your plate  with fruits and vegetables. ? 6-8 servings of whole grains each day. ? Less than 6 oz (170 g) of lean meat, poultry, or fish each day. A 3-oz (85-g) serving of meat is about the same size as a deck of cards. One egg equals 1 oz (28 g). ? 2-3 servings of low-fat dairy each day. One serving is 1 cup (237 mL). ? 1 serving of nuts, seeds, or beans 5 times each week. ? 2-3 servings of heart-healthy fats. Healthy fats called omega-3 fatty acids are found in foods such as walnuts, flaxseeds, fortified milks, and eggs. These fats are also found in cold-water fish, such as sardines, salmon, and mackerel.  Limit how much you eat of: ? Canned or prepackaged foods. ? Food that is high in trans fat, such as some fried foods. ? Food that is high in saturated fat, such as fatty meat. ? Desserts and other sweets, sugary drinks, and other foods with added sugar. ? Full-fat dairy products.  Do not salt foods before eating.  Do not eat more than 4 egg yolks a week.  Try to eat at least 2 vegetarian meals a week.  Eat more home-cooked food and less restaurant, buffet, and fast food.   Lifestyle  When eating at a restaurant, ask that your food be prepared with less salt or no salt, if possible.  If you drink alcohol: ? Limit how much you use to:  0-1 drink a day for women who are not pregnant.  0-2 drinks a day for men. ? Be aware of how much alcohol is in your  drink. In the [place] one drink equals one 12 oz bottle of beer (355 mL), one 5 oz glass of wine (148 mL), or one 1 oz glass of hard liquor (44 mL). General information  Avoid eating more than 2,300 mg of salt a day. If you have hypertension, you may need to reduce your sodium intake to 1,500 mg a day.  Work with your health care provider to maintain a healthy body weight or to lose weight. Ask what an ideal weight is for you.  Get at least 30 minutes of exercise that causes your heart to beat faster (aerobic exercise) most days of the week.  Activities may include walking, swimming, or biking.  Work with your health care provider or dietitian to adjust your eating plan to your individual calorie needs. What foods should I eat? Fruits All fresh, dried, or frozen fruit. Canned fruit in natural juice (without added sugar). Vegetables Fresh or frozen vegetables (raw, steamed, roasted, or grilled). Low-sodium or reduced-sodium tomato and vegetable juice. Low-sodium or reduced-sodium tomato sauce and tomato paste. Low-sodium or reduced-sodium canned vegetables. Grains Whole-grain or whole-wheat bread. Whole-grain or whole-wheat pasta. Brown rice. [place]. Bulgur. Whole-grain and low-sodium cereals. Pita bread. Low-fat, low-sodium crackers. Whole-wheat flour tortillas. Meats and other proteins Skinless chicken or [place]. Ground chicken or [place]. Pork with fat trimmed off. Fish and seafood. Egg whites. Dried beans, peas, or lentils. Unsalted nuts, nut butters, and seeds. Unsalted canned beans. Lean cuts of beef with fat trimmed off. Low-sodium, lean precooked or cured meat, such as sausages or meat loaves. Dairy Low-fat (1%) or fat-free (skim) milk. Reduced-fat, low-fat, or fat-free cheeses. Nonfat, low-sodium ricotta or cottage cheese. Low-fat or nonfat yogurt. Low-fat, low-sodium cheese. Fats and oils Soft margarine without trans fats. Vegetable oil. Reduced-fat, low-fat, or light mayonnaise and salad dressings (reduced-sodium). Canola, safflower, olive, avocado, soybean, and sunflower oils. Avocado. Seasonings and condiments Herbs. Spices. Seasoning mixes without salt. Other foods Unsalted popcorn and pretzels. Fat-free sweets. The items listed above may not be a complete list of foods and beverages you can eat. Contact a dietitian for more information. What foods should I avoid? Fruits Canned fruit in a light or heavy syrup. Fried fruit. Fruit in cream or butter sauce. Vegetables Creamed or fried vegetables. Vegetables in a  cheese sauce. Regular canned vegetables (not low-sodium or reduced-sodium). Regular canned tomato sauce and paste (not low-sodium or reduced-sodium). Regular tomato and vegetable juice (not low-sodium or reduced-sodium). Angie Fava. Olives. Grains Baked goods made with fat, such as croissants, muffins, or some breads. Dry pasta or rice meal packs. Meats and other proteins Fatty cuts of meat. Ribs. Fried meat. Berniece Salines. Bologna, salami, and other precooked or cured meats, such as sausages or meat loaves. Fat from the back of a pig (fatback). Bratwurst. Salted nuts and seeds. Canned beans with added salt. Canned or smoked fish. Whole eggs or egg yolks. Chicken or [place] with skin. Dairy Whole or 2% milk, cream, and half-and-half. Whole or full-fat cream cheese. Whole-fat or sweetened yogurt. Full-fat cheese. Nondairy creamers. Whipped toppings. Processed cheese and cheese spreads. Fats and oils Butter. Stick margarine. Lard. Shortening. Ghee. Bacon fat. Tropical oils, such as coconut, palm kernel, or palm oil. Seasonings and condiments Onion salt, garlic salt, seasoned salt, table salt, and sea salt. Worcestershire sauce. Tartar sauce. Barbecue sauce. Teriyaki sauce. Soy sauce, including reduced-sodium. Steak sauce. Canned and packaged gravies. Fish sauce. Oyster sauce. Cocktail sauce. Store-bought horseradish. Ketchup. Mustard. Meat flavorings and tenderizers. Bouillon cubes. Hot sauces. Pre-made  or packaged marinades. Pre-made or packaged taco seasonings. Relishes. Regular salad dressings. Other foods Salted popcorn and pretzels. The items listed above may not be a complete list of foods and beverages you should avoid. Contact a dietitian for more information. Where to find more information  National Heart, Lung, and Blood Institute: https://wilson-eaton.com/  American Heart Association: www.heart.org  Academy of Nutrition and Dietetics: www.eatright.[place]:  www.kidney.org Summary  The DASH eating plan is a healthy eating plan that has been shown to reduce high blood pressure (hypertension). It may also reduce your risk for type 2 diabetes, heart disease, and stroke.  When on the DASH eating plan, aim to eat more fresh fruits and vegetables, whole grains, lean proteins, low-fat dairy, and heart-healthy fats.  With the DASH eating plan, you should limit salt (sodium) intake to 2,300 mg a day. If you have hypertension, you may need to reduce your sodium intake to 1,500 mg a day.  Work with your health care provider or dietitian to adjust your eating plan to your individual calorie needs. This information is not intended to replace advice given to you by your health care provider. Make sure you discuss any questions you have with your health care provider. Document Revised: 02/11/2019 Document Reviewed: 02/11/2019 Elsevier Patient Education  2021 [employer].

## 2020-04-05 NOTE — Progress Notes (Signed)
Patient presents to clinic today to discuss multiple issues.  Patient with history of right inguinal hernia status postrepair in March 2018.  Over the past year has noted a recurrence of bulge in the area.  States his wife has had him wear a hernia belt daily.  States that he wears the belt he is relatively asymptomatic but if he stands for more than 10 minutes without it on he starts to have moderate pain in the area rated about a 4-5 out of 10.  Denies any constipation or bowel changes.  Denies any redness or hardness of palpable herniation.  He is wondering if he should follow-up with general surgery.  Patient also with a history of hypertension, currently on a regimen of Avapro and Toprol-XL which she endorses taking daily.  Previously had a decreased dose of Avapro due to hypotensive episodes.  Has been doing extremely well for the past year but notes over the holidays gaining a few pounds.  Has noted now his blood pressure will sometimes go up to 606 systolic. Patient denies chest pain, palpitations, lightheadedness, dizziness, vision changes or frequent headaches.  BP Readings from Last 3 Encounters:  04/05/20 138/82  12/09/19 128/80  09/19/19 (!) 160/82   Past Medical History:  Diagnosis Date  . Bladder calculus   . GERD (gastroesophageal reflux disease)   . History of kidney stones   . Hypertension   . Spinal stenosis   . Urgency of urination   . Wears glasses     Current Outpatient Medications on File Prior to Visit  Medication Sig Dispense Refill  . Bacillus Coagulans-Inulin (PROBIOTIC FORMULA PO) Take by mouth.    . Cholecalciferol (VITAMIN D3) 1000 UNITS CAPS Take 1 capsule by mouth daily.    . cyanocobalamin 1000 MCG tablet Take 1,000 mcg by mouth daily.    . finasteride (PROSCAR) 5 MG tablet TAKE 1 TABLET BY MOUTH EVERY DAY 90 tablet 1  . irbesartan (AVAPRO) 150 MG tablet TAKE 1 TABLET BY MOUTH EVERY DAY 90 tablet 0  . Lutein-Zeaxanthin 20-1 MG CAPS Take 1 capsule by  mouth daily.    . metoprolol succinate (TOPROL-XL) 25 MG 24 hr tablet TAKE 1 TABLET BY MOUTH EVERY EVENING 90 tablet 1  . Multiple Vitamin (MULTIVITAMIN WITH MINERALS) TABS tablet Take 1 tablet by mouth daily.    Marland Kitchen omeprazole (PRILOSEC) 40 MG capsule Take 1 capsule (40 mg total) by mouth daily as needed (heart burn). 90 capsule 3  . tamsulosin (FLOMAX) 0.4 MG CAPS capsule TAKE 1 CAPSULE BY MOUTH EVERY DAY 90 capsule 1   No current facility-administered medications on file prior to visit.    No Known Allergies  Family History  Problem Relation Age of Onset  . Breast cancer Mother   . Hypertension Mother   . Lung cancer Father   . Colon cancer Neg Hx   . Esophageal cancer Neg Hx   . Pancreatic cancer Neg Hx   . Stomach cancer Neg Hx     Social History   Socioeconomic History  . Marital status: Married    Spouse name: Not on file  . Number of children: Not on file  . Years of education: 61  . Highest education level: Not on file  Occupational History  . Occupation: Retired   Tobacco Use  . Smoking status: Never Smoker  . Smokeless tobacco: Never Used  Vaping Use  . Vaping Use: Never used  Substance and Sexual Activity  . Alcohol use: Yes  Alcohol/week: 14.0 standard drinks    Types: 14 Glasses of wine per week    Comment: 2 wine daily  . Drug use: No  . Sexual activity: Yes  Other Topics Concern  . 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 Worried About Charity fundraiser in the Last Year: Never true  . Ran Out of Food in the Last Year: Never true  Transportation Needs: No Transportation Needs  . Lack of Transportation (Medical): No  . Lack of Transportation (Non-Medical): No  Physical Activity: Not on file  Stress: Not on file  Social Connections: Not on file   Review of Systems -  See HPI.  All other ROS are negative.  BP 138/82   Pulse 65   Temp 98.4 F (36.9 C) (Temporal)   Resp 18   Ht 5\' 8"  (1.727 m)   Wt 162 lb (73.5 kg)   SpO2 98%   BMI 24.63 kg/m   Physical Exam Vitals reviewed.  Constitutional:      Appearance: Normal appearance.  HENT:     Head: Normocephalic and atraumatic.  Cardiovascular:     Rate and Rhythm: Normal rate and regular rhythm.     Pulses: Normal pulses.     Heart sounds: Normal heart sounds.  Pulmonary:     Effort: Pulmonary effort is normal.     Breath sounds: Normal breath sounds.  Abdominal:     General: Bowel sounds are normal. There is no distension.     Palpations: Abdomen is soft. There is no mass.     Tenderness: There is abdominal tenderness. There is no guarding.     Hernia: A hernia (Right inguinal.  Reducible.) is present.  Musculoskeletal:     Cervical back: Neck supple.  Neurological:     General: No focal deficit present.     Mental Status: He is alert.  Psychiatric:        Mood and Affect: Mood normal.    Assessment/Plan: 1. Unilateral recurrent inguinal hernia without obstruction or gangrene Reducible.  Without evidence of incarceration or strangulation.  Mildly tender to palpation.  He is symptomatic when he is not wearing his hernia belt.  Plan for referral back to general surgery given recurrence of hernia and symptoms.  Strict ER precautions discussed with patient who voiced understanding and agreement with the plan. - Ambulatory referral to General Surgery  2. Essential hypertension BP 138/82 today.  Acceptable for his age.  We will have him continue his current medication regimen but get back on track with DASH diet and exercise regimen.  He is to check BP at home every other day for the next 2 weeks and record.  We will follow-up via phone at that time to reassess blood pressures.  If continuing to be elevated will adjust dose of medications.  This visit occurred during the SARS-CoV-2 public  health emergency.  Safety protocols were in place, including screening questions prior to the visit, additional usage of staff PPE, and extensive cleaning of exam room while observing appropriate contact time as indicated for disinfecting solutions.     Leeanne Rio, PA-C

## 2020-04-17 ENCOUNTER — Telehealth: Payer: Self-pay | Admitting: Physician Assistant

## 2020-04-17 NOTE — Telephone Encounter (Signed)
Patient called and is requesting a TOC. Please advise.

## 2020-04-17 NOTE — Telephone Encounter (Signed)
Yes, I will see him.

## 2020-04-17 NOTE — Telephone Encounter (Signed)
He would not need a transfer of care on my end since I am leaving the practice. He is a very nice gentleman and has been a great patient so hoping Dr. Ronnald Ramp will be able to take him.

## 2020-05-01 ENCOUNTER — Telehealth: Payer: Self-pay

## 2020-05-01 NOTE — Chronic Care Management (AMB) (Signed)
Chronic Care Management Pharmacy Assistant   Name: Anthony Collins  MRN: [id number] DOB: [birth year]  Reason for Encounter: Disease State/ Hypertension Adherence Call  Patient Questions:  1.  Have you seen any other providers since your last visit? Yes, 04/05/2020 Sheilah Pigeon, PA-C   2.  Any changes in your medicines or health? Yes, patient states he has an appointment with a surgeon to have his hernia repaired on 05/16/2020.  PCP : Brunetta Jeans, PA-C  Allergies:  No Known Allergies  Medications: Outpatient Encounter Medications as of 05/01/2020  Medication Sig  . Bacillus Coagulans-Inulin (PROBIOTIC FORMULA PO) Take by mouth.  . Cholecalciferol (VITAMIN D3) 1000 UNITS CAPS Take 1 capsule by mouth daily.  . cyanocobalamin 1000 MCG tablet Take 1,000 mcg by mouth daily.  . finasteride (PROSCAR) 5 MG tablet TAKE 1 TABLET BY MOUTH EVERY DAY  . irbesartan (AVAPRO) 150 MG tablet TAKE 1 TABLET BY MOUTH EVERY DAY  . Lutein-Zeaxanthin 20-1 MG CAPS Take 1 capsule by mouth daily.  . metoprolol succinate (TOPROL-XL) 25 MG 24 hr tablet TAKE 1 TABLET BY MOUTH EVERY EVENING  . Multiple Vitamin (MULTIVITAMIN WITH MINERALS) TABS tablet Take 1 tablet by mouth daily.  Marland Kitchen omeprazole (PRILOSEC) 40 MG capsule Take 1 capsule (40 mg total) by mouth daily as needed (heart burn).  . tamsulosin (FLOMAX) 0.4 MG CAPS capsule TAKE 1 CAPSULE BY MOUTH EVERY DAY   No facility-administered encounter medications on file as of 05/01/2020.    Current Diagnosis: Patient Active Problem List   Diagnosis Date Noted  . Benign prostatic hyperplasia without lower urinary tract symptoms 05/24/2018  . Folliculitis 37/16/9678  . Prostate cancer screening 05/24/2018  . Direct inguinal hernia 01/12/2017  . Essential hypertension 06/09/2016  . Gastroesophageal reflux disease without esophagitis 06/09/2016  . Urinary frequency 06/09/2016  . Spinal stenosis of lumbar region 04/27/2015    Reviewed chart prior  to disease state call. Spoke with patient regarding BP  Recent Office Vitals: BP Readings from Last 3 Encounters:  04/05/20 138/82  12/09/19 128/80  09/19/19 (!) 160/82   Pulse Readings from Last 3 Encounters:  04/05/20 65  12/09/19 (!) 57  09/19/19 80    Wt Readings from Last 3 Encounters:  04/05/20 162 lb (73.5 kg)  12/09/19 159 lb (72.1 kg)  09/19/19 159 lb 2 oz (72.2 kg)     Kidney Function Lab Results  Component Value Date/Time   CREATININE 0.96 12/09/2019 09:35 AM   CREATININE 1.00 09/05/2019 09:18 AM   GFR 76.59 12/09/2019 09:35 AM   GFRNONAA >60 04/29/2017 12:45 PM   GFRAA >60 04/29/2017 12:45 PM    BMP Latest Ref Rng & Units 12/09/2019 09/05/2019 05/23/2019  Glucose 70 - 99 mg/dL 94 123(H) 95  BUN 6 - 23 mg/dL 25(H) 20 19  Creatinine 0.40 - 1.50 mg/dL 0.96 1.00 0.98  Sodium 135 - 145 mEq/L 140 139 140  Potassium 3.5 - 5.1 mEq/L 4.1 4.5 4.8  Chloride 96 - 112 mEq/L 104 102 103  CO2 19 - 32 mEq/L 29 30 29   Calcium 8.4 - 10.5 mg/dL 9.3 9.6 9.5    . Current antihypertensive regimen:  o Irbesartan 150 mg tablet daily o Metoprolol Succinate XL 25 mg tablet daily  . How often are you checking your Blood Pressure? daily   . Current home BP readings: 130's/80's  . What recent interventions/DTPs have been made by any provider to improve Blood Pressure control since last CPP Visit: Patient states  he takes his blood pressure medications as instructed.  . Any recent hospitalizations or ED visits since last visit with CPP? No , patient has not had any recent hospitalizations or ED visits since his last visit with Madelin Rear, CPP.  Marland Kitchen What diet changes have been made to improve Blood Pressure Control?  o Patient states he has made an effort to cut back on adding salt to his food. Patient states he tries to eat nutritiously and states he hasn't gained any weight. Patient reports his weight at 161 lbs.  . What exercise is being done to improve your Blood Pressure Control?   o Patient states he is a walker and likes to walk. Patient states when the weather permits he will walk 1-2 miles 3-4 times a week.   Adherence Review: Is the patient currently on ACE/ARB medication? Yes Does the patient have >5 day gap between last estimated fill dates? No  Patient states his new PCP will be Dr. Ronnald Ramp at [facility]. Patient states his first appointment with Dr. Ronnald Ramp is on 06/12/2020 to establish primary care.  April D Calhoun, [place] Pharmacist Assistant [phone]   Follow-Up:  Pharmacist Review

## 2020-05-16 ENCOUNTER — Ambulatory Visit: Payer: Self-pay | Admitting: Surgery

## 2020-05-16 DIAGNOSIS — K4091 Unilateral inguinal hernia, without obstruction or gangrene, recurrent: Secondary | ICD-10-CM | POA: Diagnosis not present

## 2020-05-16 NOTE — H&P (Signed)
Anthony Collins Appointment: 05/16/2020 9:40 AM Location: [facility] Patient #: [id number] DOB: 1946-01-13 Married / Language: Anthony Collins / Race: White Male  History of Present Illness Anthony Collins A. Christyn Gutkowski MD; 05/16/2020 1:46 PM) Patient words: Patient presented for evaluation of recurrent right inguinal hernia. Had an open inguinal hernia repair in 2019 by Dr. Hulen Skains with mesh. In the last year he has had progressive right groin swelling but no significant discomfort. Of late he's had more of an aching sensation in the right groin and wears a truss which alleviates his symptoms. He's had no nausea or vomiting or change in bowel or bladder function.  The patient is a 75 year old male.   Past Surgical History Anthony Collins, CMA; 05/16/2020 9:22 AM) Cataract Surgery Bilateral. Open Inguinal Hernia Surgery Bilateral, Left. Tonsillectomy Vasectomy  Allergies (Anthony Collins, CMA; 05/16/2020 9:24 AM) No Known Allergies [01/27/2017]: No Known Drug Allergies [05/16/2020]: Allergies Reconciled  Medication History (Anthony Collins, CMA; 05/16/2020 9:24 AM) Tums (500MG  Tablet Chewable, Oral) Active. Vitamin D3 (1000UNIT Capsule, Oral) Active. Finasteride (5MG  Tablet, Oral) Active. Ibuprofen (200MG  Tablet, Oral) Active. Irbesartan (300MG  Tablet, Oral) Active. Metoprolol Succinate ER (25MG  Tablet ER 24HR, Oral) Active. Multivitamins/Minerals (Oral) Active. Omeprazole (40MG  Capsule DR, Oral) Active. SF (1.1% Gel, Dental) Active. Tamsulosin HCl (0.4MG  Capsule, Oral) Active. Vitamin B-12 (500MCG Tablet, Oral) Active. Medications Reconciled  Social History Anthony Collins, CMA; 05/16/2020 9:22 AM) Alcohol use Moderate alcohol use. Caffeine use Coffee. No drug use Tobacco use Never smoker.  Family History Anthony Collins, CMA; 05/16/2020 9:22 AM) Arthritis Mother. Breast Cancer Mother. Depression  Sister. Hypertension Mother. Respiratory Condition Father, Mother. Thyroid problems Mother.  Other Problems Anthony Collins, CMA; 05/16/2020 9:22 AM) Back Pain Enlarged Prostate Gastroesophageal Reflux Disease High blood pressure Inguinal Hernia Kidney Stone     Review of Systems Anthony Collins CMA; 05/16/2020 9:22 AM) General Not Present- Appetite Loss, Chills, Fatigue, Fever, Night Sweats, Weight Gain and Weight Loss. Skin Not Present- Change in Wart/Mole, Dryness, Hives, Jaundice, New Lesions, Non-Healing Wounds, Rash and Ulcer. HEENT Present- Wears glasses/contact lenses. Not Present- Earache, Hearing Loss, Hoarseness, Nose Bleed, Oral Ulcers, Ringing in the Ears, Seasonal Allergies, Sinus Pain, Sore Throat, Visual Disturbances and Yellow Eyes. Respiratory Not Present- Bloody sputum, Chronic Cough, Difficulty Breathing, Snoring and Wheezing. Breast Not Present- Breast Mass, Breast Pain, Nipple Discharge and Skin Changes. Cardiovascular Not Present- Chest Pain, Difficulty Breathing Lying Down, Leg Cramps, Palpitations, Rapid Heart Rate, Shortness of Breath and Swelling of Extremities. Gastrointestinal Present- Indigestion. Not Present- Abdominal Pain, Bloating, Bloody Stool, Change in Bowel Habits, Chronic diarrhea, Constipation, Difficulty Swallowing, Excessive gas, Gets full quickly at meals, Hemorrhoids, Nausea, Rectal Pain and Vomiting. Male Genitourinary Not Present- Blood in Urine, Change in Urinary Stream, Frequency, Impotence, Nocturia, Painful Urination, Urgency and Urine Leakage. Musculoskeletal Present- Joint Stiffness. Not Present- Back Pain, Joint Pain, Muscle Pain, Muscle Weakness and Swelling of Extremities.  Vitals (Anthony Collins CMA; 05/16/2020 9:24 AM) 05/16/2020 9:24 AM Weight: 157 lb Height: 68in Body Surface Area: 1.84 m Body Mass Index: 23.87 kg/m  Temp.: 98.75F  Pulse: 112 (Regular)  P.OX: 98% (Room air) BP:  126/82(Sitting, Left Arm, Standard)        Physical Exam (Anthony Hefel A. Mosiah Bastin MD; 05/16/2020 1:46 PM)  General Mental Status-Alert. General Appearance-Consistent with stated age. Hydration-Well hydrated. Voice-Normal.  Chest and Lung Exam Chest and lung exam reveals -quiet, even and easy respiratory effort with no use of accessory muscles and on auscultation, normal breath sounds, no adventitious sounds and normal  vocal resonance. Inspection Chest Wall - Normal. Back - normal.  Cardiovascular Cardiovascular examination reveals -normal heart sounds, regular rate and rhythm with no murmurs and normal pedal pulses bilaterally.  Abdomen Note: Scarred right groin. Recurrent hernia noted which is easily reducible on the right. Scar left groin noted. No evidence of left inguinal hernia.  Neurologic Neurologic evaluation reveals -alert and oriented x 3 with no impairment of recent or remote memory. Mental Status-Normal.  Musculoskeletal Normal Exam - Left-Upper Extremity Strength Normal and Lower Extremity Strength Normal. Normal Exam - Right-Upper Extremity Strength Normal and Lower Extremity Strength Normal.    Assessment & Plan (Anthony Sigl A. Kateena Degroote MD; 05/16/2020 1:47 PM)  RECURRENT RIGHT INGUINAL HERNIA (K40.91) Impression: Discuss repair options to include laparoscopic repair with mesh versus open repair with mesh. He has chosen a laparoscopic right inguinal hernia repair with mesh. We discussed the possibility of an open repair as well if necessary if this fails. He understands that risk and agrees to proceed. Risk of bleeding, infection, recurrence, bowel injury, bladder injury, injury to neighboring structures such as blood vessels and nerves, the need for revisional and/or recurrent surgery if necessary and the use of mesh.  Current Plans You are being scheduled for surgery- Our schedulers will call you.  You should hear from our office's scheduling  department within 5 working days about the location, date, and time of surgery. We try to make accommodations for patient's preferences in scheduling surgery, but sometimes the OR schedule or the surgeon's schedule prevents [place] from making those accommodations.  If you have not heard from our office [phone]) in 5 working days, call the office and ask for your surgeon's nurse.  If you have other questions about your diagnosis, plan, or surgery, call the office and ask for your surgeon's nurse.  The anatomy & physiology of the abdominal wall and pelvic floor was discussed. The pathophysiology of hernias in the inguinal and pelvic region was discussed. Natural history risks such as progressive enlargement, pain, incarceration, and strangulation was discussed. Contributors to complications such as smoking, obesity, diabetes, prior surgery, etc were discussed.  I feel the risks of no intervention will lead to serious problems that outweigh the operative risks; therefore, I recommended surgery to reduce and repair the hernia. I explained laparoscopic techniques with possible need for an open approach. I noted usual use of mesh to patch and/or buttress hernia repair  Risks such as bleeding, infection, abscess, need for further treatment, heart attack, death, and other risks were discussed. I noted a good likelihood this will help address the problem. Goals of post-operative recovery were discussed as well. Possibility that this will not correct all symptoms was explained. I stressed the importance of low-impact activity, aggressive pain control, avoiding constipation, & not pushing through pain to minimize risk of post-operative chronic pain or injury. Possibility of reherniation was discussed. We will work to minimize complications.  An educational handout further explaining the pathology & treatment options was given as well. Questions were answered. The patient expresses understanding  & wishes to proceed with surgery.  Pt Education - CCS Mesh education: discussed with patient and provided information.

## 2020-05-16 NOTE — H&P (View-Only) (Signed)
Bing Ree Appointment: 05/16/2020 9:40 AM Location: [facility] Patient #: [id number] DOB: February 18, 1946 Married / Language: Anthony Collins / Race: White Male  History of Present Illness Anthony Collins A. Robert Sunga MD; 05/16/2020 1:46 PM) Patient words: Patient presented for evaluation of recurrent right inguinal hernia. Had an open inguinal hernia repair in 2019 by Dr. Hulen Skains with mesh. In the last year he has had progressive right groin swelling but no significant discomfort. Of late he's had more of an aching sensation in the right groin and wears a truss which alleviates his symptoms. He's had no nausea or vomiting or change in bowel or bladder function.  The patient is a 75 year old male.   Past Surgical History Hortencia Conradi, CMA; 05/16/2020 9:22 AM) Cataract Surgery Bilateral. Open Inguinal Hernia Surgery Bilateral, Left. Tonsillectomy Vasectomy  Allergies (Kheana Marshall-McBride, CMA; 05/16/2020 9:24 AM) No Known Allergies [01/27/2017]: No Known Drug Allergies [05/16/2020]: Allergies Reconciled  Medication History (Kheana Marshall-McBride, CMA; 05/16/2020 9:24 AM) Tums (500MG  Tablet Chewable, Oral) Active. Vitamin D3 (1000UNIT Capsule, Oral) Active. Finasteride (5MG  Tablet, Oral) Active. Ibuprofen (200MG  Tablet, Oral) Active. Irbesartan (300MG  Tablet, Oral) Active. Metoprolol Succinate ER (25MG  Tablet ER 24HR, Oral) Active. Multivitamins/Minerals (Oral) Active. Omeprazole (40MG  Capsule DR, Oral) Active. SF (1.1% Gel, Dental) Active. Tamsulosin HCl (0.4MG  Capsule, Oral) Active. Vitamin B-12 (500MCG Tablet, Oral) Active. Medications Reconciled  Social History Hortencia Conradi, CMA; 05/16/2020 9:22 AM) Alcohol use Moderate alcohol use. Caffeine use Coffee. No drug use Tobacco use Never smoker.  Family History Hortencia Conradi, CMA; 05/16/2020 9:22 AM) Arthritis Mother. Breast Cancer Mother. Depression  Sister. Hypertension Mother. Respiratory Condition Father, Mother. Thyroid problems Mother.  Other Problems Sherron Ales Marshall-McBride, CMA; 05/16/2020 9:22 AM) Back Pain Enlarged Prostate Gastroesophageal Reflux Disease High blood pressure Inguinal Hernia Kidney Stone     Review of Systems Sherron Ales Marshall-McBride CMA; 05/16/2020 9:22 AM) General Not Present- Appetite Loss, Chills, Fatigue, Fever, Night Sweats, Weight Gain and Weight Loss. Skin Not Present- Change in Wart/Mole, Dryness, Hives, Jaundice, New Lesions, Non-Healing Wounds, Rash and Ulcer. HEENT Present- Wears glasses/contact lenses. Not Present- Earache, Hearing Loss, Hoarseness, Nose Bleed, Oral Ulcers, Ringing in the Ears, Seasonal Allergies, Sinus Pain, Sore Throat, Visual Disturbances and Yellow Eyes. Respiratory Not Present- Bloody sputum, Chronic Cough, Difficulty Breathing, Snoring and Wheezing. Breast Not Present- Breast Mass, Breast Pain, Nipple Discharge and Skin Changes. Cardiovascular Not Present- Chest Pain, Difficulty Breathing Lying Down, Leg Cramps, Palpitations, Rapid Heart Rate, Shortness of Breath and Swelling of Extremities. Gastrointestinal Present- Indigestion. Not Present- Abdominal Pain, Bloating, Bloody Stool, Change in Bowel Habits, Chronic diarrhea, Constipation, Difficulty Swallowing, Excessive gas, Gets full quickly at meals, Hemorrhoids, Nausea, Rectal Pain and Vomiting. Male Genitourinary Not Present- Blood in Urine, Change in Urinary Stream, Frequency, Impotence, Nocturia, Painful Urination, Urgency and Urine Leakage. Musculoskeletal Present- Joint Stiffness. Not Present- Back Pain, Joint Pain, Muscle Pain, Muscle Weakness and Swelling of Extremities.  Vitals (Kheana Marshall-McBride CMA; 05/16/2020 9:24 AM) 05/16/2020 9:24 AM Weight: 157 lb Height: 68in Body Surface Area: 1.84 m Body Mass Index: 23.87 kg/m  Temp.: 98.7F  Pulse: 112 (Regular)  P.OX: 98% (Room air) BP:  126/82(Sitting, Left Arm, Standard)        Physical Exam (Gladiola Madore A. Salena Ortlieb MD; 05/16/2020 1:46 PM)  General Mental Status-Alert. General Appearance-Consistent with stated age. Hydration-Well hydrated. Voice-Normal.  Chest and Lung Exam Chest and lung exam reveals -quiet, even and easy respiratory effort with no use of accessory muscles and on auscultation, normal breath sounds, no adventitious sounds and normal  vocal resonance. Inspection Chest Wall - Normal. Back - normal.  Cardiovascular Cardiovascular examination reveals -normal heart sounds, regular rate and rhythm with no murmurs and normal pedal pulses bilaterally.  Abdomen Note: Scarred right groin. Recurrent hernia noted which is easily reducible on the right. Scar left groin noted. No evidence of left inguinal hernia.  Neurologic Neurologic evaluation reveals -alert and oriented x 3 with no impairment of recent or remote memory. Mental Status-Normal.  Musculoskeletal Normal Exam - Left-Upper Extremity Strength Normal and Lower Extremity Strength Normal. Normal Exam - Right-Upper Extremity Strength Normal and Lower Extremity Strength Normal.    Assessment & Plan (Kirke Breach A. Kim Oki MD; 05/16/2020 1:47 PM)  RECURRENT RIGHT INGUINAL HERNIA (K40.91) Impression: Discuss repair options to include laparoscopic repair with mesh versus open repair with mesh. He has chosen a laparoscopic right inguinal hernia repair with mesh. We discussed the possibility of an open repair as well if necessary if this fails. He understands that risk and agrees to proceed. Risk of bleeding, infection, recurrence, bowel injury, bladder injury, injury to neighboring structures such as blood vessels and nerves, the need for revisional and/or recurrent surgery if necessary and the use of mesh.  Current Plans You are being scheduled for surgery- Our schedulers will call you.  You should hear from our office's scheduling  department within 5 working days about the location, date, and time of surgery. We try to make accommodations for patient's preferences in scheduling surgery, but sometimes the OR schedule or the surgeon's schedule prevents [place] from making those accommodations.  If you have not heard from our office [phone]) in 5 working days, call the office and ask for your surgeon's nurse.  If you have other questions about your diagnosis, plan, or surgery, call the office and ask for your surgeon's nurse.  The anatomy & physiology of the abdominal wall and pelvic floor was discussed. The pathophysiology of hernias in the inguinal and pelvic region was discussed. Natural history risks such as progressive enlargement, pain, incarceration, and strangulation was discussed. Contributors to complications such as smoking, obesity, diabetes, prior surgery, etc were discussed.  I feel the risks of no intervention will lead to serious problems that outweigh the operative risks; therefore, I recommended surgery to reduce and repair the hernia. I explained laparoscopic techniques with possible need for an open approach. I noted usual use of mesh to patch and/or buttress hernia repair  Risks such as bleeding, infection, abscess, need for further treatment, heart attack, death, and other risks were discussed. I noted a good likelihood this will help address the problem. Goals of post-operative recovery were discussed as well. Possibility that this will not correct all symptoms was explained. I stressed the importance of low-impact activity, aggressive pain control, avoiding constipation, & not pushing through pain to minimize risk of post-operative chronic pain or injury. Possibility of reherniation was discussed. We will work to minimize complications.  An educational handout further explaining the pathology & treatment options was given as well. Questions were answered. The patient expresses understanding  & wishes to proceed with surgery.  Pt Education - CCS Mesh education: discussed with patient and provided information.

## 2020-05-21 ENCOUNTER — Telehealth: Payer: Self-pay

## 2020-05-21 NOTE — Chronic Care Management (AMB) (Signed)
    Chronic Care Management Pharmacy Assistant   Name: Anthony Collins  MRN: [id number] DOB: 03-02-1946  Reason for Encounter: Chart Review  PCP : Brunetta Jeans, PA-C  Allergies:  No Known Allergies  Medications: Outpatient Encounter Medications as of 05/21/2020  Medication Sig  . Bacillus Coagulans-Inulin (PROBIOTIC FORMULA PO) Take by mouth.  . Cholecalciferol (VITAMIN D3) 1000 UNITS CAPS Take 1 capsule by mouth daily.  . cyanocobalamin 1000 MCG tablet Take 1,000 mcg by mouth daily.  . finasteride (PROSCAR) 5 MG tablet TAKE 1 TABLET BY MOUTH EVERY DAY  . irbesartan (AVAPRO) 150 MG tablet TAKE 1 TABLET BY MOUTH EVERY DAY  . Lutein-Zeaxanthin 20-1 MG CAPS Take 1 capsule by mouth daily.  . metoprolol succinate (TOPROL-XL) 25 MG 24 hr tablet TAKE 1 TABLET BY MOUTH EVERY EVENING  . Multiple Vitamin (MULTIVITAMIN WITH MINERALS) TABS tablet Take 1 tablet by mouth daily.  Marland Kitchen omeprazole (PRILOSEC) 40 MG capsule Take 1 capsule (40 mg total) by mouth daily as needed (heart burn).  . tamsulosin (FLOMAX) 0.4 MG CAPS capsule TAKE 1 CAPSULE BY MOUTH EVERY DAY   No facility-administered encounter medications on file as of 05/21/2020.    Current Diagnosis: Patient Active Problem List   Diagnosis Date Noted  . Benign prostatic hyperplasia without lower urinary tract symptoms 05/24/2018  . Folliculitis 32/54/9826  . Prostate cancer screening 05/24/2018  . Direct inguinal hernia 01/12/2017  . Essential hypertension 06/09/2016  . Gastroesophageal reflux disease without esophagitis 06/09/2016  . Urinary frequency 06/09/2016  . Spinal stenosis of lumbar region 04/27/2015    Reviewed chart for medication changes. No OVs, Consults, or hospital visits since last care coordination call/Pharmacist visit.  No medication changes indicated.  Future Appointments  Date Time Provider [place]  06/11/2020  8:20 AM Janith Lima, MD [facility] None  07/05/2020  9:00 AM [facility] CCM PHARMACIST  [facility]    April D Calhoun, [place] Pharmacist Assistant [phone]   Follow-Up:  Pharmacist Review

## 2020-06-06 ENCOUNTER — Encounter (HOSPITAL_BASED_OUTPATIENT_CLINIC_OR_DEPARTMENT_OTHER): Payer: Self-pay | Admitting: Surgery

## 2020-06-06 ENCOUNTER — Other Ambulatory Visit: Payer: Self-pay

## 2020-06-11 ENCOUNTER — Encounter: Payer: PPO | Admitting: Physician Assistant

## 2020-06-11 ENCOUNTER — Other Ambulatory Visit: Payer: Self-pay

## 2020-06-11 ENCOUNTER — Other Ambulatory Visit (HOSPITAL_COMMUNITY)
Admission: RE | Admit: 2020-06-11 | Discharge: 2020-06-11 | Disposition: A | Discharge status: Home / Self Care | Payer: PPO | Source: Ambulatory Visit | Attending: Surgery | Admitting: Surgery

## 2020-06-11 ENCOUNTER — Telehealth: Payer: Self-pay | Admitting: Internal Medicine

## 2020-06-11 ENCOUNTER — Encounter: Payer: Self-pay | Admitting: Internal Medicine

## 2020-06-11 ENCOUNTER — Encounter (HOSPITAL_BASED_OUTPATIENT_CLINIC_OR_DEPARTMENT_OTHER)
Admission: RE | Admit: 2020-06-11 | Discharge: 2020-06-11 | Disposition: A | Discharge status: Home / Self Care | Payer: PPO | Source: Ambulatory Visit | Attending: Surgery | Admitting: Surgery

## 2020-06-11 ENCOUNTER — Ambulatory Visit (INDEPENDENT_AMBULATORY_CARE_PROVIDER_SITE_OTHER): Payer: PPO | Admitting: Internal Medicine

## 2020-06-11 VITALS — BP 158/84 | HR 74 | Temp 98.2°F | Resp 16 | Ht 68.0 in | Wt 159.0 lb

## 2020-06-11 DIAGNOSIS — K219 Gastro-esophageal reflux disease without esophagitis: Secondary | ICD-10-CM

## 2020-06-11 DIAGNOSIS — Z01812 Encounter for preprocedural laboratory examination: Secondary | ICD-10-CM | POA: Insufficient documentation

## 2020-06-11 DIAGNOSIS — E785 Hyperlipidemia, unspecified: Secondary | ICD-10-CM

## 2020-06-11 DIAGNOSIS — L309 Dermatitis, unspecified: Secondary | ICD-10-CM

## 2020-06-11 DIAGNOSIS — Z23 Encounter for immunization: Secondary | ICD-10-CM

## 2020-06-11 DIAGNOSIS — Z20822 Contact with and (suspected) exposure to covid-19: Secondary | ICD-10-CM | POA: Insufficient documentation

## 2020-06-11 DIAGNOSIS — I10 Essential (primary) hypertension: Secondary | ICD-10-CM

## 2020-06-11 DIAGNOSIS — N400 Enlarged prostate without lower urinary tract symptoms: Secondary | ICD-10-CM

## 2020-06-11 DIAGNOSIS — R0600 Dyspnea, unspecified: Secondary | ICD-10-CM | POA: Insufficient documentation

## 2020-06-11 DIAGNOSIS — R350 Frequency of micturition: Secondary | ICD-10-CM

## 2020-06-11 DIAGNOSIS — Z0000 Encounter for general adult medical examination without abnormal findings: Secondary | ICD-10-CM | POA: Diagnosis not present

## 2020-06-11 DIAGNOSIS — Z0001 Encounter for general adult medical examination with abnormal findings: Secondary | ICD-10-CM

## 2020-06-11 DIAGNOSIS — R0609 Other forms of dyspnea: Secondary | ICD-10-CM

## 2020-06-11 LAB — COMPREHENSIVE METABOLIC PANEL
ALT: 24 U/L (ref 0–44)
AST: 25 U/L (ref 15–41)
Albumin: 4 g/dL (ref 3.5–5.0)
Alkaline Phosphatase: 70 U/L (ref 38–126)
Anion gap: 8 (ref 5–15)
BUN: 15 mg/dL (ref 8–23)
CO2: 28 mmol/L (ref 22–32)
Calcium: 9.7 mg/dL (ref 8.9–10.3)
Chloride: 103 mmol/L (ref 98–111)
Creatinine, Ser: 1.02 mg/dL (ref 0.61–1.24)
GFR, Estimated: >60 mL/min (ref >60)
Glucose, Bld: 105 mg/dL — ABNORMAL HIGH (ref 70–99)
Potassium: 5.2 mmol/L — ABNORMAL HIGH (ref 3.5–5.1)
Sodium: 139 mmol/L (ref 135–145)
Total Bilirubin: 0.9 mg/dL (ref 0.3–1.2)
Total Protein: 7.5 g/dL (ref 6.5–8.1)

## 2020-06-11 LAB — BASIC METABOLIC PANEL
BUN: 16 mg/dL (ref 6–23)
CO2: 30 mEq/L (ref 19–32)
Calcium: 9.8 mg/dL (ref 8.4–10.5)
Chloride: 102 mEq/L (ref 96–112)
Creatinine, Ser: 1.01 mg/dL (ref 0.40–1.50)
GFR: 73.33 mL/min (ref >60.00)
Glucose, Bld: 113 mg/dL — ABNORMAL HIGH (ref 70–99)
Potassium: 4.7 mEq/L (ref 3.5–5.1)
Sodium: 140 mEq/L (ref 135–145)

## 2020-06-11 LAB — CBC WITH DIFFERENTIAL/PLATELET
Abs Immature Granulocytes: 0.01 10*3/uL (ref 0.00–0.07)
Basophils Absolute: 0 10*3/uL (ref 0.0–0.1)
Basophils Relative: 0 %
Eosinophils Absolute: 0.1 10*3/uL (ref 0.0–0.5)
Eosinophils Relative: 1 %
HCT: 37.9 % — ABNORMAL LOW (ref 39.0–52.0)
Hemoglobin: 13.3 g/dL (ref 13.0–17.0)
Immature Granulocytes: 0 %
Lymphocytes Relative: 16 %
Lymphs Abs: 0.8 10*3/uL (ref 0.7–4.0)
MCH: 31.7 pg (ref 26.0–34.0)
MCHC: 35.1 g/dL (ref 30.0–36.0)
MCV: 90.2 fL (ref 80.0–100.0)
Monocytes Absolute: 0.5 10*3/uL (ref 0.1–1.0)
Monocytes Relative: 9 %
Neutro Abs: 4 10*3/uL (ref 1.7–7.7)
Neutrophils Relative %: 74 %
Platelets: 275 10*3/uL (ref 150–400)
RBC: 4.2 MIL/uL — ABNORMAL LOW (ref 4.22–5.81)
RDW: 11.9 % (ref 11.5–15.5)
WBC: 5.4 10*3/uL (ref 4.0–10.5)
nRBC: 0 % (ref 0.0–0.2)

## 2020-06-11 LAB — LIPID PANEL
Cholesterol: 157 mg/dL (ref 0–200)
HDL: 59.6 mg/dL (ref >39.00)
LDL Cholesterol: 79 mg/dL (ref 0–99)
NonHDL: 96.93
Total CHOL/HDL Ratio: 3
Triglycerides: 92 mg/dL (ref 0.0–149.0)
VLDL: 18.4 mg/dL (ref 0.0–40.0)

## 2020-06-11 LAB — PSA: PSA: 0.99 ng/mL (ref 0.10–4.00)

## 2020-06-11 MED ORDER — FINASTERIDE 5 MG PO TABS
5.0000 mg | ORAL_TABLET | Freq: Every day | ORAL | 1 refills | Status: DC
Start: 2020-06-11 — End: 2020-12-12

## 2020-06-11 MED ORDER — TAMSULOSIN HCL 0.4 MG PO CAPS: 0.4000 mg | ORAL_CAPSULE | Freq: Every day | ORAL | 1 refills | Status: DC

## 2020-06-11 MED ORDER — OMEPRAZOLE 40 MG PO CPDR: 40.0000 mg | DELAYED_RELEASE_CAPSULE | Freq: Every day | ORAL | 3 refills | Status: DC | PRN

## 2020-06-11 MED ORDER — CLOTRIMAZOLE-BETAMETHASONE 1-0.05 % EX CREA: 1.0000 "application " | TOPICAL_CREAM | Freq: Two times a day (BID) | CUTANEOUS | 3 refills | Status: DC

## 2020-06-11 MED ORDER — IRBESARTAN 150 MG PO TABS: 150.0000 mg | ORAL_TABLET | Freq: Every day | ORAL | 1 refills | Status: DC

## 2020-06-11 MED ORDER — METOPROLOL SUCCINATE ER 25 MG PO TB24: 25.0000 mg | ORAL_TABLET | Freq: Every evening | ORAL | 1 refills | Status: DC

## 2020-06-11 NOTE — Progress Notes (Signed)
Subjective:  Patient ID: Anthony Collins, male    DOB: 1946/02/08  Age: 75 y.o. MRN: [id number]  CC: Annual Exam, Hypertension, Hyperlipidemia, and Gastroesophageal Reflux  This visit occurred during the SARS-CoV-2 public health emergency.  Safety protocols were in place, including screening questions prior to the visit, additional usage of staff PPE, and extensive cleaning of exam room while observing appropriate contact time as indicated for disinfecting solutions.    HPI Anthony Collins presents for a CPX and to establish.  He complains that over the last few months he has developed DOE after he climbs 3 flights of stairs.  He denies chest pain, diaphoresis, palpitations, dizziness, lightheadedness, or near syncope.  He tells me his blood pressure is well controlled at home.  Around 125-130/80.  He admits that he has whitecoat HTN.  He has a chronic rash around his anus and buttocks.  He applies Goldbond cream to the area.  He states the Goldbond cream helps some but he would like to try something else to treat the symptoms.  History Anthony Collins has a past medical history of Bladder calculus, GERD (gastroesophageal reflux disease), History of kidney stones, Hypertension, Spinal stenosis, Urgency of urination, and Wears glasses.   He has a past surgical history that includes Tonsillectomy (age 43); Extracorporeal shock wave lithotripsy (2004); Eye surgery; Blepharoplasty; Inguinal hernia repair (Right, 05/04/2017); Insertion of mesh (Right, 05/04/2017); Inguinal hernia repair (Left, 1998  (spinal anesthesia)); and Inguinal hernia repair (Bilateral, 06/13/2020).   His family history includes Breast cancer in his mother; Hypertension in his mother; Lung cancer in his father.He reports that he has never smoked. He has never used smokeless tobacco. He reports current alcohol use of about 14.0 standard drinks of alcohol per week. He reports that he does not use drugs.  Outpatient Medications Prior to  Visit  Medication Sig Dispense Refill  . Bacillus Coagulans-Inulin (PROBIOTIC FORMULA PO) Take by mouth.    . Cholecalciferol (VITAMIN D3) 1000 UNITS CAPS Take 1 capsule by mouth daily.    . cyanocobalamin 1000 MCG tablet Take 1,000 mcg by mouth daily.    . Lutein-Zeaxanthin 20-1 MG CAPS Take 1 capsule by mouth daily.    . Multiple Vitamin (MULTIVITAMIN WITH MINERALS) TABS tablet Take 1 tablet by mouth daily.    . finasteride (PROSCAR) 5 MG tablet TAKE 1 TABLET BY MOUTH EVERY DAY 90 tablet 1  . irbesartan (AVAPRO) 150 MG tablet TAKE 1 TABLET BY MOUTH EVERY DAY 90 tablet 0  . metoprolol succinate (TOPROL-XL) 25 MG 24 hr tablet TAKE 1 TABLET BY MOUTH EVERY EVENING 90 tablet 1  . omeprazole (PRILOSEC) 40 MG capsule Take 1 capsule (40 mg total) by mouth daily as needed (heart burn). 90 capsule 3  . tamsulosin (FLOMAX) 0.4 MG CAPS capsule TAKE 1 CAPSULE BY MOUTH EVERY DAY 90 capsule 1   No facility-administered medications prior to visit.    ROS Review of Systems  Constitutional: Negative for chills, diaphoresis, fatigue and fever.  HENT: Negative.   Eyes: Negative.   Respiratory: Positive for shortness of breath. Negative for chest tightness.   Cardiovascular: Negative for palpitations and leg swelling.  Gastrointestinal: Negative for abdominal pain, constipation, nausea and vomiting.  Endocrine: Negative.   Genitourinary: Negative.  Negative for difficulty urinating and hematuria.  Musculoskeletal: Negative for arthralgias and myalgias.  Skin: Positive for rash. Negative for color change.  Neurological: Negative.  Negative for dizziness, weakness, light-headedness and numbness.  Hematological: Negative for adenopathy. Does not bruise/bleed easily.  Psychiatric/Behavioral: Negative.     Objective:  BP (!) 158/84 (BP Location: Left Arm, Patient Position: Sitting, Cuff Size: Normal)   Pulse 74   Temp 98.2 F (36.8 C) (Oral)   Resp 16   Ht 5\' 8"  (1.727 m)   Wt 159 lb (72.1 kg)    SpO2 98%   BMI 24.18 kg/m   Physical Exam Vitals reviewed.  Constitutional:      Appearance: Normal appearance.  HENT:     Nose: Nose normal.     Mouth/Throat:     Mouth: Mucous membranes are moist.  Eyes:     General: No scleral icterus.    Conjunctiva/sclera: Conjunctivae normal.  Cardiovascular:     Rate and Rhythm: Normal rate and regular rhythm.     Heart sounds: Normal heart sounds, S1 normal and S2 normal. No friction rub. No gallop.      Comments: EKG - NSR Normal EKG Pulmonary:     Effort: Pulmonary effort is normal.     Breath sounds: No stridor. No wheezing, rhonchi or rales.  Abdominal:     General: Abdomen is flat.     Palpations: There is no mass.     Tenderness: There is no abdominal tenderness. There is no guarding.     Hernia: A hernia is present. Hernia is present in the right inguinal area. There is no hernia in the left inguinal area.  Genitourinary:    Pubic Area: Rash present.     Penis: Normal and circumcised.      Testes: Normal.     Epididymis:     Right: Normal.     Left: Normal.     Prostate: Enlarged. Not tender and no nodules present.     Rectum: Normal. Guaiac result negative. No mass, tenderness, anal fissure, external hemorrhoid or internal hemorrhoid. Normal anal tone.    Musculoskeletal:     Cervical back: Neck supple.     Right lower leg: No edema.     Left lower leg: No edema.  Lymphadenopathy:     Cervical: No cervical adenopathy.     Lower Body: No right inguinal adenopathy. No left inguinal adenopathy.  Skin:    General: Skin is warm and dry.     Findings: Rash present.  Neurological:     General: No focal deficit present.     Mental Status: He is alert. Mental status is at baseline.  Psychiatric:        Mood and Affect: Mood normal.        Behavior: Behavior normal.     Lab Results  Component Value Date   WBC 5.4 06/11/2020   HGB 13.3 06/11/2020   HCT 37.9 (L) 06/11/2020   PLT 275 06/11/2020   GLUCOSE 105 (H)  06/11/2020   CHOL 157 06/11/2020   TRIG 92.0 06/11/2020   HDL 59.60 06/11/2020   LDLCALC 79 06/11/2020   ALT 24 06/11/2020   AST 25 06/11/2020   NA 139 06/11/2020   K 5.2 (H) 06/11/2020   CL 103 06/11/2020   CREATININE 1.02 06/11/2020   BUN 15 06/11/2020   CO2 28 06/11/2020   TSH 2.43 09/05/2019   PSA 0.99 06/11/2020   HGBA1C 5.2 01/13/2017    Assessment & Plan:   Anthony Collins was seen today for annual exam, hypertension, hyperlipidemia and gastroesophageal reflux.  Diagnoses and all orders for this visit:  Benign prostatic hyperplasia without lower urinary tract symptoms- His symptoms are well controlled. -     PSA;  Future -     finasteride (PROSCAR) 5 MG tablet; Take 1 tablet (5 mg total) by mouth daily. -     tamsulosin (FLOMAX) 0.4 MG CAPS capsule; Take 1 capsule (0.4 mg total) by mouth daily. -     PSA  Essential hypertension- Based on his home readings his blood pressure is adequately well controlled.  He has mild hyperkalemia.  Will continue the current dose of the ARB. -     Basic metabolic panel; Future -     irbesartan (AVAPRO) 150 MG tablet; Take 1 tablet (150 mg total) by mouth daily. -     metoprolol succinate (TOPROL-XL) 25 MG 24 hr tablet; Take 1 tablet (25 mg total) by mouth every evening. -     EKG 12-Lead -     Basic metabolic panel  Hyperlipidemia with target LDL less than 100- His ASCVD is just above 16%.  I recommended that he take a statin for cardiovascular risk reduction. -     Lipid panel; Future -     Lipid panel  Gastroesophageal reflux disease without esophagitis- His symptoms are well controlled. -     omeprazole (PRILOSEC) 40 MG capsule; Take 1 capsule (40 mg total) by mouth daily as needed (heart burn).  Urinary frequency  Perianal dermatitis- This may be contact dermatitis from the Goldbond powder.  I recommended that he stop using that and switch to the combination of clotrimazole and betamethasone. -     clotrimazole-betamethasone  (LOTRISONE) cream; Apply 1 application topically 2 (two) times daily.  DOE (dyspnea on exertion)- His EKG is reassuring.  I recommended that he undergo a CT cardiac scoring to screen for atherosclerosis. -     CT CARDIAC SCORING (SELF PAY ONLY); Future  Encounter for general adult medical examination with abnormal findings- Exam completed, labs reviewed, vaccines reviewed and updated, cancer screenings are up-to-date, patient education was given.  Other orders -     Pneumococcal polysaccharide vaccine 23-valent greater than or equal to 2yo subcutaneous/IM   I have changed Anthony Collins. Anthony Collins's finasteride, irbesartan, tamsulosin, and metoprolol succinate. I am also having him start on clotrimazole-betamethasone and rosuvastatin. Additionally, I am having him maintain his multivitamin with minerals, Vitamin D3, Bacillus Coagulans-Inulin (PROBIOTIC FORMULA PO), cyanocobalamin, Lutein-Zeaxanthin, and omeprazole.  Meds ordered this encounter  Medications  . finasteride (PROSCAR) 5 MG tablet    Sig: Take 1 tablet (5 mg total) by mouth daily.    Dispense:  90 tablet    Refill:  1  . irbesartan (AVAPRO) 150 MG tablet    Sig: Take 1 tablet (150 mg total) by mouth daily.    Dispense:  90 tablet    Refill:  1  . omeprazole (PRILOSEC) 40 MG capsule    Sig: Take 1 capsule (40 mg total) by mouth daily as needed (heart burn).    Dispense:  90 capsule    Refill:  3  . tamsulosin (FLOMAX) 0.4 MG CAPS capsule    Sig: Take 1 capsule (0.4 mg total) by mouth daily.    Dispense:  90 capsule    Refill:  1  . metoprolol succinate (TOPROL-XL) 25 MG 24 hr tablet    Sig: Take 1 tablet (25 mg total) by mouth every evening.    Dispense:  90 tablet    Refill:  1  . clotrimazole-betamethasone (LOTRISONE) cream    Sig: Apply 1 application topically 2 (two) times daily.    Dispense:  45 g    Refill:  3  . rosuvastatin (CRESTOR) 5 MG tablet    Sig: Take 1 tablet (5 mg total) by mouth daily.    Dispense:  90  tablet    Refill:  1       Follow-up: Return in about 6 months (around 12/12/2020).  Scarlette Calico, MD

## 2020-06-11 NOTE — Telephone Encounter (Signed)
EKG has been faxed.

## 2020-06-11 NOTE — Telephone Encounter (Signed)
Anthony Collins w/ [facility] called and is requesting the most recent EKG be faxed to the office before 06/13/20. He is scheduled to have surgery that day. Please advise   Fax: [phone] Phone: [phone]

## 2020-06-11 NOTE — Progress Notes (Signed)

## 2020-06-11 NOTE — Progress Notes (Signed)
Pt states had EKG with Dr. Ronnald Ramp today, notified office and requested EKG to be faxed to [place].

## 2020-06-11 NOTE — Patient Instructions (Signed)

## 2020-06-12 DIAGNOSIS — Z0001 Encounter for general adult medical examination with abnormal findings: Secondary | ICD-10-CM | POA: Insufficient documentation

## 2020-06-12 LAB — SARS CORONAVIRUS 2 (TAT 6-24 HRS): SARS Coronavirus 2: NEGATIVE

## 2020-06-13 ENCOUNTER — Ambulatory Visit (HOSPITAL_BASED_OUTPATIENT_CLINIC_OR_DEPARTMENT_OTHER): Payer: PPO | Admitting: Anesthesiology

## 2020-06-13 ENCOUNTER — Encounter (HOSPITAL_BASED_OUTPATIENT_CLINIC_OR_DEPARTMENT_OTHER)
Admission: RE | Disposition: A | Discharge status: Home / Self Care | Payer: Self-pay | Source: Home / Self Care | Attending: Surgery

## 2020-06-13 ENCOUNTER — Ambulatory Visit (HOSPITAL_BASED_OUTPATIENT_CLINIC_OR_DEPARTMENT_OTHER)
Admission: RE | Admit: 2020-06-13 | Discharge: 2020-06-13 | Disposition: A | Discharge status: Home / Self Care | Payer: PPO | Attending: Surgery | Admitting: Surgery

## 2020-06-13 ENCOUNTER — Encounter (HOSPITAL_BASED_OUTPATIENT_CLINIC_OR_DEPARTMENT_OTHER): Payer: Self-pay | Admitting: Surgery

## 2020-06-13 ENCOUNTER — Other Ambulatory Visit: Payer: Self-pay

## 2020-06-13 DIAGNOSIS — Z9852 Vasectomy status: Secondary | ICD-10-CM | POA: Diagnosis not present

## 2020-06-13 DIAGNOSIS — K4021 Bilateral inguinal hernia, without obstruction or gangrene, recurrent: Secondary | ICD-10-CM | POA: Insufficient documentation

## 2020-06-13 DIAGNOSIS — G8918 Other acute postprocedural pain: Secondary | ICD-10-CM | POA: Diagnosis not present

## 2020-06-13 DIAGNOSIS — I10 Essential (primary) hypertension: Secondary | ICD-10-CM | POA: Diagnosis not present

## 2020-06-13 DIAGNOSIS — K219 Gastro-esophageal reflux disease without esophagitis: Secondary | ICD-10-CM | POA: Diagnosis not present

## 2020-06-13 HISTORY — PX: INGUINAL HERNIA REPAIR: SHX194

## 2020-06-13 SURGERY — REPAIR, HERNIA, INGUINAL, LAPAROSCOPIC
Anesthesia: General | Site: Groin | Laterality: Bilateral

## 2020-06-13 MED ORDER — EPHEDRINE 5 MG/ML INJ
INTRAVENOUS | Status: AC
  Filled 2020-06-13: qty 10

## 2020-06-13 MED ORDER — OXYCODONE HCL 5 MG PO TABS: 5.0000 mg | ORAL_TABLET | Freq: Four times a day (QID) | ORAL | 0 refills | Status: DC | PRN

## 2020-06-13 MED ORDER — MIDAZOLAM HCL 2 MG/2ML IJ SOLN
2.0000 mg | Freq: Once | INTRAMUSCULAR | Status: AC
  Administered 2020-06-13: 2 mg via INTRAVENOUS

## 2020-06-13 MED ORDER — PROMETHAZINE HCL 25 MG/ML IJ SOLN: 6.2500 mg | INTRAMUSCULAR | Status: DC | PRN

## 2020-06-13 MED ORDER — ATROPINE SULFATE 0.4 MG/ML IJ SOLN
INTRAMUSCULAR | Status: DC | PRN
  Administered 2020-06-13: .4 mg via INTRAVENOUS

## 2020-06-13 MED ORDER — ONDANSETRON HCL 4 MG/2ML IJ SOLN
INTRAMUSCULAR | Status: DC | PRN
  Administered 2020-06-13: 4 mg via INTRAVENOUS

## 2020-06-13 MED ORDER — DEXAMETHASONE SODIUM PHOSPHATE 10 MG/ML IJ SOLN
INTRAMUSCULAR | Status: AC
  Filled 2020-06-13: qty 1

## 2020-06-13 MED ORDER — GABAPENTIN 300 MG PO CAPS
ORAL_CAPSULE | ORAL | Status: AC
  Filled 2020-06-13: qty 1

## 2020-06-13 MED ORDER — CEFAZOLIN SODIUM-DEXTROSE 2-4 GM/100ML-% IV SOLN
2.0000 g | INTRAVENOUS | Status: AC
  Administered 2020-06-13: 2 g via INTRAVENOUS

## 2020-06-13 MED ORDER — CHLORHEXIDINE GLUCONATE CLOTH 2 % EX PADS: 6.0000 | MEDICATED_PAD | Freq: Once | CUTANEOUS | Status: DC

## 2020-06-13 MED ORDER — FENTANYL CITRATE (PF) 100 MCG/2ML IJ SOLN
INTRAMUSCULAR | Status: AC
  Filled 2020-06-13: qty 2

## 2020-06-13 MED ORDER — IBUPROFEN 800 MG PO TABS: 800.0000 mg | ORAL_TABLET | Freq: Three times a day (TID) | ORAL | 0 refills | Status: DC | PRN

## 2020-06-13 MED ORDER — BUPIVACAINE-EPINEPHRINE 0.25% -1:200000 IJ SOLN
INTRAMUSCULAR | Status: DC | PRN
  Administered 2020-06-13: 1 mL

## 2020-06-13 MED ORDER — HYDROMORPHONE HCL 1 MG/ML IJ SOLN: 0.2500 mg | INTRAMUSCULAR | Status: DC | PRN

## 2020-06-13 MED ORDER — AMISULPRIDE (ANTIEMETIC) 5 MG/2ML IV SOLN: 10.0000 mg | Freq: Once | INTRAVENOUS | Status: DC | PRN

## 2020-06-13 MED ORDER — FENTANYL CITRATE (PF) 250 MCG/5ML IJ SOLN
INTRAMUSCULAR | Status: DC | PRN
  Administered 2020-06-13 (×2): 50 ug via INTRAVENOUS

## 2020-06-13 MED ORDER — PHENYLEPHRINE HCL-NACL 10-0.9 MG/250ML-% IV SOLN
INTRAVENOUS | Status: DC | PRN
  Administered 2020-06-13: 80 ug/min via INTRAVENOUS

## 2020-06-13 MED ORDER — FENTANYL CITRATE (PF) 100 MCG/2ML IJ SOLN
50.0000 ug | Freq: Once | INTRAMUSCULAR | Status: AC
  Administered 2020-06-13: 50 ug via INTRAVENOUS

## 2020-06-13 MED ORDER — CEFAZOLIN SODIUM-DEXTROSE 2-4 GM/100ML-% IV SOLN
INTRAVENOUS | Status: AC
  Filled 2020-06-13: qty 100

## 2020-06-13 MED ORDER — LIDOCAINE 2% (20 MG/ML) 5 ML SYRINGE
INTRAMUSCULAR | Status: AC
  Filled 2020-06-13: qty 5

## 2020-06-13 MED ORDER — LACTATED RINGERS IV SOLN: INTRAVENOUS | Status: DC

## 2020-06-13 MED ORDER — MIDAZOLAM HCL 2 MG/2ML IJ SOLN
INTRAMUSCULAR | Status: AC
  Filled 2020-06-13: qty 2

## 2020-06-13 MED ORDER — ROCURONIUM BROMIDE 10 MG/ML (PF) SYRINGE
PREFILLED_SYRINGE | INTRAVENOUS | Status: AC
  Filled 2020-06-13: qty 10

## 2020-06-13 MED ORDER — ROPIVACAINE HCL 5 MG/ML IJ SOLN
INTRAMUSCULAR | Status: DC | PRN
  Administered 2020-06-13: 30 mL via PERINEURAL

## 2020-06-13 MED ORDER — OXYCODONE HCL 5 MG/5ML PO SOLN: 5.0000 mg | Freq: Once | ORAL | Status: DC | PRN

## 2020-06-13 MED ORDER — LACTATED RINGERS IV SOLN: INTRAVENOUS | Status: DC | PRN

## 2020-06-13 MED ORDER — DEXAMETHASONE SODIUM PHOSPHATE 10 MG/ML IJ SOLN
INTRAMUSCULAR | Status: DC | PRN
  Administered 2020-06-13: 8 mg via INTRAVENOUS

## 2020-06-13 MED ORDER — GABAPENTIN 300 MG PO CAPS
300.0000 mg | ORAL_CAPSULE | ORAL | Status: AC
  Administered 2020-06-13: 300 mg via ORAL

## 2020-06-13 MED ORDER — ONDANSETRON HCL 4 MG/2ML IJ SOLN
INTRAMUSCULAR | Status: AC
  Filled 2020-06-13: qty 2

## 2020-06-13 MED ORDER — PHENYLEPHRINE 40 MCG/ML (10ML) SYRINGE FOR IV PUSH (FOR BLOOD PRESSURE SUPPORT)
PREFILLED_SYRINGE | INTRAVENOUS | Status: DC | PRN
  Administered 2020-06-13: 80 ug via INTRAVENOUS

## 2020-06-13 MED ORDER — LIDOCAINE 2% (20 MG/ML) 5 ML SYRINGE
INTRAMUSCULAR | Status: DC | PRN
  Administered 2020-06-13: 60 mg via INTRAVENOUS

## 2020-06-13 MED ORDER — OXYCODONE HCL 5 MG PO TABS: 5.0000 mg | ORAL_TABLET | Freq: Once | ORAL | Status: DC | PRN

## 2020-06-13 MED ORDER — PROPOFOL 10 MG/ML IV BOLUS
INTRAVENOUS | Status: AC
  Filled 2020-06-13: qty 20

## 2020-06-13 MED ORDER — PROPOFOL 10 MG/ML IV BOLUS
INTRAVENOUS | Status: DC | PRN
  Administered 2020-06-13: 140 mg via INTRAVENOUS

## 2020-06-13 MED ORDER — ROCURONIUM BROMIDE 10 MG/ML (PF) SYRINGE
PREFILLED_SYRINGE | INTRAVENOUS | Status: DC | PRN
  Administered 2020-06-13: 70 mg via INTRAVENOUS
  Administered 2020-06-13: 30 mg via INTRAVENOUS

## 2020-06-13 MED ORDER — PHENYLEPHRINE 40 MCG/ML (10ML) SYRINGE FOR IV PUSH (FOR BLOOD PRESSURE SUPPORT)
PREFILLED_SYRINGE | INTRAVENOUS | Status: AC
  Filled 2020-06-13: qty 10

## 2020-06-13 MED ORDER — EPHEDRINE SULFATE-NACL 50-0.9 MG/10ML-% IV SOSY
PREFILLED_SYRINGE | INTRAVENOUS | Status: DC | PRN
  Administered 2020-06-13 (×2): 10 mg via INTRAVENOUS

## 2020-06-13 MED ORDER — SUGAMMADEX SODIUM 200 MG/2ML IV SOLN
INTRAVENOUS | Status: DC | PRN
  Administered 2020-06-13 (×2): 100 mg via INTRAVENOUS

## 2020-06-13 SURGICAL SUPPLY — 46 items
ADH SKN CLS APL DERMABOND .7 (GAUZE/BANDAGES/DRESSINGS) ×1
APL PRP STRL LF DISP 70% ISPRP (MISCELLANEOUS) ×1
APPLIER CLIP LOGIC TI 5 (MISCELLANEOUS) IMPLANT
APR CLP MED LRG 33X5 (MISCELLANEOUS)
BLADE CLIPPER SURG (BLADE) ×2 IMPLANT
CANISTER SUCT 1200ML W/VALVE (MISCELLANEOUS) IMPLANT
CHLORAPREP W/TINT 26 (MISCELLANEOUS) ×2 IMPLANT
COVER WAND RF STERILE (DRAPES) IMPLANT
DERMABOND ADVANCED (GAUZE/BANDAGES/DRESSINGS) ×1
DERMABOND ADVANCED .7 DNX12 (GAUZE/BANDAGES/DRESSINGS) ×1 IMPLANT
DEVICE SECURE STRAP 25 ABSORB (INSTRUMENTS) ×2 IMPLANT
DISSECT BALLN SPACEMKR + OVL (BALLOONS) ×2
DISSECTOR BALLN SPACEMKR + OVL (BALLOONS) ×1 IMPLANT
DISSECTOR BLUNT TIP ENDO 5MM (MISCELLANEOUS) IMPLANT
ELECT REM PT RETURN 9FT ADLT (ELECTROSURGICAL) ×2
ELECTRODE REM PT RTRN 9FT ADLT (ELECTROSURGICAL) ×1 IMPLANT
ENDOLOOP SUT PDS II  0 18 (SUTURE) ×2
ENDOLOOP SUT PDS II 0 18 (SUTURE) ×2 IMPLANT
GLOVE SRG 8 PF TXTR STRL LF DI (GLOVE) ×1 IMPLANT
GLOVE SURG ENC MOIS LTX SZ8 (GLOVE) ×2 IMPLANT
GLOVE SURG PR MICRO ENCORE 7 (GLOVE) ×2 IMPLANT
GLOVE SURG UNDER POLY LF SZ8 (GLOVE) ×2
GOWN STRL REUS W/ TWL LRG LVL3 (GOWN DISPOSABLE) ×1 IMPLANT
GOWN STRL REUS W/ TWL XL LVL3 (GOWN DISPOSABLE) ×1 IMPLANT
GOWN STRL REUS W/TWL LRG LVL3 (GOWN DISPOSABLE) ×2
GOWN STRL REUS W/TWL XL LVL3 (GOWN DISPOSABLE) ×2
MESH 3DMAX 4X6 LT LRG (Mesh General) ×2 IMPLANT
MESH 3DMAX 4X6 RT LRG (Mesh General) ×2 IMPLANT
NS IRRIG 1000ML POUR BTL (IV SOLUTION) ×2 IMPLANT
PACK BASIN DAY SURGERY FS (CUSTOM PROCEDURE TRAY) ×2 IMPLANT
SCISSORS LAP 5X35 DISP (ENDOMECHANICALS) ×2 IMPLANT
SET IRRIG TUBING LAPAROSCOPIC (IRRIGATION / IRRIGATOR) IMPLANT
SET TUBE SMOKE EVAC HIGH FLOW (TUBING) ×2 IMPLANT
SLEEVE SCD COMPRESS KNEE MED (STOCKING) ×2 IMPLANT
SUT MNCRL AB 4-0 PS2 18 (SUTURE) ×2 IMPLANT
SUT VICRYL 0 UR6 27IN ABS (SUTURE) IMPLANT
SYR TOOMEY 50ML (SYRINGE) IMPLANT
TOWEL GREEN STERILE FF (TOWEL DISPOSABLE) ×4 IMPLANT
TRAY FOL W/BAG SLVR 16FR STRL (SET/KITS/TRAYS/PACK) ×1 IMPLANT
TRAY FOLEY W/BAG SLVR 14FR LF (SET/KITS/TRAYS/PACK) ×2 IMPLANT
TRAY FOLEY W/BAG SLVR 16FR LF (SET/KITS/TRAYS/PACK) ×2
TRAY LAPAROSCOPIC (CUSTOM PROCEDURE TRAY) ×2 IMPLANT
TROCAR XCEL BLADELESS 5X75MML (TROCAR) ×2 IMPLANT
TROCAR XCEL NON-BLD 5MMX100MML (ENDOMECHANICALS) IMPLANT
TUBE CONNECTING 20X1/4 (TUBING) ×2 IMPLANT
YANKAUER SUCT BULB TIP NO VENT (SUCTIONS) ×2 IMPLANT

## 2020-06-13 NOTE — Anesthesia Preprocedure Evaluation (Addendum)
Anesthesia Evaluation  Patient identified by MRN, date of birth, ID band Patient awake    Reviewed: Allergy & Precautions, NPO status , Patient's Chart, lab work & pertinent test results  Airway Mallampati: II  TM Distance: >3 FB Neck ROM: Full    Dental  (+) Dental Advisory Given   Pulmonary neg pulmonary ROS,    breath sounds clear to auscultation       Cardiovascular hypertension, Pt. on medications and Pt. on home beta blockers + DOE   Rhythm:Regular Rate:Normal     Neuro/Psych negative neurological ROS     GI/Hepatic Neg liver ROS, GERD  ,  Endo/Other  negative endocrine ROS  Renal/GU negative Renal ROS     Musculoskeletal   Abdominal   Peds  Hematology negative hematology ROS (+)   Anesthesia Other Findings   Reproductive/Obstetrics                             Lab Results  Component Value Date   WBC 5.4 06/11/2020   HGB 13.3 06/11/2020   HCT 37.9 (L) 06/11/2020   MCV 90.2 06/11/2020   PLT 275 06/11/2020   Lab Results  Component Value Date   CREATININE 1.02 06/11/2020   BUN 15 06/11/2020   NA 139 06/11/2020   K 5.2 (H) 06/11/2020   CL 103 06/11/2020   CO2 28 06/11/2020    Anesthesia Physical  Anesthesia Plan  ASA: II  Anesthesia Plan: General   Post-op Pain Management:  Regional for Post-op pain   Induction: Intravenous  PONV Risk Score and Plan: 2 and Ondansetron, Treatment may vary due to age or medical condition and Midazolam  Airway Management Planned: Oral ETT  Additional Equipment:   Intra-op Plan:   Post-operative Plan: Extubation in OR  Informed Consent: I have reviewed the patients History and Physical, chart, labs and discussed the procedure including the risks, benefits and alternatives for the proposed anesthesia with the patient or authorized representative who has indicated his/her understanding and acceptance.     Dental advisory  given  Plan Discussed with: CRNA  Anesthesia Plan Comments:        Anesthesia Quick Evaluation

## 2020-06-13 NOTE — Progress Notes (Signed)
Attempted to insert 14 fr. Foley catheter on patient using sterile technique, resistance met, no urine backflash. Dr. Brantley Stage notified and  Aware. Per MD, patient okay to discharge to home. Dr. Brantley Stage spoke with patient and spouse.

## 2020-06-13 NOTE — Anesthesia Postprocedure Evaluation (Signed)
Anesthesia Post Note  Patient: Anthony Collins  Procedure(s) Performed: LAPAROSCOPIC RIGHT INGUINAL HERNIA REPAIR WITH MESH (Bilateral Groin)     Patient location during evaluation: PACU Anesthesia Type: General Level of consciousness: awake and alert Pain management: pain level controlled Vital Signs Assessment: post-procedure vital signs reviewed and stable Respiratory status: spontaneous breathing, nonlabored ventilation and respiratory function stable Cardiovascular status: blood pressure returned to baseline and stable Postop Assessment: no apparent nausea or vomiting Anesthetic complications: no   No complications documented.  Last Vitals:  Vitals:   06/13/20 1345 06/13/20 1420  BP: 115/65 103/60  Pulse: 80 85  Resp: 19 18  Temp:  36.4 C  SpO2: 97% 98%    Last Pain:  Vitals:   06/13/20 1420  TempSrc:   PainSc: 0-No pain                 Lynda Rainwater

## 2020-06-13 NOTE — Progress Notes (Signed)
Bladder scan X 3= 3ml in bladder. No fullness noted over pubic bone.

## 2020-06-13 NOTE — Discharge Instructions (Signed)
[facility] _______[facility]  UMBILICAL OR INGUINAL HERNIA REPAIR: POST OP INSTRUCTIONS  Always review your discharge instruction sheet given to you by the facility where your surgery was performed. IF YOU HAVE DISABILITY OR FAMILY LEAVE FORMS, YOU MUST BRING THEM TO THE OFFICE FOR PROCESSING.   DO NOT GIVE THEM TO YOUR DOCTOR.  1. A  prescription for pain medication may be given to you upon discharge.  Take your pain medication as prescribed, if needed.  If narcotic pain medicine is not needed, then you may take acetaminophen (Tylenol) or ibuprofen (Advil) as needed. 2. Take your usually prescribed medications unless otherwise directed. If you need a refill on your pain medication, please contact your pharmacy.  They will contact our office to request authorization. Prescriptions will not be filled after 5 pm or on week-ends. 3. You should follow a light diet the first 24 hours after arrival home, such as soup and crackers, etc.  Be sure to include lots of fluids daily.  Resume your normal diet the day after surgery. 4.Most patients will experience some swelling and bruising around the umbilicus or in the groin and scrotum.  Ice packs and reclining will help.  Swelling and bruising can take several days to resolve.  6. It is common to experience some constipation if taking pain medication after surgery.  Increasing fluid intake and taking a stool softener (such as Colace) will usually help or prevent this problem from occurring.  A mild laxative (Milk of Magnesia or Miralax) should be taken according to package directions if there are no bowel movements after 48 hours. 7. Unless discharge instructions indicate otherwise, you may remove your bandages 24-48 hours after surgery, and you may shower at that time.  You may have steri-strips (small skin tapes) in place directly over the incision.  These strips should be left on the skin for 7-10 days.  If your surgeon used skin glue on the  incision, you may shower in 24 hours.  The glue will flake off over the next 2-3 weeks.  Any sutures or staples will be removed at the office during your follow-up visit. 8. ACTIVITIES:  You may resume regular (light) daily activities beginning the next day--such as daily self-care, walking, climbing stairs--gradually increasing activities as tolerated.  You may have sexual intercourse when it is comfortable.  Refrain from any heavy lifting or straining until approved by your doctor.  a.You may drive when you are no longer taking prescription pain medication, you can comfortably wear a seatbelt, and you can safely maneuver your car and apply brakes. b.RETURN TO WORK:   _____________________________________________  9.You should see your doctor in the office for a follow-up appointment approximately 2-3 weeks after your surgery.  Make sure that you call for this appointment within a day or two after you arrive home to insure a convenient appointment time. 10.OTHER INSTRUCTIONS: _________________________    _____________________________________  WHEN TO CALL YOUR DOCTOR: 1. Fever over 101.0 2. Inability to urinate 3. Nausea and/or vomiting 4. Extreme swelling or bruising 5. Continued bleeding from incision. 6. Increased pain, redness, or drainage from the incision  The clinic staff is available to answer your questions during regular business hours.  Please don't hesitate to call and ask to speak to one of the nurses for clinical concerns.  If you have a medical emergency, go to the nearest emergency room or call 911.  A surgeon from [facility] is always on call at the hospital  Expect  scrotum to be swollen for 2 weeks      [address], [place], [place], [place]  [address] ?  [address], [place], [place]   [address] [phone] ? [phone] ? FAX [phone] Web site: [email or web address]   Post Anesthesia Home Care Instructions  Activity: Get  plenty of rest for the remainder of the day. A responsible individual must stay with you for 24 hours following the procedure.  For the next 24 hours, DO NOT: -Drive a car -Paediatric nurse -Drink alcoholic beverages -Take any medication unless instructed by your physician -Make any legal decisions or sign important papers.  Meals: Start with liquid foods such as gelatin or soup. Progress to regular foods as tolerated. Avoid greasy, spicy, heavy foods. If nausea and/or vomiting occur, drink only clear liquids until the nausea and/or vomiting subsides. Call your physician if vomiting continues.  Special Instructions/Symptoms: Your throat may feel dry or sore from the anesthesia or the breathing tube placed in your throat during surgery. If this causes discomfort, gargle with warm salt water. The discomfort should disappear within 24 hours.  If you had a scopolamine patch placed behind your ear for the management of post- operative nausea and/or vomiting:  1. The medication in the patch is effective for 72 hours, after which it should be removed.  Wrap patch in a tissue and discard in the trash. Wash hands thoroughly with soap and water. 2. You may remove the patch earlier than 72 hours if you experience unpleasant side effects which may include dry mouth, dizziness or visual disturbances. 3. Avoid touching the patch. Wash your hands with soap and water after contact with the patch.

## 2020-06-13 NOTE — Op Note (Signed)
Preoperative diagnosis: Recurrent right inguinal hernia  Postoperative diagnosis: Recurrent bilateral inguinal hernia     procedure: Laparoscopic preperitoneal repair of bilateral inguinal hernia using Bard 3D mesh.   Surgeon: Erroll Luna, MD  Anesthesia: General with right tap block and 0.25% Marcaine plain  EBL: Minimal  Specimen: None  Drains: None  Indications for procedure: The patient is a pleasant 75 year old male who had a previous history of bilateral inguinal hernia repair in the past with mesh.  He presents with a recurrence on the right.  He also complained of pain on the left but no obvious signs of hernia on exam left.  After the laparoscopic approach to repair his right inguinal hernia with mesh but also to be able to look at his left side to see if that had recurred as well.  I discussed open repair as well.  Pros and cons of laparoscopic versus open repair were discussed.  Complications of bleeding, infection, bladder injury, bowel injury, bowel obstruction, injury to spermatic cord structures, testicle loss, recurrence, injury to major vascular structures, the need for recurrent and revisional surgery were all discussed in the circumstance.  Talked about chronic pain afterwards with the use of mesh.  Talked about using tacks and/or glue to secure the mesh.  Discussed an open approach as well to these.  After discussion of the above he opted to proceed with laparoscopic right possible bilateral inguinal hernia pair with mesh.  Description of procedure: The patient was met in the holding area and questions were answered.  He was then taken back to the operative room.  He was placed supine upon the OR table.  After induction of general esthesia both arms were tucked and a Foley catheter was placed without difficulty under sterile conditions.  He was then prepped and draped in sterile fashion.  Timeout was done.  Local anesthetic was infiltrated along the base the umbilicus.   Vertical incision was made in the right rectus muscle was identified.  The anterior sheath was opened and the muscle was swept laterally and pulled anteriorly to expose the posterior space.  I placed my finger in it and it went down easily into the preperitoneal space without resistance.  A balloon dissector was then placed carefully with the tip abutting against the pubic symphysis.  We then removed the inner cannula and placed the scope through and insufflated the space under direct vision without difficulty.  This was held for a minute.  It was then deinsufflated and the balloon was pulled out and removed.  We then insufflated the space to 15 mmHg of CO2 CO2 pressure.  Laparoscope was placed.  The preperitoneum was intact.  I placed two 5 mm ports in the midline and direct vision.  On the left side a recurrent hernia was noted.  There was a previous hernia mesh plug noted and this was quite loose.  There is also an indirect sac on the left.  On the right there is a large indirect sac.  I was able then to dissect the indirect sac off the right cord structures first.  This was taken all the way back to the base the peritoneum.  An Endoloop was used and placed around the sac to ligate it under direct vision taking care not to ensnare or entrap any other structures.  This was done under direct vision.  Once this was dissected out I exposed the pubic symphysis and Cooper's ligament on the side.  Attention was then turned to the left  side a similar fashion I dissected out the previous hernia mesh plug that was noted in a direct defect.  This was quite loose a look like it was recurring.  He also had an indirect sac along the left cord structures which I carefully dissected away down to the base.  This was smaller though.  An Endoloop was placed around this.  This was done direct vision making sure not to ensnare any internal structures.  Once he was fully exposed on both sides I introduced a Bard left 3D polypropylene  mesh in place in the left side.  The marker medial was placed in the medial side at the pubis.  This laid out quite nicely to cover both direct and indirect defect.  A single tack was placed into the pubis to hold this mesh in the place otherwise to cover the defect with overlap.  A right Bard 3D mesh was then inserted on the right side.  This was fully deployed.  A single tack was used to hold it in place as well in a similar fashion.  This was placed to cover the defect quite nicely and the hernia sac was well behind this.  It laid out very nicely in the space.  There is no signs of any excessive bleeding.  Cord structures looked intact as well as the inferior epigastric vessels bilaterally.  The bladder appeared intact without any evidence of injury and was well out of our operative field with Foley catheter placement.  At this point time a deinsufflate is based slowly.  The mesh stayed in position quite nicely.  I then removed my ports.  The fascia was closed with 0 Vicryl.  4 Monocryl was used to close the skin incisions once all the CO2 was allowed to escape.  Dermabond applied.  Foley catheter removed.  All counts were found to be correct.  There is no blood in the urine of note.  The was awoke extubated taken to recovery in satisfactory condition. 

## 2020-06-13 NOTE — Anesthesia Procedure Notes (Signed)
Anesthesia Regional Block: TAP block   Pre-Anesthetic Checklist: ,, timeout performed, Correct Patient, Correct Site, Correct Laterality, Correct Procedure, Correct Position, site marked, Risks and benefits discussed,  Surgical consent,  Pre-op evaluation,  At surgeon's request and post-op pain management  Laterality: Right  Prep: chloraprep       Needles:  Injection technique: Single-shot  Needle Type: Stimiplex     Needle Length: 9cm  Needle Gauge: 21     Additional Needles:   Procedures:,,,, ultrasound used (permanent image in chart),,,,  Narrative:  Start time: 06/13/2020 9:57 AM End time: 06/13/2020 10:02 AM Injection made incrementally with aspirations every 5 mL.  Performed by: Personally  Anesthesiologist: Lynda Rainwater, MD

## 2020-06-13 NOTE — Progress Notes (Signed)
Assisted Dr. Sabra Heck with right, ultrasound guided, transabdominal plane block. Side rails up, monitors on throughout procedure. See vital signs in flow sheet. Tolerated Procedure well.

## 2020-06-13 NOTE — Transfer of Care (Signed)
Immediate Anesthesia Transfer of Care Note  Patient: Anthony Collins  Procedure(s) Performed: LAPAROSCOPIC RIGHT INGUINAL HERNIA REPAIR WITH MESH (Bilateral Groin)  Patient Location: PACU  Anesthesia Type:General  Level of Consciousness: drowsy and patient cooperative  Airway & Oxygen Therapy: Patient Spontanous Breathing and Patient connected to face mask oxygen  Post-op Assessment: Report given to RN and Post -op Vital signs reviewed and stable  Post vital signs: Reviewed and stable  Last Vitals:  Vitals Value Taken Time  BP 118/60   Temp    Pulse 81   Resp 18   SpO2 100     Last Pain:  Vitals:   06/13/20 0912  TempSrc: Oral  PainSc: 0-No pain      Patients Stated Pain Goal: 1 (13/14/38 8875)  Complications: No complications documented.

## 2020-06-13 NOTE — Interval H&P Note (Signed)
History and Physical Interval Note:  06/13/2020 10:55 AM  Anthony Collins  has presented today for surgery, with the diagnosis of RECURRENT RIGHT INGUINAL HERNIA.  The various methods of treatment have been discussed with the patient and family. After consideration of risks, benefits and other options for treatment, the patient has consented to  Procedure(s) with comments: LAPAROSCOPIC RIGHT INGUINAL HERNIA REPAIR WITH MESH POSSIBLE OPEN (Right) - ROOM 8 STARTING AT 11:00AM FOR 120 MIN as a surgical intervention.  The patient's history has been reviewed, patient examined, no change in status, stable for surgery.  I have reviewed the patient's chart and labs.  Questions were answered to the patient's satisfaction.   The risk of hernia repair include bleeding,  Infection,   Recurrence of the hernia,  Mesh use, chronic pain,  Organ injury,  Bowel injury,  Bladder injury,   nerve injury with numbness around the incision,  Death,  and worsening of preexisting  medical problems.  The alternatives to surgery have been discussed as well..  Long term expectations of both operative and non operative treatments have been discussed.   Discussed bladder injury, nerve  injury need for open surgery, recurrence , bowel injury/obstruction,  urethra injury, and need for other procedures / operations. The patient agrees to proceed.    Turner Daniels MD

## 2020-06-13 NOTE — Anesthesia Procedure Notes (Signed)
Procedure Name: Intubation Date/Time: 06/13/2020 11:51 AM Performed by: Kathryne Hitch, CRNA Pre-anesthesia Checklist: Patient identified, Emergency Drugs available, Suction available and Patient being monitored Patient Re-evaluated:Patient Re-evaluated prior to induction Oxygen Delivery Method: Circle system utilized Preoxygenation: Pre-oxygenation with 100% oxygen Induction Type: IV induction Ventilation: Mask ventilation without difficulty Laryngoscope Size: Mac and 4 Grade View: Grade II Tube type: Oral Tube size: 7.5 mm Number of attempts: 2 Airway Equipment and Method: Stylet and Oral airway Placement Confirmation: ETT inserted through vocal cords under direct vision,  positive ETCO2 and breath sounds checked- equal and bilateral Secured at: 23 cm Tube secured with: Tape Dental Injury: Teeth and Oropharynx as per pre-operative assessment

## 2020-06-14 ENCOUNTER — Encounter (HOSPITAL_BASED_OUTPATIENT_CLINIC_OR_DEPARTMENT_OTHER): Payer: Self-pay | Admitting: Surgery

## 2020-06-14 NOTE — Addendum Note (Signed)
Addendum  created 06/14/20 1154 by Tawni Millers, CRNA   Charge Capture section accepted

## 2020-06-16 ENCOUNTER — Encounter: Payer: Self-pay | Admitting: Internal Medicine

## 2020-06-16 MED ORDER — ROSUVASTATIN CALCIUM 5 MG PO TABS: 5.0000 mg | ORAL_TABLET | Freq: Every day | ORAL | 1 refills | Status: DC

## 2020-07-05 ENCOUNTER — Telehealth: Payer: PPO

## 2020-07-16 ENCOUNTER — Ambulatory Visit (INDEPENDENT_AMBULATORY_CARE_PROVIDER_SITE_OTHER)
Admission: RE | Admit: 2020-07-16 | Discharge: 2020-07-16 | Disposition: A | Discharge status: Home / Self Care | Payer: Self-pay | Source: Ambulatory Visit | Attending: Internal Medicine | Admitting: Internal Medicine

## 2020-07-16 ENCOUNTER — Other Ambulatory Visit: Payer: Self-pay

## 2020-07-16 DIAGNOSIS — R0609 Other forms of dyspnea: Secondary | ICD-10-CM

## 2020-07-16 DIAGNOSIS — R0600 Dyspnea, unspecified: Secondary | ICD-10-CM

## 2020-12-11 ENCOUNTER — Other Ambulatory Visit: Payer: Self-pay

## 2020-12-12 ENCOUNTER — Ambulatory Visit (INDEPENDENT_AMBULATORY_CARE_PROVIDER_SITE_OTHER): Payer: PPO | Admitting: Internal Medicine

## 2020-12-12 ENCOUNTER — Encounter: Payer: Self-pay | Admitting: Internal Medicine

## 2020-12-12 VITALS — BP 146/78 | HR 77 | Temp 97.8°F | Resp 16 | Ht 68.0 in | Wt 156.0 lb

## 2020-12-12 DIAGNOSIS — I10 Essential (primary) hypertension: Secondary | ICD-10-CM

## 2020-12-12 DIAGNOSIS — E785 Hyperlipidemia, unspecified: Secondary | ICD-10-CM

## 2020-12-12 DIAGNOSIS — D539 Nutritional anemia, unspecified: Secondary | ICD-10-CM | POA: Diagnosis not present

## 2020-12-12 DIAGNOSIS — K219 Gastro-esophageal reflux disease without esophagitis: Secondary | ICD-10-CM

## 2020-12-12 DIAGNOSIS — L309 Dermatitis, unspecified: Secondary | ICD-10-CM | POA: Diagnosis not present

## 2020-12-12 DIAGNOSIS — N400 Enlarged prostate without lower urinary tract symptoms: Secondary | ICD-10-CM | POA: Diagnosis not present

## 2020-12-12 LAB — VITAMIN B12: Vitamin B-12: >1550 pg/mL — ABNORMAL HIGH (ref 211–911)

## 2020-12-12 LAB — CBC WITH DIFFERENTIAL/PLATELET
Basophils Absolute: 0 10*3/uL (ref 0.0–0.1)
Basophils Relative: 0.4 % (ref 0.0–3.0)
Eosinophils Absolute: 0.1 10*3/uL (ref 0.0–0.7)
Eosinophils Relative: 1.7 % (ref 0.0–5.0)
HCT: 41.9 % (ref 39.0–52.0)
Hemoglobin: 14.2 g/dL (ref 13.0–17.0)
Lymphocytes Relative: 17.3 % (ref 12.0–46.0)
Lymphs Abs: 0.9 10*3/uL (ref 0.7–4.0)
MCHC: 34 g/dL (ref 30.0–36.0)
MCV: 94.7 fl (ref 78.0–100.0)
Monocytes Absolute: 0.4 10*3/uL (ref 0.1–1.0)
Monocytes Relative: 7.6 % (ref 3.0–12.0)
Neutro Abs: 3.6 10*3/uL (ref 1.4–7.7)
Neutrophils Relative %: 73 % (ref 43.0–77.0)
Platelets: 262 10*3/uL (ref 150.0–400.0)
RBC: 4.42 Mil/uL (ref 4.22–5.81)
RDW: 12.9 % (ref 11.5–15.5)
WBC: 4.9 10*3/uL (ref 4.0–10.5)

## 2020-12-12 LAB — BASIC METABOLIC PANEL
BUN: 20 mg/dL (ref 6–23)
CO2: 29 mEq/L (ref 19–32)
Calcium: 9.5 mg/dL (ref 8.4–10.5)
Chloride: 104 mEq/L (ref 96–112)
Creatinine, Ser: 0.96 mg/dL (ref 0.40–1.50)
GFR: 77.66 mL/min (ref >60.00)
Glucose, Bld: 101 mg/dL — ABNORMAL HIGH (ref 70–99)
Potassium: 4.5 mEq/L (ref 3.5–5.1)
Sodium: 139 mEq/L (ref 135–145)

## 2020-12-12 LAB — URINALYSIS, ROUTINE W REFLEX MICROSCOPIC
Bilirubin Urine: NEGATIVE
Hgb urine dipstick: NEGATIVE
Leukocytes,Ua: NEGATIVE
Nitrite: NEGATIVE
Specific Gravity, Urine: 1.01 (ref 1.000–1.030)
Total Protein, Urine: NEGATIVE
Urine Glucose: NEGATIVE
Urobilinogen, UA: 0.2 — AB (ref 0.0–1.0)
pH: 5.5 (ref 5.0–8.0)

## 2020-12-12 LAB — IBC + FERRITIN
Ferritin: 184.4 ng/mL (ref 22.0–322.0)
Iron: 166 ug/dL — ABNORMAL HIGH (ref 42–165)
Saturation Ratios: 59.6 % — ABNORMAL HIGH (ref 20.0–50.0)
TIBC: 278.6 ug/dL (ref 250.0–450.0)
Transferrin: 199 mg/dL — ABNORMAL LOW (ref 212.0–360.0)

## 2020-12-12 LAB — TSH: TSH: 2.76 u[IU]/mL (ref 0.35–5.50)

## 2020-12-12 LAB — FOLATE: Folate: 19.1 ng/mL (ref >5.9)

## 2020-12-12 MED ORDER — OMEPRAZOLE 40 MG PO CPDR: 40.0000 mg | DELAYED_RELEASE_CAPSULE | Freq: Every day | ORAL | 3 refills | Status: DC | PRN

## 2020-12-12 MED ORDER — FINASTERIDE 5 MG PO TABS
5.0000 mg | ORAL_TABLET | Freq: Every day | ORAL | 1 refills | Status: DC
Start: 2020-12-12 — End: 2021-06-12

## 2020-12-12 MED ORDER — TAMSULOSIN HCL 0.4 MG PO CAPS: 0.4000 mg | ORAL_CAPSULE | Freq: Every day | ORAL | 1 refills | Status: DC

## 2020-12-12 MED ORDER — CLOTRIMAZOLE-BETAMETHASONE 1-0.05 % EX CREA: 1.0000 "application " | TOPICAL_CREAM | Freq: Two times a day (BID) | CUTANEOUS | 3 refills | Status: DC

## 2020-12-12 MED ORDER — IRBESARTAN 150 MG PO TABS: 150.0000 mg | ORAL_TABLET | Freq: Every day | ORAL | 1 refills | Status: DC

## 2020-12-12 MED ORDER — METOPROLOL SUCCINATE ER 25 MG PO TB24: 25.0000 mg | ORAL_TABLET | Freq: Every evening | ORAL | 1 refills | Status: DC

## 2020-12-12 NOTE — Patient Instructions (Signed)

## 2020-12-12 NOTE — Progress Notes (Signed)
Subjective:  Patient ID: Anthony Collins, male    DOB: 04/12/1945  Age: 75 y.o. MRN: [id number]  CC: Hypertension, Hyperlipidemia, and Anemia  This visit occurred during the SARS-CoV-2 public health emergency.  Safety protocols were in place, including screening questions prior to the visit, additional usage of staff PPE, and extensive cleaning of exam room while observing appropriate contact time as indicated for disinfecting solutions.    HPI Anthony Collins presents for f/up - He walks about 4 miles a day.  He does not experience chest pain, shortness of breath, diaphoresis, dizziness, lightheadedness, or palpitations.  Outpatient Medications Prior to Visit  Medication Sig Dispense Refill  . Bacillus Coagulans-Inulin (PROBIOTIC FORMULA PO) Take by mouth.    . Cholecalciferol (VITAMIN D3) 1000 UNITS CAPS Take 1 capsule by mouth daily.    . cyanocobalamin 1000 MCG tablet Take 1,000 mcg by mouth daily.    . Lutein-Zeaxanthin 20-1 MG CAPS Take 1 capsule by mouth daily.    . clotrimazole-betamethasone (LOTRISONE) cream Apply 1 application topically 2 (two) times daily. 45 g 3  . finasteride (PROSCAR) 5 MG tablet Take 1 tablet (5 mg total) by mouth daily. 90 tablet 1  . ibuprofen (ADVIL) 800 MG tablet Take 1 tablet (800 mg total) by mouth every 8 (eight) hours as needed. 30 tablet 0  . irbesartan (AVAPRO) 150 MG tablet Take 1 tablet (150 mg total) by mouth daily. 90 tablet 1  . metoprolol succinate (TOPROL-XL) 25 MG 24 hr tablet Take 1 tablet (25 mg total) by mouth every evening. 90 tablet 1  . Multiple Vitamin (MULTIVITAMIN WITH MINERALS) TABS tablet Take 1 tablet by mouth daily.    Marland Kitchen omeprazole (PRILOSEC) 40 MG capsule Take 1 capsule (40 mg total) by mouth daily as needed (heart burn). 90 capsule 3  . oxyCODONE (OXY IR/ROXICODONE) 5 MG immediate release tablet Take 1 tablet (5 mg total) by mouth every 6 (six) hours as needed for severe pain. 15 tablet 0  . rosuvastatin (CRESTOR) 5 MG  tablet Take 1 tablet (5 mg total) by mouth daily. 90 tablet 1  . tamsulosin (FLOMAX) 0.4 MG CAPS capsule Take 1 capsule (0.4 mg total) by mouth daily. 90 capsule 1   No facility-administered medications prior to visit.    ROS Review of Systems  Constitutional:  Negative for appetite change, diaphoresis and fatigue.  HENT: Negative.    Eyes: Negative.   Respiratory:  Negative for cough and shortness of breath.   Cardiovascular:  Negative for chest pain, palpitations and leg swelling.  Gastrointestinal:  Negative for abdominal pain, constipation, diarrhea, nausea and vomiting.  Endocrine: Negative.   Genitourinary: Negative.   Musculoskeletal: Negative.  Negative for arthralgias and myalgias.  Skin: Negative.  Negative for color change.  Neurological:  Negative for dizziness, weakness and light-headedness.  Hematological:  Negative for adenopathy. Does not bruise/bleed easily.  Psychiatric/Behavioral: Negative.     Objective:  BP (!) 146/78 (BP Location: Left Arm, Patient Position: Sitting, Cuff Size: Large)   Pulse 77   Temp 97.8 F (36.6 C) (Oral)   Resp 16   Ht 5\' 8"  (1.727 m)   Wt 156 lb (70.8 kg)   SpO2 97%   BMI 23.72 kg/m   BP Readings from Last 3 Encounters:  12/12/20 (!) 146/78  06/13/20 103/60  06/11/20 (!) 158/84    Wt Readings from Last 3 Encounters:  12/12/20 156 lb (70.8 kg)  06/13/20 154 lb 15.7 oz (70.3 kg)  06/11/20 159 lb (  72.1 kg)    Physical Exam Vitals reviewed.  Constitutional:      Appearance: Normal appearance.  HENT:     Nose: Nose normal.     Mouth/Throat:     Mouth: Mucous membranes are moist.  Eyes:     Conjunctiva/sclera: Conjunctivae normal.  Cardiovascular:     Rate and Rhythm: Normal rate and regular rhythm.     Heart sounds: No murmur heard. Pulmonary:     Effort: Pulmonary effort is normal.     Breath sounds: No stridor. No wheezing, rhonchi or rales.  Abdominal:     General: Abdomen is flat. Bowel sounds are normal. There  is no distension.     Palpations: Abdomen is soft. There is no hepatomegaly, splenomegaly or mass.     Tenderness: There is no abdominal tenderness. There is no guarding.  Musculoskeletal:        General: Normal range of motion.     Cervical back: Neck supple.     Right lower leg: No edema.     Left lower leg: No edema.  Lymphadenopathy:     Cervical: No cervical adenopathy.  Skin:    General: Skin is warm and dry.  Neurological:     General: No focal deficit present.     Mental Status: He is alert.  Psychiatric:        Mood and Affect: Mood normal.        Behavior: Behavior normal.    Lab Results  Component Value Date   WBC 4.9 12/12/2020   HGB 14.2 12/12/2020   HCT 41.9 12/12/2020   PLT 262.0 12/12/2020   GLUCOSE 101 (H) 12/12/2020   CHOL 157 06/11/2020   TRIG 92.0 06/11/2020   HDL 59.60 06/11/2020   LDLCALC 79 06/11/2020   ALT 24 06/11/2020   AST 25 06/11/2020   NA 139 12/12/2020   K 4.5 12/12/2020   CL 104 12/12/2020   CREATININE 0.96 12/12/2020   BUN 20 12/12/2020   CO2 29 12/12/2020   TSH 2.76 12/12/2020   PSA 0.99 06/11/2020   HGBA1C 5.2 01/13/2017    CT CARDIAC SCORING (SELF PAY ONLY)  Addendum Date: 07/16/2020   ADDENDUM REPORT: 07/16/2020 12:37 CLINICAL DATA:  Cardiovascular Disease Risk stratification EXAM: Coronary Calcium Score TECHNIQUE: A gated, non-contrast computed tomography scan of the heart was performed using 37mm slice thickness. Axial images were analyzed on a dedicated workstation. Calcium scoring of the coronary arteries was performed using the Agatston method. FINDINGS: Coronary Calcium Score: Left main: 0 Left anterior descending artery: 0 Left circumflex artery: 0 Right coronary artery: 0 Total: 0 Percentile: NA Pericardium: Normal. Ascending Aorta: Normal caliber. Non-cardiac: See separate report from [facility]. IMPRESSION: Coronary calcium score of 0. RECOMMENDATIONS: Coronary artery calcium (CAC) score is a strong predictor of  incident coronary heart disease (CHD) and provides predictive information beyond traditional risk factors. CAC scoring is reasonable to use in the decision to withhold, postpone, or initiate statin therapy in intermediate-risk or selected borderline-risk asymptomatic adults (age 46-75 years and LDL-C >=70 to <190 mg/dL) who do not have diabetes or established atherosclerotic cardiovascular disease (ASCVD).* In intermediate-risk (10-year ASCVD risk >=7.5% to <20%) adults or selected borderline-risk (10-year ASCVD risk >=5% to <7.5%) adults in whom a CAC score is measured for the purpose of making a treatment decision the following recommendations have been made: If CAC=0, it is reasonable to withhold statin therapy and reassess in 5 to 10 years, as long as higher risk conditions are  absent (diabetes mellitus, family history of premature CHD in first degree relatives (males <55 years; females <65 years), cigarette smoking, or LDL >=190 mg/dL). If CAC is 1 to 99, it is reasonable to initiate statin therapy for patients >=66 years of age. If CAC is >=100 or >=75th percentile, it is reasonable to initiate statin therapy at any age. Cardiology referral should be considered for patients with CAC scores >=400 or >=75th percentile. *2018 AHA/ACC/AACVPR/AAPA/ABC/ACPM/ADA/AGS/APhA/ASPC/NLA/PCNA Guideline on the Management of Blood Cholesterol: A Report of the American College of Cardiology/American Heart Association Task Force on Clinical Practice Guidelines. J Am Coll Cardiol. 2019;73(24):3168-3209. Eleonore Chiquito, MD Electronically Signed   By: Eleonore Chiquito   On: 07/16/2020 12:37   Result Date: 07/16/2020 EXAM: OVER-READ INTERPRETATION  CT CHEST The following report is an over-read performed by radiologist Dr. Vinnie Langton of [facility], [place] on 07/16/2020. This over-read does not include interpretation of cardiac or coronary anatomy or pathology. The coronary calcium score interpretation by the cardiologist is  attached. COMPARISON:  None. FINDINGS: 3 mm right lower lobe pulmonary nodule (axial image 7 of series 3). Within the visualized portions of the thorax there are no other larger more suspicious appearing pulmonary nodules or masses, there is no acute consolidative airspace disease, no pleural effusions, no pneumothorax and no lymphadenopathy. Visualized portions of the upper abdomen are unremarkable. There are no aggressive appearing lytic or blastic lesions noted in the visualized portions of the skeleton. IMPRESSION: 1. 3 mm right lower lobe pulmonary nodule, nonspecific, but statistically likely benign. No follow-up needed if patient is low-risk. Non-contrast chest CT can be considered in 12 months if patient is high-risk. This recommendation follows the consensus statement: Guidelines for Management of Incidental Pulmonary Nodules Detected on CT Images: From the Fleischner Society 2017; Radiology 2017; 284:228-243. Electronically Signed: By: Vinnie Langton M.D. On: 07/16/2020 10:08    Assessment & Plan:   Xiong was seen today for hypertension, hyperlipidemia and anemia.  Diagnoses and all orders for this visit:  Essential hypertension- His blood pressure is not adequately well controlled.  Will continue the current agents and he will continue working on his lifestyle modifications. -     Basic metabolic panel; Future -     irbesartan (AVAPRO) 150 MG tablet; Take 1 tablet (150 mg total) by mouth daily. -     metoprolol succinate (TOPROL-XL) 25 MG 24 hr tablet; Take 1 tablet (25 mg total) by mouth every evening. 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  I will screen him for vitamin deficiencies. -     CBC with Differential/Platelet; Future -     Vitamin B12; Future -     IBC + Ferritin; Future -     Vitamin B1; Future -      Folate; Future -     Folate -     Vitamin B1 -     IBC + Ferritin -     Vitamin B12 -     CBC with Differential/Platelet  Perianal dermatitis -     clotrimazole-betamethasone (LOTRISONE) cream; Apply 1 application topically 2 (two) times daily.  Benign prostatic hyperplasia without lower urinary tract symptoms -     finasteride (PROSCAR) 5 MG tablet; Take 1 tablet (5 mg total)  by mouth daily. -     tamsulosin (FLOMAX) 0.4 MG CAPS capsule; Take 1 capsule (0.4 mg total) by mouth daily. -     Urinalysis, Routine w reflex microscopic; Future -     Urinalysis, Routine w reflex microscopic  Gastroesophageal reflux disease without esophagitis -     omeprazole (PRILOSEC) 40 MG capsule; Take 1 capsule (40 mg total) by mouth daily as needed (heart burn).  Hyperlipidemia with target LDL less than 100- LDL goal achieved. Doing well on the statin  -     TSH; Future -     TSH  I have discontinued [place]. Hasegawa's multivitamin with minerals, oxyCODONE, ibuprofen, and rosuvastatin. I am also having him maintain his Vitamin D3, Bacillus Coagulans-Inulin (PROBIOTIC FORMULA PO), cyanocobalamin, Lutein-Zeaxanthin, clotrimazole-betamethasone, finasteride, irbesartan, metoprolol succinate, omeprazole, and tamsulosin.  Meds ordered this encounter  Medications  . clotrimazole-betamethasone (LOTRISONE) cream    Sig: Apply 1 application topically 2 (two) times daily.    Dispense:  45 g    Refill:  3  . finasteride (PROSCAR) 5 MG tablet    Sig: Take 1 tablet (5 mg total) by mouth daily.    Dispense:  90 tablet    Refill:  1  . irbesartan (AVAPRO) 150 MG tablet    Sig: Take 1 tablet (150 mg total) by mouth daily.    Dispense:  90 tablet    Refill:  1  . metoprolol succinate (TOPROL-XL) 25 MG 24 hr tablet    Sig: Take 1 tablet (25 mg total) by mouth every evening.    Dispense:  90 tablet    Refill:  1  . omeprazole (PRILOSEC) 40 MG capsule    Sig: Take 1 capsule (40 mg total) by mouth daily as  needed (heart burn).    Dispense:  90 capsule    Refill:  3  . tamsulosin (FLOMAX) 0.4 MG CAPS capsule    Sig: Take 1 capsule (0.4 mg total) by mouth daily.    Dispense:  90 capsule    Refill:  1      Follow-up: Return in about 6 months (around 06/11/2021).  Scarlette Calico, MD

## 2020-12-17 LAB — VITAMIN B1: Vitamin B1 (Thiamine): 8 nmol/L (ref 8–30)

## 2020-12-20 ENCOUNTER — Other Ambulatory Visit: Payer: Self-pay | Admitting: Internal Medicine

## 2020-12-20 DIAGNOSIS — E785 Hyperlipidemia, unspecified: Secondary | ICD-10-CM

## 2020-12-25 DIAGNOSIS — Z961 Presence of intraocular lens: Secondary | ICD-10-CM | POA: Diagnosis not present

## 2020-12-26 DIAGNOSIS — L812 Freckles: Secondary | ICD-10-CM | POA: Diagnosis not present

## 2020-12-26 DIAGNOSIS — L821 Other seborrheic keratosis: Secondary | ICD-10-CM | POA: Diagnosis not present

## 2020-12-26 DIAGNOSIS — D1801 Hemangioma of skin and subcutaneous tissue: Secondary | ICD-10-CM | POA: Diagnosis not present

## 2021-01-11 ENCOUNTER — Other Ambulatory Visit: Payer: Self-pay

## 2021-01-11 ENCOUNTER — Ambulatory Visit (INDEPENDENT_AMBULATORY_CARE_PROVIDER_SITE_OTHER): Payer: PPO | Admitting: *Deleted

## 2021-01-11 DIAGNOSIS — Z23 Encounter for immunization: Secondary | ICD-10-CM

## 2021-01-23 ENCOUNTER — Ambulatory Visit: Payer: PPO

## 2021-02-04 ENCOUNTER — Telehealth: Payer: Self-pay

## 2021-02-04 NOTE — Chronic Care Management (AMB) (Signed)
    Chronic Care Management Pharmacy Assistant   Name: Anthony Collins  MRN: [id number] DOB: 08/16/1945   Reason for Encounter: Gap Adherence    Spoke to pt he reported he checked his blood pressure daily. Reading today was 135/75. He also reported he has TOC to [facility] 1 year ago and is no longer a patient at [facility]. GAP report updated accordingly.   Jobe Gibbon, [place] Pharmacist Assistant  [phone]  Time Spent: 9 minutes

## 2021-04-26 ENCOUNTER — Other Ambulatory Visit: Payer: Self-pay | Admitting: Internal Medicine

## 2021-04-26 DIAGNOSIS — I10 Essential (primary) hypertension: Secondary | ICD-10-CM

## 2021-06-12 ENCOUNTER — Encounter: Payer: Self-pay | Admitting: Internal Medicine

## 2021-06-12 ENCOUNTER — Ambulatory Visit (INDEPENDENT_AMBULATORY_CARE_PROVIDER_SITE_OTHER): Payer: PPO | Admitting: Internal Medicine

## 2021-06-12 ENCOUNTER — Other Ambulatory Visit: Payer: Self-pay

## 2021-06-12 VITALS — BP 164/82 | HR 70 | Temp 97.8°F | Ht 68.0 in | Wt 158.0 lb

## 2021-06-12 DIAGNOSIS — N400 Enlarged prostate without lower urinary tract symptoms: Secondary | ICD-10-CM

## 2021-06-12 DIAGNOSIS — E785 Hyperlipidemia, unspecified: Secondary | ICD-10-CM

## 2021-06-12 DIAGNOSIS — I10 Essential (primary) hypertension: Secondary | ICD-10-CM | POA: Diagnosis not present

## 2021-06-12 DIAGNOSIS — Z23 Encounter for immunization: Secondary | ICD-10-CM

## 2021-06-12 DIAGNOSIS — L219 Seborrheic dermatitis, unspecified: Secondary | ICD-10-CM

## 2021-06-12 DIAGNOSIS — K219 Gastro-esophageal reflux disease without esophagitis: Secondary | ICD-10-CM | POA: Diagnosis not present

## 2021-06-12 DIAGNOSIS — Z0001 Encounter for general adult medical examination with abnormal findings: Secondary | ICD-10-CM

## 2021-06-12 LAB — CBC WITH DIFFERENTIAL/PLATELET
Basophils Absolute: 0 10*3/uL (ref 0.0–0.1)
Basophils Relative: 0.5 % (ref 0.0–3.0)
Eosinophils Absolute: 0.1 10*3/uL (ref 0.0–0.7)
Eosinophils Relative: 1.5 % (ref 0.0–5.0)
HCT: 41.6 % (ref 39.0–52.0)
Hemoglobin: 14.2 g/dL (ref 13.0–17.0)
Lymphocytes Relative: 20.2 % (ref 12.0–46.0)
Lymphs Abs: 0.9 10*3/uL (ref 0.7–4.0)
MCHC: 34.2 g/dL (ref 30.0–36.0)
MCV: 95.5 fl (ref 78.0–100.0)
Monocytes Absolute: 0.4 10*3/uL (ref 0.1–1.0)
Monocytes Relative: 8.6 % (ref 3.0–12.0)
Neutro Abs: 3.1 10*3/uL (ref 1.4–7.7)
Neutrophils Relative %: 69.2 % (ref 43.0–77.0)
Platelets: 241 10*3/uL (ref 150.0–400.0)
RBC: 4.35 Mil/uL (ref 4.22–5.81)
RDW: 13.4 % (ref 11.5–15.5)
WBC: 4.5 10*3/uL (ref 4.0–10.5)

## 2021-06-12 LAB — LIPID PANEL
Cholesterol: 169 mg/dL (ref 0–200)
HDL: 66.9 mg/dL (ref >39.00)
LDL Cholesterol: 85 mg/dL (ref 0–99)
NonHDL: 102.25
Total CHOL/HDL Ratio: 3
Triglycerides: 84 mg/dL (ref 0.0–149.0)
VLDL: 16.8 mg/dL (ref 0.0–40.0)

## 2021-06-12 LAB — TSH: TSH: 2.7 u[IU]/mL (ref 0.35–5.50)

## 2021-06-12 LAB — URINALYSIS, ROUTINE W REFLEX MICROSCOPIC
Bilirubin Urine: NEGATIVE
Hgb urine dipstick: NEGATIVE
Ketones, ur: NEGATIVE
Leukocytes,Ua: NEGATIVE
Nitrite: NEGATIVE
RBC / HPF: NONE SEEN (ref 0–?)
Specific Gravity, Urine: <=1.005 — AB (ref 1.000–1.030)
Total Protein, Urine: NEGATIVE
Urine Glucose: NEGATIVE
Urobilinogen, UA: 0.2 (ref 0.0–1.0)
pH: 6 (ref 5.0–8.0)

## 2021-06-12 LAB — BASIC METABOLIC PANEL
BUN: 18 mg/dL (ref 6–23)
CO2: 30 mEq/L (ref 19–32)
Calcium: 9.7 mg/dL (ref 8.4–10.5)
Chloride: 104 mEq/L (ref 96–112)
Creatinine, Ser: 1 mg/dL (ref 0.40–1.50)
GFR: 73.69 mL/min (ref >60.00)
Glucose, Bld: 100 mg/dL — ABNORMAL HIGH (ref 70–99)
Potassium: 4.6 mEq/L (ref 3.5–5.1)
Sodium: 140 mEq/L (ref 135–145)

## 2021-06-12 LAB — HEPATIC FUNCTION PANEL
ALT: 18 U/L (ref 0–53)
AST: 22 U/L (ref 0–37)
Albumin: 4.7 g/dL (ref 3.5–5.2)
Alkaline Phosphatase: 49 U/L (ref 39–117)
Bilirubin, Direct: 0.2 mg/dL (ref 0.0–0.3)
Total Bilirubin: 1 mg/dL (ref 0.2–1.2)
Total Protein: 7.1 g/dL (ref 6.0–8.3)

## 2021-06-12 LAB — PSA: PSA: 0.49 ng/mL (ref 0.10–4.00)

## 2021-06-12 MED ORDER — BOOSTRIX 5-2.5-18.5 LF-MCG/0.5 IM SUSP: 0.5000 mL | Freq: Once | INTRAMUSCULAR | 0 refills | Status: AC

## 2021-06-12 MED ORDER — FINASTERIDE 5 MG PO TABS: 5.0000 mg | ORAL_TABLET | Freq: Every day | ORAL | 1 refills | Status: DC

## 2021-06-12 MED ORDER — OMEPRAZOLE 40 MG PO CPDR: 40.0000 mg | DELAYED_RELEASE_CAPSULE | Freq: Every day | ORAL | 3 refills | Status: DC | PRN

## 2021-06-12 MED ORDER — METOPROLOL SUCCINATE ER 25 MG PO TB24: 25.0000 mg | ORAL_TABLET | Freq: Every evening | ORAL | 1 refills | Status: DC

## 2021-06-12 MED ORDER — KETOCONAZOLE 2 % EX SHAM: 1.0000 "application " | MEDICATED_SHAMPOO | CUTANEOUS | 0 refills | Status: DC

## 2021-06-12 MED ORDER — TAMSULOSIN HCL 0.4 MG PO CAPS: 0.4000 mg | ORAL_CAPSULE | Freq: Every day | ORAL | 1 refills | Status: DC

## 2021-06-12 MED ORDER — IRBESARTAN 150 MG PO TABS: 150.0000 mg | ORAL_TABLET | Freq: Every day | ORAL | 1 refills | Status: DC

## 2021-06-12 NOTE — Patient Instructions (Signed)

## 2021-06-12 NOTE — Progress Notes (Signed)
? ?Subjective:  ?Patient ID: Anthony Collins, male    DOB: 24-Mar-1946  Age: 76 y.o. MRN: [id number] ? ?CC: Annual Exam and Hypertension ? ?This visit occurred during the SARS-CoV-2 public health emergency.  Safety protocols were in place, including screening questions prior to the visit, additional usage of staff PPE, and extensive cleaning of exam room while observing appropriate contact time as indicated for disinfecting solutions.   ? ?HPI ?Anthony Collins presents for a CPX and f/up - ? ?He walks 2 miles per day and does not experience DOE, CP, SOB, edema, palpitations, or fatigue. His BP at home is always <140/90. ? ?Outpatient Medications Prior to Visit  ?Medication Sig Dispense Refill  ? Bacillus Coagulans-Inulin (PROBIOTIC FORMULA PO) Take by mouth.    ? Cholecalciferol (VITAMIN D3) 1000 UNITS CAPS Take 1 capsule by mouth daily.    ? clotrimazole-betamethasone (LOTRISONE) cream Apply 1 application topically 2 (two) times daily. 45 g 3  ? cyanocobalamin 1000 MCG tablet Take 1,000 mcg by mouth daily.    ? Lutein-Zeaxanthin 20-1 MG CAPS Take 1 capsule by mouth daily.    ? finasteride (PROSCAR) 5 MG tablet Take 1 tablet (5 mg total) by mouth daily. 90 tablet 1  ? irbesartan (AVAPRO) 150 MG tablet Take 1 tablet (150 mg total) by mouth daily. 90 tablet 1  ? metoprolol succinate (TOPROL-XL) 25 MG 24 hr tablet TAKE 1 TABLET BY MOUTH EVERY EVENING 90 tablet 0  ? omeprazole (PRILOSEC) 40 MG capsule Take 1 capsule (40 mg total) by mouth daily as needed (heart burn). 90 capsule 3  ? tamsulosin (FLOMAX) 0.4 MG CAPS capsule Take 1 capsule (0.4 mg total) by mouth daily. 90 capsule 1  ? rosuvastatin (CRESTOR) 5 MG tablet TAKE 1 TABLET (5 MG TOTAL) BY MOUTH DAILY. 90 tablet 1  ? ?No facility-administered medications prior to visit.  ? ? ?ROS ?Review of Systems  ?Constitutional: Negative.  Negative for diaphoresis and fatigue.  ?HENT: Negative.    ?Eyes: Negative.   ?Respiratory:  Negative for cough, chest tightness,  shortness of breath and wheezing.   ?Cardiovascular:  Negative for chest pain, palpitations and leg swelling.  ?Gastrointestinal:  Negative for abdominal pain, constipation, diarrhea, nausea and vomiting.  ?Endocrine: Negative.   ?Genitourinary: Negative.  Negative for difficulty urinating.  ?Musculoskeletal: Negative.  Negative for arthralgias and myalgias.  ?Skin:  Positive for rash. Negative for color change.  ?     Chronic itchy posterior scalp rash. No improvement with OTC products.  ?Neurological:  Negative for dizziness, facial asymmetry and weakness.  ?Hematological:  Negative for adenopathy. Does not bruise/bleed easily.  ?Psychiatric/Behavioral: Negative.    ? ?Objective:  ?BP (!) 164/82 (BP Location: Right Arm)   Pulse 70   Temp 97.8 ?F (36.6 ?C) (Oral)   Ht '5\' 8"'$  (1.727 m)   Wt 158 lb (71.7 kg)   SpO2 98%   BMI 24.02 kg/m?  ? ?BP Readings from Last 3 Encounters:  ?06/12/21 (!) 164/82  ?12/12/20 (!) 146/78  ?06/13/20 103/60  ? ? ?Wt Readings from Last 3 Encounters:  ?06/12/21 158 lb (71.7 kg)  ?12/12/20 156 lb (70.8 kg)  ?06/13/20 154 lb 15.7 oz (70.3 kg)  ? ? ?Physical Exam ?Vitals reviewed.  ?Constitutional:   ?   Appearance: Normal appearance.  ?HENT:  ?   Head:  ? ?   Nose: Nose normal.  ?   Mouth/Throat:  ?   Mouth: Mucous membranes are moist.  ?Eyes:  ?  General: No scleral icterus. ?   Conjunctiva/sclera: Conjunctivae normal.  ?Cardiovascular:  ?   Rate and Rhythm: Normal rate and regular rhythm.  ?   Heart sounds: No murmur heard. ?Pulmonary:  ?   Effort: Pulmonary effort is normal.  ?   Breath sounds: No stridor. No wheezing, rhonchi or rales.  ?Abdominal:  ?   General: Abdomen is flat.  ?   Palpations: There is no mass.  ?   Tenderness: There is no abdominal tenderness. There is no guarding.  ?   Hernia: No hernia is present.  ?Musculoskeletal:  ?   Cervical back: Neck supple.  ?Lymphadenopathy:  ?   Cervical: No cervical adenopathy.  ?Skin: ?   General: Skin is warm and dry.  ?    Findings: Erythema and rash present.  ?Neurological:  ?   General: No focal deficit present.  ?   Mental Status: He is alert. Mental status is at baseline.  ?Psychiatric:     ?   Mood and Affect: Mood normal.     ?   Behavior: Behavior normal.  ? ? ?Lab Results  ?Component Value Date  ? WBC 4.5 06/12/2021  ? HGB 14.2 06/12/2021  ? HCT 41.6 06/12/2021  ? PLT 241.0 06/12/2021  ? GLUCOSE 100 (H) 06/12/2021  ? CHOL 169 06/12/2021  ? TRIG 84.0 06/12/2021  ? HDL 66.90 06/12/2021  ? [place] 85 06/12/2021  ? ALT 18 06/12/2021  ? AST 22 06/12/2021  ? NA 140 06/12/2021  ? K 4.6 06/12/2021  ? CL 104 06/12/2021  ? CREATININE 1.00 06/12/2021  ? BUN 18 06/12/2021  ? CO2 30 06/12/2021  ? TSH 2.70 06/12/2021  ? PSA 0.49 06/12/2021  ? HGBA1C 5.2 01/13/2017  ? ? ?CT CARDIAC SCORING (SELF PAY ONLY) ? ?Addendum Date: 07/16/2020   ?ADDENDUM REPORT: 07/16/2020 12:37 CLINICAL DATA:  Cardiovascular Disease Risk stratification EXAM: Coronary Calcium Score TECHNIQUE: A gated, non-contrast computed tomography scan of the heart was performed using 70m slice thickness. Axial images were analyzed on a dedicated workstation. Calcium scoring of the coronary arteries was performed using the Agatston method. FINDINGS: Coronary Calcium Score: Left main: 0 Left anterior descending artery: 0 Left circumflex artery: 0 Right coronary artery: 0 Total: 0 Percentile: NA Pericardium: Normal. Ascending Aorta: Normal caliber. [facility]-cardiac: [facility]. IMPRESSION: Coronary calcium score of 0. RECOMMENDATIONS: Coronary artery calcium (CAC) score is a strong predictor of incident coronary heart disease (CHD) and provides predictive information beyond traditional risk factors. CAC scoring is reasonable to use in the decision to withhold, postpone, or initiate statin therapy in intermediate-risk or selected borderline-risk asymptomatic adults (age 76-75years and LDL-C >=70 to <190 mg/dL) who do not have diabetes or established  atherosclerotic cardiovascular disease (ASCVD).* In intermediate-risk (10-year ASCVD risk >=7.5% to <20%) adults or selected borderline-risk (10-year ASCVD risk >=5% to <7.5%) adults in whom a CAC score is measured for the purpose of making a treatment decision the following recommendations have been made: If CAC=0, it is reasonable to withhold statin therapy and reassess in 5 to 10 years, as long as higher risk conditions are absent (diabetes mellitus, family history of premature CHD in first degree relatives (males <55 years; females <65 years), cigarette smoking, or LDL >=190 mg/dL). If CAC is 1 to 99, it is reasonable to initiate statin therapy for patients >=527years of age. If CAC is >=100 or >=75th percentile, it is reasonable to initiate statin therapy at any age. Cardiology referral  should be considered for patients with CAC scores >=400 or >=75th percentile. *2018 AHA/ACC/AACVPR/AAPA/ABC/ACPM/ADA/AGS/APhA/ASPC/NLA/PCNA Guideline on the Management of Blood Cholesterol: A Report of the American College of Cardiology/American Heart Association Task Force on Clinical Practice Guidelines. J Am Coll Cardiol. 2019;73(24):3168-3209. Eleonore Chiquito, MD Electronically Signed   By: Eleonore Chiquito   On: 07/16/2020 12:37  ? ?Result Date: 07/16/2020 ?EXAM: OVER-READ INTERPRETATION  CT CHEST The following report is an over-read performed by radiologist Dr. Vinnie Langton of [facility], [place] on 07/16/2020. This over-read does not include interpretation of cardiac or coronary anatomy or pathology. The coronary calcium score interpretation by the cardiologist is attached. COMPARISON:  None. FINDINGS: 3 mm right lower lobe pulmonary nodule (axial image 7 of series 3). Within the visualized portions of the thorax there are no other larger more suspicious appearing pulmonary nodules or masses, there is no acute consolidative airspace disease, no pleural effusions, no pneumothorax and no lymphadenopathy. Visualized  portions of the upper abdomen are unremarkable. There are no aggressive appearing lytic or blastic lesions noted in the visualized portions of the skeleton. IMPRESSION: 1. 3 mm right lower lobe pulmonary nodule, nonspecific, but

## 2021-06-24 ENCOUNTER — Ambulatory Visit (INDEPENDENT_AMBULATORY_CARE_PROVIDER_SITE_OTHER): Payer: PPO

## 2021-06-24 DIAGNOSIS — Z0000 Encounter for general adult medical examination without abnormal findings: Secondary | ICD-10-CM | POA: Diagnosis not present

## 2021-06-24 NOTE — Patient Instructions (Signed)
Anthony Collins , ?Thank you for taking time to come for your Medicare Wellness Visit. I appreciate your ongoing commitment to your health goals. Please review the following plan we discussed and let me know if I can assist you in the future.  ? ?Screening recommendations/referrals: ?Colonoscopy: 07/28/2019 ?Recommended yearly ophthalmology/optometry visit for glaucoma screening and checkup ?Recommended yearly dental visit for hygiene and checkup ? ?Vaccinations: ?Influenza vaccine: completed  ?Pneumococcal vaccine: completed  ?Tdap vaccine: due  ?Shingles vaccine: completed    ? ?Advanced directives: yes  ? ?Conditions/risks identified: none  ? ?Next appointment: none  ? ?Preventive Care 33 Years and Older, Male ?Preventive care refers to lifestyle choices and visits with your health care provider that can promote health and wellness. ?What does preventive care include? ?A yearly physical exam. This is also called an annual well check. ?Dental exams once or twice a year. ?Routine eye exams. Ask your health care provider how often you should have your eyes checked. ?Personal lifestyle choices, including: ?Daily care of your teeth and gums. ?Regular physical activity. ?Eating a healthy diet. ?Avoiding tobacco and drug use. ?Limiting alcohol use. ?Practicing safe sex. ?Taking low doses of aspirin every day. ?Taking vitamin and mineral supplements as recommended by your health care provider. ?What happens during an annual well check? ?The services and screenings done by your health care provider during your annual well check will depend on your age, overall health, lifestyle risk factors, and family history of disease. ?Counseling  ?Your health care provider may ask you questions about your: ?Alcohol use. ?Tobacco use. ?Drug use. ?Emotional well-being. ?Home and relationship well-being. ?Sexual activity. ?Eating habits. ?History of falls. ?Memory and ability to understand (cognition). ?Work and work Statistician. ?Screening   ?You may have the following tests or measurements: ?Height, weight, and BMI. ?Blood pressure. ?Lipid and cholesterol levels. These may be checked every 5 years, or more frequently if you are over 64 years old. ?Skin check. ?Lung cancer screening. You may have this screening every year starting at age 69 if you have a 30-pack-year history of smoking and currently smoke or have quit within the past 15 years. ?Fecal occult blood test (FOBT) of the stool. You may have this test every year starting at age 35. ?Flexible sigmoidoscopy or colonoscopy. You may have a sigmoidoscopy every 5 years or a colonoscopy every 10 years starting at age 88. ?Prostate cancer screening. Recommendations will vary depending on your family history and other risks. ?Hepatitis C blood test. ?Hepatitis B blood test. ?Sexually transmitted disease (STD) testing. ?Diabetes screening. This is done by checking your blood sugar (glucose) after you have not eaten for a while (fasting). You may have this done every 1-3 years. ?Abdominal aortic aneurysm (AAA) screening. You may need this if you are a current or former smoker. ?Osteoporosis. You may be screened starting at age 62 if you are at high risk. ?Talk with your health care provider about your test results, treatment options, and if necessary, the need for more tests. ?Vaccines  ?Your health care provider may recommend certain vaccines, such as: ?Influenza vaccine. This is recommended every year. ?Tetanus, diphtheria, and acellular pertussis (Tdap, Td) vaccine. You may need a Td booster every 10 years. ?Zoster vaccine. You may need this after age 103. ?Pneumococcal 13-valent conjugate (PCV13) vaccine. One dose is recommended after age 36. ?Pneumococcal polysaccharide (PPSV23) vaccine. One dose is recommended after age 40. ?Talk to your health care provider about which screenings and vaccines you need and how often you  need them. ?This information is not intended to replace advice given to you by  your health care provider. Make sure you discuss any questions you have with your health care provider. ?Document Released: 04/06/2015 Document Revised: 11/28/2015 Document Reviewed: 01/09/2015 ?Elsevier Interactive Patient Education ? 2017 [employer]. ? ?Fall Prevention in the Home ?Falls can cause injuries. They can happen to people of all ages. There are many things you can do to make your home safe and to help prevent falls. ?What can I do on the outside of my home? ?Regularly fix the edges of walkways and driveways and fix any cracks. ?Remove anything that might make you trip as you walk through a door, such as a raised step or threshold. ?Trim any bushes or trees on the path to your home. ?Use bright outdoor lighting. ?Clear any walking paths of anything that might make someone trip, such as rocks or tools. ?Regularly check to see if handrails are loose or broken. Make sure that both sides of any steps have handrails. ?Any raised decks and porches should have guardrails on the edges. ?Have any leaves, snow, or ice cleared regularly. ?Use sand or salt on walking paths during winter. ?Clean up any spills in your garage right away. This includes oil or grease spills. ?What can I do in the bathroom? ?Use night lights. ?Install grab bars by the toilet and in the tub and shower. Do not use towel bars as grab bars. ?Use non-skid mats or decals in the tub or shower. ?If you need to sit down in the shower, use a plastic, non-slip stool. ?Keep the floor dry. Clean up any water that spills on the floor as soon as it happens. ?Remove soap buildup in the tub or shower regularly. ?Attach bath mats securely with double-sided non-slip rug tape. ?Do not have throw rugs and other things on the floor that can make you trip. ?What can I do in the bedroom? ?Use night lights. ?Make sure that you have a light by your bed that is easy to reach. ?Do not use any sheets or blankets that are too big for your bed. They should not hang  down onto the floor. ?Have a firm chair that has side arms. You can use this for support while you get dressed. ?Do not have throw rugs and other things on the floor that can make you trip. ?What can I do in the kitchen? ?Clean up any spills right away. ?Avoid walking on wet floors. ?Keep items that you use a lot in easy-to-reach places. ?If you need to reach something above you, use a strong step stool that has a grab bar. ?Keep electrical cords out of the way. ?Do not use floor polish or wax that makes floors slippery. If you must use wax, use non-skid floor wax. ?Do not have throw rugs and other things on the floor that can make you trip. ?What can I do with my stairs? ?Do not leave any items on the stairs. ?Make sure that there are handrails on both sides of the stairs and use them. Fix handrails that are broken or loose. Make sure that handrails are as long as the stairways. ?Check any carpeting to make sure that it is firmly attached to the stairs. Fix any carpet that is loose or worn. ?Avoid having throw rugs at the top or bottom of the stairs. If you do have throw rugs, attach them to the floor with carpet tape. ?Make sure that you have a light switch  at the top of the stairs and the bottom of the stairs. If you do not have them, ask someone to add them for you. ?What else can I do to help prevent falls? ?Wear shoes that: ?Do not have high heels. ?Have rubber bottoms. ?Are comfortable and fit you well. ?Are closed at the toe. Do not wear sandals. ?If you use a stepladder: ?Make sure that it is fully opened. Do not climb a closed stepladder. ?Make sure that both sides of the stepladder are locked into place. ?Ask someone to hold it for you, if possible. ?Clearly mark and make sure that you can see: ?Any grab bars or handrails. ?First and last steps. ?Where the edge of each step is. ?Use tools that help you move around (mobility aids) if they are needed. These  include: ?Canes. ?Walkers. ?Scooters. ?Crutches. ?Turn on the lights when you go into a dark area. Replace any light bulbs as soon as they burn out. ?Set up your furniture so you have a clear path. Avoid moving your furniture around. ?If any of your f

## 2021-06-24 NOTE — Progress Notes (Signed)
? ?Subjective:  ? Anthony Collins is a 76 y.o. male who presents for an Subsequent  Medicare Annual Wellness Visit. ? ? ?I connected with Kristian Covey today by telephone and verified that I am speaking with the correct person using two identifiers. ?Location patient: home ?Location provider: work ?Persons participating in the virtual visit: patient, provider. ?  ?I discussed the limitations, risks, security and privacy concerns of performing an evaluation and management service by telephone and the availability of in person appointments. I also discussed with the patient that there may be a patient responsible charge related to this service. The patient expressed understanding and verbally consented to this telephonic visit.  ?  ?Interactive audio and video telecommunications were attempted between this provider and patient, however failed, due to patient having technical difficulties OR patient did not have access to video capability.  We continued and completed visit with audio only. ? ?  ?Review of Systems    ? ?Cardiac Risk Factors include: advanced age (>51mn, >>32women);hypertension;male gender ? ?   ?Objective:  ?  ?Today's Vitals  ? ?There is no height or weight on file to calculate BMI. ? ? ?  06/24/2021  ?  1:31 PM 06/13/2020  ?  9:08 AM 06/06/2020  ?  4:13 PM 05/23/2019  ?  8:26 AM 11/26/2017  ? 10:11 AM 05/04/2017  ?  6:43 AM 04/24/2017  ?  1:37 PM  ?Advanced Directives  ?Does Patient Have a Medical Advance Directive? Yes Yes Yes Yes Yes Yes Yes  ?Type of AParamedicof ADixonLiving will [place]Living will [place]rat[place]Living will HDiamond RidgeLiving will HMaple FallsLiving will HEllenvilleLiving will HDulceLiving will  ?Does patient want to make changes to medical advance directive?  No - Patient declined No - Patient declined Yes (ED - Information included in AVS)  No -  Patient declined   ?Copy of HClarksvillein Chart? No - copy requested Yes - validated most recent copy scanned in chart (See row information)  No - copy requested No - copy requested No - copy requested   ? ? ?Current Medications (verified) ?Outpatient Encounter Medications as of 06/24/2021  ?Medication Sig  ? Bacillus Coagulans-Inulin (PROBIOTIC FORMULA PO) Take by mouth.  ? Cholecalciferol (VITAMIN D3) 1000 UNITS CAPS Take 1 capsule by mouth daily.  ? clotrimazole-betamethasone (LOTRISONE) cream Apply 1 application topically 2 (two) times daily.  ? cyanocobalamin 1000 MCG tablet Take 1,000 mcg by mouth daily.  ? finasteride (PROSCAR) 5 MG tablet Take 1 tablet (5 mg total) by mouth daily.  ? irbesartan (AVAPRO) 150 MG tablet Take 1 tablet (150 mg total) by mouth daily.  ? ketoconazole (NIZORAL) 2 % shampoo Apply 1 application. topically every other day.  ? Lutein-Zeaxanthin 20-1 MG CAPS Take 1 capsule by mouth daily.  ? metoprolol succinate (TOPROL-XL) 25 MG 24 hr tablet Take 1 tablet (25 mg total) by mouth every evening.  ? omeprazole (PRILOSEC) 40 MG capsule Take 1 capsule (40 mg total) by mouth daily as needed (heart burn).  ? tamsulosin (FLOMAX) 0.4 MG CAPS capsule Take 1 capsule (0.4 mg total) by mouth daily.  ? ?No facility-administered encounter medications on file as of 06/24/2021.  ? ? ?Allergies (verified) ?Patient has no known allergies.  ? ?History: ?Past Medical History:  ?Diagnosis Date  ? Bladder calculus   ? GERD (gastroesophageal reflux disease)   ? History of  kidney stones   ? Hypertension   ? Spinal stenosis   ? Urgency of urination   ? Wears glasses   ? ?Past Surgical History:  ?Procedure Laterality Date  ? BLEPHAROPLASTY    ? EXTRACORPOREAL SHOCK WAVE LITHOTRIPSY  2004  ? EYE SURGERY    ? INGUINAL HERNIA REPAIR Right 05/04/2017  ? Procedure: OPEN RIGHT INGUINAL HERNIA REPAIR WITH MESH;  Surgeon: Judeth Horn, MD;  Location: [place];  Service: General;  Laterality:  Right;  ? [place]  (spinal anesthesia)  ? INGUINAL HERNIA REPAIR Bilateral 06/13/2020  ? Procedure: LAPAROSCOPIC RIGHT INGUINAL HERNIA REPAIR WITH MESH;  Surgeon: Erroll Luna, MD;  Location: [place];  Service: General;  Laterality: Bilateral;  ROOM 8 STARTING AT 11:00AM FOR 120 MIN  ? INSERTION OF MESH Right 05/04/2017  ? Procedure: INSERTION OF MESH;  Surgeon: Judeth Horn, MD;  Location: [place];  Service: General;  Laterality: Right;  ? TONSILLECTOMY  age 45  ? ?Family History  ?Problem Relation Age of Onset  ? Breast cancer Mother   ? Hypertension Mother   ? Lung cancer Father   ? Colon cancer Neg Hx   ? Esophageal cancer Neg Hx   ? Pancreatic cancer Neg Hx   ? Stomach cancer Neg Hx   ? ?Social History  ? ?Socioeconomic History  ? Marital status: Married  ?  Spouse name: Not on file  ? Number of children: Not on file  ? Years of education: 102  ? Highest education level: Not on file  ?Occupational History  ? Occupation: Retired   ?Tobacco Use  ? Smoking status: Never  ? Smokeless tobacco: Never  ?Vaping Use  ? Vaping Use: Never used  ?Substance and Sexual Activity  ? Alcohol use: Yes  ?  Alcohol/week: 14.0 standard drinks  ?  Types: 14 Glasses of wine Anthony week  ?  Comment: 2 wine daily  ? Drug use: No  ? Sexual activity: Yes  ?  Partners: Female  ?Other Topics Concern  ? Not on file  ?Social History Narrative  ? Fun/Hobby: Exercise, Baseball card collector, 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  ? ?Social Determinants of Health  ? ?Financial Resource Strain: Low Risk   ? Difficulty of Paying Living Expenses: Not hard at all  ?Food Insecurity: No Food Insecurity  ? Worried About Charity fundraiser in the Last Year: Never true  ? Ran Out of Food in the Last Year: Never true  ?Transportation Needs: No Transportation Needs  ? Lack of Transportation (Medical): No  ? Lack of Transportation (Non-Medical): No  ?Physical Activity: Sufficiently Active  ? Days of Exercise Anthony  Week: 4 days  ? Minutes of Exercise Anthony Session: 40 min  ?Stress: No Stress Concern Present  ? Feeling of Stress : Not at all  ?Social Connections: Moderately Integrated  ? Frequency of Communication with Friends and Family: Twice a week  ? Frequency of Social Gatherings with Friends and Family: Twice a week  ? Attends Religious Services: 1 to 4 times Anthony year  ? Active Member of Clubs or Organizations: No  ? Attends Archivist Meetings: Never  ? Marital Status: Married  ? ? ?Tobacco Counseling ?Counseling given: Not Answered ? ? ?Clinical Intake: ? ?Pre-visit preparation completed: Yes ? ?Pain : No/denies pain ? ?  ? ?Nutritional Risks: None ?Diabetes: No ? ?How often do you need to have someone help you when you read  instructions, pamphlets, or other written materials from your doctor or pharmacy?: 1 - Never ?What is the last grade level you completed in school?: college ? ?Diabetic?no  ? ?Interpreter Needed?: No ? ?Information entered by :: l.Caprice Mccaffrey,LPN ? ? ?Activities of Daily Living ? ?  06/24/2021  ?  1:32 PM  ?In your present state of health, do you have any difficulty performing the following activities:  ?Hearing? 0  ?Vision? 0  ?Difficulty concentrating or making decisions? 0  ?Walking or climbing stairs? 0  ?Dressing or bathing? 0  ?Doing errands, shopping? 0  ?Preparing Food and eating ? N  ?Using the Toilet? N  ?In the past six months, have you accidently leaked urine? N  ?Do you have problems with loss of bowel control? N  ?Managing your Medications? N  ?Managing your Finances? N  ?Housekeeping or managing your Housekeeping? N  ? ? ?Patient Care Team: ?Janith Lima, MD as PCP - General (Internal Medicine) ?Nickie Retort, MD (Inactive) as Consulting Physician (Urology) ?Jarome Matin, MD as Consulting Physician (Dermatology) ?Luberta Mutter, MD as Consulting Physician (Ophthalmology) ?Royston Cowper, DDS (Dentistry) ?Lorenda Cahill, DMD (Dentistry) ?Marybelle Killings, MD as Consulting  Physician (Orthopedic Surgery) ?Madelin Rear, [facility] as Pharmacist (Pharmacist) ? ?Indicate any recent Medical Services you may have received from other than [facility] providers in the past year (date may be approximate).

## 2021-06-27 ENCOUNTER — Other Ambulatory Visit: Payer: Self-pay | Admitting: Internal Medicine

## 2021-06-27 DIAGNOSIS — R918 Other nonspecific abnormal finding of lung field: Secondary | ICD-10-CM

## 2021-06-27 DIAGNOSIS — R911 Solitary pulmonary nodule: Secondary | ICD-10-CM

## 2021-07-09 ENCOUNTER — Inpatient Hospital Stay: Admission: RE | Admit: 2021-07-09 | Payer: PPO | Source: Ambulatory Visit

## 2021-09-13 ENCOUNTER — Other Ambulatory Visit: Payer: Self-pay | Admitting: Internal Medicine

## 2021-09-13 DIAGNOSIS — L309 Dermatitis, unspecified: Secondary | ICD-10-CM

## 2021-12-16 ENCOUNTER — Encounter: Payer: Self-pay | Admitting: Internal Medicine

## 2021-12-16 ENCOUNTER — Ambulatory Visit (INDEPENDENT_AMBULATORY_CARE_PROVIDER_SITE_OTHER): Payer: PPO | Admitting: Internal Medicine

## 2021-12-16 ENCOUNTER — Ambulatory Visit (INDEPENDENT_AMBULATORY_CARE_PROVIDER_SITE_OTHER): Payer: PPO

## 2021-12-16 VITALS — BP 146/82 | HR 75 | Temp 98.1°F | Resp 16 | Ht 68.0 in | Wt 156.0 lb

## 2021-12-16 DIAGNOSIS — I10 Essential (primary) hypertension: Secondary | ICD-10-CM | POA: Diagnosis not present

## 2021-12-16 DIAGNOSIS — R197 Diarrhea, unspecified: Secondary | ICD-10-CM | POA: Diagnosis not present

## 2021-12-16 DIAGNOSIS — K580 Irritable bowel syndrome with diarrhea: Secondary | ICD-10-CM | POA: Diagnosis not present

## 2021-12-16 DIAGNOSIS — L219 Seborrheic dermatitis, unspecified: Secondary | ICD-10-CM | POA: Diagnosis not present

## 2021-12-16 DIAGNOSIS — R911 Solitary pulmonary nodule: Secondary | ICD-10-CM

## 2021-12-16 LAB — CBC WITH DIFFERENTIAL/PLATELET
Basophils Absolute: 0 10*3/uL (ref 0.0–0.1)
Basophils Relative: 0.4 % (ref 0.0–3.0)
Eosinophils Absolute: 0.1 10*3/uL (ref 0.0–0.7)
Eosinophils Relative: 3.1 % (ref 0.0–5.0)
HCT: 39.7 % (ref 39.0–52.0)
Hemoglobin: 13.8 g/dL (ref 13.0–17.0)
Lymphocytes Relative: 22.4 % (ref 12.0–46.0)
Lymphs Abs: 1 10*3/uL (ref 0.7–4.0)
MCHC: 34.8 g/dL (ref 30.0–36.0)
MCV: 94 fl (ref 78.0–100.0)
Monocytes Absolute: 0.4 10*3/uL (ref 0.1–1.0)
Monocytes Relative: 8.9 % (ref 3.0–12.0)
Neutro Abs: 3 10*3/uL (ref 1.4–7.7)
Neutrophils Relative %: 65.2 % (ref 43.0–77.0)
Platelets: 238 10*3/uL (ref 150.0–400.0)
RBC: 4.22 Mil/uL (ref 4.22–5.81)
RDW: 13.3 % (ref 11.5–15.5)
WBC: 4.5 10*3/uL (ref 4.0–10.5)

## 2021-12-16 LAB — BASIC METABOLIC PANEL
BUN: 16 mg/dL (ref 6–23)
CO2: 28 mEq/L (ref 19–32)
Calcium: 9.5 mg/dL (ref 8.4–10.5)
Chloride: 103 mEq/L (ref 96–112)
Creatinine, Ser: 1.02 mg/dL (ref 0.40–1.50)
GFR: 71.7 mL/min (ref >60.00)
Glucose, Bld: 105 mg/dL — ABNORMAL HIGH (ref 70–99)
Potassium: 4.3 mEq/L (ref 3.5–5.1)
Sodium: 140 mEq/L (ref 135–145)

## 2021-12-16 NOTE — Progress Notes (Signed)
Subjective:  Patient ID: Anthony Collins, male    DOB: Jul 23, 1945  Age: 76 y.o. MRN: [id number]  CC: Diarrhea   HPI Anthony Collins presents for f/up -  He complains of a 17-monthhistory of intermittent diarrhea with gas and bloating.  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  He denies abdominal pain, nausea, vomiting, bright red blood per rectum, or melena.  He has a history of a lung nodule.  A CT scan for follow-up was recently ordered but he has decided not to do the CT scan.  Outpatient Medications Prior to Visit  Medication Sig Dispense Refill   Bacillus Coagulans-Inulin (PROBIOTIC FORMULA PO) Take by mouth.     Cholecalciferol (VITAMIN D3) 1000 UNITS CAPS Take 1 capsule by mouth daily.     clotrimazole-betamethasone (LOTRISONE) cream APPLY TO AFFECTED AREA TWICE A DAY 45 g 3   cyanocobalamin 1000 MCG tablet Take 1,000 mcg by mouth daily.     finasteride (PROSCAR) 5 MG tablet Take 1 tablet (5 mg total) by mouth daily. 90 tablet 1   irbesartan (AVAPRO) 150 MG tablet Take 1 tablet (150 mg total) by mouth daily. 90 tablet 1   Lutein-Zeaxanthin 20-1 MG CAPS Take 1 capsule by mouth daily.     metoprolol succinate (TOPROL-XL) 25 MG 24 hr tablet Take 1 tablet (25 mg total) by mouth every evening. 90 tablet 1   omeprazole (PRILOSEC) 40 MG capsule Take 1 capsule (40 mg total) by mouth daily as needed (heart burn). 90 capsule 3   tamsulosin (FLOMAX) 0.4 MG CAPS capsule Take 1 capsule (0.4 mg total) by mouth daily. 90 capsule 1   ketoconazole (NIZORAL) 2 % shampoo Apply 1 application. topically every other day. 240 mL 0   No facility-administered medications prior to visit.    ROS Review of Systems  Constitutional:  Negative for chills, diaphoresis, fatigue and fever.  HENT: Negative.    Eyes: Negative.   Respiratory:  Negative for cough, chest tightness, shortness of breath and wheezing.   Cardiovascular:  Negative for chest pain, palpitations and leg swelling.   Gastrointestinal:  Positive for diarrhea. Negative for anal bleeding, blood in stool, constipation, nausea and vomiting.  Endocrine: Negative.   Genitourinary: Negative.  Negative for difficulty urinating.  Musculoskeletal: Negative.   Skin:  Positive for rash.  Neurological:  Negative for dizziness, weakness and light-headedness.  Hematological:  Negative for adenopathy. Does not bruise/bleed easily.  Psychiatric/Behavioral: Negative.      Objective:  BP (!) 146/82 (BP Location: Right Arm, Patient Position: Sitting, Cuff Size: Large)   Pulse 75   Temp 98.1 F (36.7 C) (Oral)   Resp 16   Ht '5\' 8"'$  (1.727 m)   Wt 156 lb (70.8 kg)   SpO2 98%   BMI 23.72 kg/m   BP Readings from Last 3 Encounters:  12/16/21 (!) 146/82  06/12/21 (!) 164/82  12/12/20 (!) 146/78    Wt Readings from Last 3 Encounters:  12/16/21 156 lb (70.8 kg)  06/12/21 158 lb (71.7 kg)  12/12/20 156 lb (70.8 kg)    Physical Exam Vitals reviewed.  Constitutional:      General: He is not in acute distress.    Appearance: He is not ill-appearing, toxic-appearing or diaphoretic.  HENT:     Mouth/Throat:     Mouth: Mucous membranes are moist.  Eyes:     General: No scleral icterus.    Conjunctiva/sclera: Conjunctivae normal.  Cardiovascular:  Rate and Rhythm: Normal rate and regular rhythm.     Heart sounds: No murmur heard. Pulmonary:     Effort: Pulmonary effort is normal.     Breath sounds: No stridor. No wheezing, rhonchi or rales.  Abdominal:     Palpations: There is no mass.     Tenderness: There is no abdominal tenderness. There is no guarding.     Hernia: No hernia is present.  Musculoskeletal:        General: Normal range of motion.     Cervical back: Neck supple.     Right lower leg: No edema.     Left lower leg: No edema.  Lymphadenopathy:     Cervical: No cervical adenopathy.  Skin:    General: Skin is warm and dry.  Neurological:     General: No focal deficit present.     Mental  Status: He is alert.  Psychiatric:        Mood and Affect: Mood normal.        Behavior: Behavior normal.     Lab Results  Component Value Date   WBC 4.5 12/16/2021   HGB 13.8 12/16/2021   HCT 39.7 12/16/2021   PLT 238.0 12/16/2021   GLUCOSE 105 (H) 12/16/2021   CHOL 169 06/12/2021   TRIG 84.0 06/12/2021   HDL 66.90 06/12/2021   LDLCALC 85 06/12/2021   ALT 18 06/12/2021   AST 22 06/12/2021   NA 140 12/16/2021   K 4.3 12/16/2021   CL 103 12/16/2021   CREATININE 1.02 12/16/2021   BUN 16 12/16/2021   CO2 28 12/16/2021   TSH 2.70 06/12/2021   PSA 0.49 06/12/2021   HGBA1C 5.2 01/13/2017    CT CARDIAC SCORING (SELF PAY ONLY)  Addendum Date: 07/16/2020   ADDENDUM REPORT: 07/16/2020 12:37 CLINICAL DATA:  Cardiovascular Disease Risk stratification EXAM: Coronary Calcium Score TECHNIQUE: A gated, non-contrast computed tomography scan of the heart was performed using 15m slice thickness. Axial images were analyzed on a dedicated workstation. Calcium scoring of the coronary arteries was performed using the Agatston method. FINDINGS: Coronary Calcium Score: Left main: 0 Left anterior descending artery: 0 Left circumflex artery: 0 Right coronary artery: 0 Total: 0 Percentile: NA Pericardium: Normal. Ascending Aorta: Normal caliber. [facility]-cardiac: [facility]. IMPRESSION: Coronary calcium score of 0. RECOMMENDATIONS: Coronary artery calcium (CAC) score is a strong predictor of incident coronary heart disease (CHD) and provides predictive information beyond traditional risk factors. CAC scoring is reasonable to use in the decision to withhold, postpone, or initiate statin therapy in intermediate-risk or selected borderline-risk asymptomatic adults (age 76-75years and LDL-C >=70 to <190 mg/dL) who do not have diabetes or established atherosclerotic cardiovascular disease (ASCVD).* In intermediate-risk (10-year ASCVD risk >=7.5% to <20%) adults or selected  borderline-risk (10-year ASCVD risk >=5% to <7.5%) adults in whom a CAC score is measured for the purpose of making a treatment decision the following recommendations have been made: If CAC=0, it is reasonable to withhold statin therapy and reassess in 5 to 10 years, as long as higher risk conditions are absent (diabetes mellitus, family history of premature CHD in first degree relatives (males <55 years; females <65 years), cigarette smoking, or LDL >=190 mg/dL). If CAC is 1 to 99, it is reasonable to initiate statin therapy for patients >=556years of age. If CAC is >=100 or >=75th percentile, it is reasonable to initiate statin therapy at any age. Cardiology referral should be considered for patients with CAC  scores >=400 or >=75th percentile. *2018 AHA/ACC/AACVPR/AAPA/ABC/ACPM/ADA/AGS/APhA/ASPC/NLA/PCNA Guideline on the Management of Blood Cholesterol: A Report of the American College of Cardiology/American Heart Association Task Force on Clinical Practice Guidelines. J Am Coll Cardiol. 2019;73(24):3168-3209. Eleonore Chiquito, MD Electronically Signed   By: Eleonore Chiquito   On: 07/16/2020 12:37   Result Date: 07/16/2020 EXAM: OVER-READ INTERPRETATION  CT CHEST The following report is an over-read performed by radiologist Dr. Vinnie Langton of [facility], [place] on 07/16/2020. This over-read does not include interpretation of cardiac or coronary anatomy or pathology. The coronary calcium score interpretation by the cardiologist is attached. COMPARISON:  None. FINDINGS: 3 mm right lower lobe pulmonary nodule (axial image 7 of series 3). Within the visualized portions of the thorax there are no other larger more suspicious appearing pulmonary nodules or masses, there is no acute consolidative airspace disease, no pleural effusions, no pneumothorax and no lymphadenopathy. Visualized portions of the upper abdomen are unremarkable. There are no aggressive appearing lytic or blastic lesions noted in the visualized  portions of the skeleton. IMPRESSION: 1. 3 mm right lower lobe pulmonary nodule, nonspecific, but statistically likely benign. No follow-up needed if patient is low-risk. Non-contrast chest CT can be considered in 12 months if patient is high-risk. This recommendation follows the consensus statement: Guidelines for Management of Incidental Pulmonary Nodules Detected on CT Images: From the Fleischner Society 2017; Radiology 2017; 284:228-243. Electronically Signed: By: Vinnie Langton M.D. On: 07/16/2020 10:08    DG Chest 2 View  Result Date: 12/16/2021 CLINICAL DATA:  76 year old male with nodule follow-up EXAM: CHEST - 2 VIEW COMPARISON:  04/29/2017, cardiac CT 07/16/2020 FINDINGS: Cardiomediastinal silhouette unchanged in size and contour. No evidence of central vascular congestion. No interlobular septal thickening. No nodules identified on the current plain film No pneumothorax or pleural effusion. Coarsened interstitial markings, with no confluent airspace disease. No acute displaced fracture. Degenerative changes of the spine. IMPRESSION: Negative for acute cardiopulmonary disease. Nodule identified on prior CT dated 07/16/2020 not visualized on the current plain film. Note that the standard of care for nodule follow-up is CT imaging, as was previously noted on the cardiac CT over read Electronically Signed   By: Corrie Mckusick D.O.   On: 12/16/2021 16:04     Assessment & Plan:   Osa was seen today for diarrhea.  Diagnoses and all orders for this visit:  Diarrhea, unspecified type- Will evaluate for other causes and will treat for IBS/D. -     CBC with Differential/Platelet; Future -     Tissue Transglutaminase Abs,IgG,IgA -     Fecal lactoferrin, quant; Future -     CALPROTECTIN; Future -     Pancreatic elastase, fecal; Future -     CBC with Differential/Platelet -     Pancreatic elastase, fecal -     CALPROTECTIN -     Fecal lactoferrin, quant  Nodule of lower lobe of right lung-  His chest x-ray is negative. -     DG Chest 2 View; Future  Essential hypertension- His blood pressure is well controlled. -     Basic metabolic panel; Future -     CBC with Differential/Platelet; Future -     CBC with Differential/Platelet -     Basic metabolic panel  Seborrheic dermatitis of scalp -     ketoconazole (NIZORAL) 2 % shampoo; Apply 1 Application topically every other day.  Irritable bowel syndrome with diarrhea -     rifaximin (XIFAXAN) 550 MG TABS tablet; Take 1  tablet (550 mg total) by mouth 3 (three) times daily for 14 days.   I have changed [place]. Shutters's ketoconazole. I am also having him start on rifaximin. Additionally, I am having him maintain his Vitamin D3, Bacillus Coagulans-Inulin (PROBIOTIC FORMULA PO), cyanocobalamin, Lutein-Zeaxanthin, irbesartan, tamsulosin, finasteride, omeprazole, metoprolol succinate, and clotrimazole-betamethasone.  Meds ordered this encounter  Medications   ketoconazole (NIZORAL) 2 % shampoo    Sig: Apply 1 Application topically every other day.    Dispense:  240 mL    Refill:  0   rifaximin (XIFAXAN) 550 MG TABS tablet    Sig: Take 1 tablet (550 mg total) by mouth 3 (three) times daily for 14 days.    Dispense:  42 tablet    Refill:  0   I spent 45 minutes in preparing to see the patient by review of recent labs and images, obtaining and reviewing separately obtained history, communicating with the patient, ordering medications, labs, and an xray, and documenting clinical information in the EHR including the differential Dx, treatment, and any further evaluation and management of multiple complex medical issues.     Follow-up: Return in about 3 months (around 03/17/2022).  Scarlette Calico, MD

## 2021-12-16 NOTE — Patient Instructions (Signed)
Diarrhea, Adult Diarrhea is frequent loose and watery bowel movements. Diarrhea can make you feel weak and cause you to become dehydrated. Dehydration can make you tired and thirsty, cause you to have a dry mouth, and decrease how often you urinate. Diarrhea typically lasts 2-3 days. However, it can last longer if it is a sign of something more serious. It is important to treat your diarrhea as told by your health care provider. Follow these instructions at home: Eating and drinking     Follow these recommendations as told by your health care provider: Take an oral rehydration solution (ORS). This is an over-the-counter medicine that helps return your body to its normal balance of nutrients and water. It is found at pharmacies and retail stores. Drink plenty of fluids, such as water, ice chips, diluted fruit juice, and low-calorie sports drinks. You can drink milk also, if desired. Avoid drinking fluids that contain a lot of sugar or caffeine, such as energy drinks, sports drinks, and soda. Eat bland, easy-to-digest foods in small amounts as you are able. These foods include bananas, applesauce, rice, lean meats, toast, and crackers. Avoid alcohol. Avoid spicy or fatty foods.  Medicines Take over-the-counter and prescription medicines only as told by your health care provider. If you were prescribed an antibiotic medicine, take it as told by your health care provider. Do not stop using the antibiotic even if you start to feel better. General instructions  Wash your hands often using soap and water. If soap and water are not available, use a hand sanitizer. Others in the household should wash their hands as well. Hands should be washed: After using the toilet or changing a diaper. Before preparing, cooking, or serving food. While caring for a sick person or while visiting someone in a hospital. Drink enough fluid to keep your urine pale yellow. Rest at home while you recover. Watch your  condition for any changes. Take a warm bath to relieve any burning or pain from frequent diarrhea episodes. Keep all follow-up visits as told by your health care provider. This is important. Contact a health care provider if: You have a fever. Your diarrhea gets worse. You have new symptoms. You cannot keep fluids down. You feel light-headed or dizzy. You have a headache. You have muscle cramps. Get help right away if: You have chest pain. You feel extremely weak or you faint. You have bloody or black stools or stools that look like tar. You have severe pain, cramping, or bloating in your abdomen. You have trouble breathing or you are breathing very quickly. Your heart is beating very quickly. Your skin feels cold and clammy. You feel confused. You have signs of dehydration, such as: Dark urine, very little urine, or no urine. Cracked lips. Dry mouth. Sunken eyes. Sleepiness. Weakness. Summary Diarrhea is frequent loose and sometimes watery bowel movements. Diarrhea can make you feel weak and cause you to become dehydrated. Drink enough fluids to keep your urine pale yellow. Make sure that you wash your hands after using the toilet. If soap and water are not available, use hand sanitizer. Contact a health care provider if your diarrhea gets worse or you have new symptoms. Get help right away if you have signs of dehydration. This information is not intended to replace advice given to you by your health care provider. Make sure you discuss any questions you have with your health care provider. Document Revised: 05/31/2021 Document Reviewed: 09/19/2020 Elsevier Patient Education  2023 Elsevier Inc.  

## 2021-12-17 DIAGNOSIS — R197 Diarrhea, unspecified: Secondary | ICD-10-CM | POA: Diagnosis not present

## 2021-12-17 LAB — TISSUE TRANSGLUTAMINASE ABS,IGG,IGA
(tTG) Ab, IgA: <1 U/mL
(tTG) Ab, IgG: <1 U/mL

## 2021-12-18 ENCOUNTER — Other Ambulatory Visit: Payer: Self-pay | Admitting: Internal Medicine

## 2021-12-18 DIAGNOSIS — L219 Seborrheic dermatitis, unspecified: Secondary | ICD-10-CM

## 2021-12-18 MED ORDER — KETOCONAZOLE 2 % EX SHAM: 1.0000 | MEDICATED_SHAMPOO | CUTANEOUS | 0 refills | Status: DC

## 2021-12-19 DIAGNOSIS — K580 Irritable bowel syndrome with diarrhea: Secondary | ICD-10-CM | POA: Insufficient documentation

## 2021-12-19 LAB — FECAL LACTOFERRIN, QUANT: LACTOFERRIN, QL, STOOL: NEGATIVE

## 2021-12-19 MED ORDER — RIFAXIMIN 550 MG PO TABS: 550.0000 mg | ORAL_TABLET | Freq: Three times a day (TID) | ORAL | 0 refills | Status: AC

## 2021-12-20 ENCOUNTER — Telehealth: Payer: Self-pay

## 2021-12-20 NOTE — Telephone Encounter (Signed)
Approved 12/20/21  - 01/03/22  Determination sent to scan

## 2021-12-20 NOTE — Telephone Encounter (Signed)
Key: [id number]

## 2021-12-23 LAB — PANCREATIC ELASTASE, FECAL: Pancreatic Elastase-1, Stool: >500 mcg/g

## 2021-12-27 LAB — CALPROTECTIN: Calprotectin: 58 mcg/g

## 2022-01-06 ENCOUNTER — Other Ambulatory Visit: Payer: Self-pay | Admitting: Internal Medicine

## 2022-01-06 DIAGNOSIS — N400 Enlarged prostate without lower urinary tract symptoms: Secondary | ICD-10-CM

## 2022-01-07 ENCOUNTER — Other Ambulatory Visit: Payer: Self-pay | Admitting: Internal Medicine

## 2022-01-07 DIAGNOSIS — I10 Essential (primary) hypertension: Secondary | ICD-10-CM

## 2022-01-14 ENCOUNTER — Ambulatory Visit (INDEPENDENT_AMBULATORY_CARE_PROVIDER_SITE_OTHER): Payer: PPO

## 2022-01-14 DIAGNOSIS — Z23 Encounter for immunization: Secondary | ICD-10-CM | POA: Diagnosis not present

## 2022-01-14 NOTE — Progress Notes (Signed)
Pt received flu vaccine w/o any complications.  

## 2022-01-15 DIAGNOSIS — Z961 Presence of intraocular lens: Secondary | ICD-10-CM | POA: Diagnosis not present

## 2022-01-15 DIAGNOSIS — H5213 Myopia, bilateral: Secondary | ICD-10-CM | POA: Diagnosis not present

## 2022-01-21 DIAGNOSIS — D485 Neoplasm of uncertain behavior of skin: Secondary | ICD-10-CM | POA: Diagnosis not present

## 2022-01-21 DIAGNOSIS — D1801 Hemangioma of skin and subcutaneous tissue: Secondary | ICD-10-CM | POA: Diagnosis not present

## 2022-01-21 DIAGNOSIS — D2271 Melanocytic nevi of right lower limb, including hip: Secondary | ICD-10-CM | POA: Diagnosis not present

## 2022-01-21 DIAGNOSIS — C44529 Squamous cell carcinoma of skin of other part of trunk: Secondary | ICD-10-CM | POA: Diagnosis not present

## 2022-01-21 DIAGNOSIS — L738 Other specified follicular disorders: Secondary | ICD-10-CM | POA: Diagnosis not present

## 2022-01-21 DIAGNOSIS — D224 Melanocytic nevi of scalp and neck: Secondary | ICD-10-CM | POA: Diagnosis not present

## 2022-01-21 DIAGNOSIS — D2272 Melanocytic nevi of left lower limb, including hip: Secondary | ICD-10-CM | POA: Diagnosis not present

## 2022-01-21 DIAGNOSIS — L812 Freckles: Secondary | ICD-10-CM | POA: Diagnosis not present

## 2022-01-21 DIAGNOSIS — D225 Melanocytic nevi of trunk: Secondary | ICD-10-CM | POA: Diagnosis not present

## 2022-01-21 DIAGNOSIS — L821 Other seborrheic keratosis: Secondary | ICD-10-CM | POA: Diagnosis not present

## 2022-03-11 DIAGNOSIS — C44529 Squamous cell carcinoma of skin of other part of trunk: Secondary | ICD-10-CM | POA: Diagnosis not present

## 2022-03-11 DIAGNOSIS — Z85828 Personal history of other malignant neoplasm of skin: Secondary | ICD-10-CM | POA: Diagnosis not present

## 2022-03-20 ENCOUNTER — Ambulatory Visit (INDEPENDENT_AMBULATORY_CARE_PROVIDER_SITE_OTHER): Payer: PPO | Admitting: Internal Medicine

## 2022-03-20 ENCOUNTER — Encounter: Payer: Self-pay | Admitting: Internal Medicine

## 2022-03-20 VITALS — BP 142/76 | HR 73 | Temp 98.0°F | Resp 16 | Ht 68.0 in | Wt 158.0 lb

## 2022-03-20 DIAGNOSIS — I10 Essential (primary) hypertension: Secondary | ICD-10-CM

## 2022-03-20 NOTE — Progress Notes (Signed)
Subjective:  Patient ID: Anthony Collins, male    DOB: [birth year]  Age: 76 y.o. MRN: [id number]  CC: Hypertension   HPI Anthony Collins presents for f/up -  He walks 2 miles a day.  His endurance is good.  He denies chest pain, shortness of breath, diaphoresis, or edema.  Outpatient Medications Prior to Visit  Medication Sig Dispense Refill   Bacillus Coagulans-Inulin (PROBIOTIC FORMULA PO) Take by mouth.     Cholecalciferol (VITAMIN D3) 1000 UNITS CAPS Take 1 capsule by mouth daily.     clotrimazole-betamethasone (LOTRISONE) cream APPLY TO AFFECTED AREA TWICE A DAY 45 g 3   cyanocobalamin 1000 MCG tablet Take 1,000 mcg by mouth daily.     finasteride (PROSCAR) 5 MG tablet TAKE 1 TABLET (5 MG TOTAL) BY MOUTH DAILY. 90 tablet 1   irbesartan (AVAPRO) 150 MG tablet TAKE 1 TABLET BY MOUTH EVERY DAY 90 tablet 0   ketoconazole (NIZORAL) 2 % shampoo Apply 1 Application topically every other day. 240 mL 0   Lutein-Zeaxanthin 20-1 MG CAPS Take 1 capsule by mouth daily.     metoprolol succinate (TOPROL-XL) 25 MG 24 hr tablet Take 1 tablet (25 mg total) by mouth every evening. 90 tablet 1   omeprazole (PRILOSEC) 40 MG capsule Take 1 capsule (40 mg total) by mouth daily as needed (heart burn). 90 capsule 3   tamsulosin (FLOMAX) 0.4 MG CAPS capsule TAKE 1 CAPSULE BY MOUTH EVERY DAY 90 capsule 1   No facility-administered medications prior to visit.    ROS Review of Systems  Constitutional: Negative.  Negative for chills, diaphoresis, fatigue and fever.  HENT: Negative.    Eyes: Negative.   Respiratory:  Negative for cough, chest tightness, shortness of breath and wheezing.   Cardiovascular:  Negative for chest pain, palpitations and leg swelling.  Gastrointestinal:  Negative for abdominal pain, diarrhea, nausea and vomiting.  Endocrine: Negative.   Genitourinary:  Negative for difficulty urinating.  Musculoskeletal: Negative.  Negative for arthralgias and myalgias.  Skin: Negative.    Allergic/Immunologic: Negative.   Neurological: Negative.  Negative for dizziness and weakness.  Hematological:  Negative for adenopathy. Does not bruise/bleed easily.  Psychiatric/Behavioral: Negative.      Objective:  BP (!) 142/76 (BP Location: Right Arm, Patient Position: Sitting, Cuff Size: Large)   Pulse 73   Temp 98 F (36.7 C) (Oral)   Resp 16   Ht '5\' 8"'$  (1.727 m)   Wt 158 lb (71.7 kg)   SpO2 97%   BMI 24.02 kg/m   BP Readings from Last 3 Encounters:  03/20/22 (!) 142/76  12/16/21 (!) 146/82  06/12/21 (!) 164/82    Wt Readings from Last 3 Encounters:  03/20/22 158 lb (71.7 kg)  12/16/21 156 lb (70.8 kg)  06/12/21 158 lb (71.7 kg)    Physical Exam Vitals reviewed.  HENT:     Nose: Nose normal.     Mouth/Throat:     Mouth: Mucous membranes are moist.  Eyes:     General: No scleral icterus.    Conjunctiva/sclera: Conjunctivae normal.  Cardiovascular:     Rate and Rhythm: Normal rate and regular rhythm.     Heart sounds: No murmur heard. Pulmonary:     Effort: Pulmonary effort is normal.     Breath sounds: No stridor. No wheezing, rhonchi or rales.  Abdominal:     General: Abdomen is flat.     Palpations: There is no mass.     Tenderness: There  is no abdominal tenderness. There is no guarding.     Hernia: No hernia is present.  Musculoskeletal:        General: Normal range of motion.     Cervical back: Neck supple.     Right lower leg: No edema.     Left lower leg: No edema.  Lymphadenopathy:     Cervical: No cervical adenopathy.  Skin:    General: Skin is warm and dry.  Neurological:     General: No focal deficit present.     Mental Status: He is alert.  Psychiatric:        Mood and Affect: Mood normal.        Behavior: Behavior normal.     Lab Results  Component Value Date   WBC 4.5 12/16/2021   HGB 13.8 12/16/2021   HCT 39.7 12/16/2021   PLT 238.0 12/16/2021   GLUCOSE 105 (H) 12/16/2021   CHOL 169 06/12/2021   TRIG 84.0 06/12/2021    HDL 66.90 06/12/2021   LDLCALC 85 06/12/2021   ALT 18 06/12/2021   AST 22 06/12/2021   NA 140 12/16/2021   K 4.3 12/16/2021   CL 103 12/16/2021   CREATININE 1.02 12/16/2021   BUN 16 12/16/2021   CO2 28 12/16/2021   TSH 2.70 06/12/2021   PSA 0.49 06/12/2021   HGBA1C 5.2 01/13/2017    CT CARDIAC SCORING (SELF PAY ONLY)  Addendum Date: 07/16/2020   ADDENDUM REPORT: 07/16/2020 12:37 CLINICAL DATA:  Cardiovascular Disease Risk stratification EXAM: Coronary Calcium Score TECHNIQUE: A gated, non-contrast computed tomography scan of the heart was performed using 68m slice thickness. Axial images were analyzed on a dedicated workstation. Calcium scoring of the coronary arteries was performed using the Agatston method. FINDINGS: Coronary Calcium Score: Left main: 0 Left anterior descending artery: 0 Left circumflex artery: 0 Right coronary artery: 0 Total: 0 Percentile: NA Pericardium: Normal. Ascending Aorta: Normal caliber. [facility]cardiac: [facility] IMPRESSION: Coronary calcium score of 0. RECOMMENDATIONS: Coronary artery calcium (CAC) score is a strong predictor of incident coronary heart disease (CHD) and provides predictive information beyond traditional risk factors. CAC scoring is reasonable to use in the decision to withhold, postpone, or initiate statin therapy in intermediate-risk or selected borderline-risk asymptomatic adults (age 76-75years and LDL-C >=70 to <190 mg/dL) who do not have diabetes or established atherosclerotic cardiovascular disease (ASCVD).* In intermediate-risk (10-year ASCVD risk >=7.5% to <20%) adults or selected borderline-risk (10-year ASCVD risk >=5% to <7.5%) adults in whom a CAC score is measured for the purpose of making a treatment decision the following recommendations have been made: If CAC=0, it is reasonable to withhold statin therapy and reassess in 5 to 10 years, as long as higher risk conditions are absent (diabetes mellitus,  family history of premature CHD in first degree relatives (males <55 years; females <65 years), cigarette smoking, or LDL >=190 mg/dL). If CAC is 1 to 99, it is reasonable to initiate statin therapy for patients >=51years of age. If CAC is >=100 or >=75th percentile, it is reasonable to initiate statin therapy at any age. Cardiology referral should be considered for patients with CAC scores >=400 or >=75th percentile. *2018 AHA/ACC/AACVPR/AAPA/ABC/ACPM/ADA/AGS/APhA/ASPC/NLA/PCNA Guideline on the Management of Blood Cholesterol: A Report of the American College of Cardiology/American Heart Association Task Force on Clinical Practice Guidelines. J Am Coll Cardiol. 2019;73(24):3168-3209. WEleonore Chiquito MD Electronically Signed   By: WEleonore Chiquito  On: 07/16/2020 12:37   Result Date: 07/16/2020 EXAM:  OVER-READ INTERPRETATION  CT CHEST The following report is an over-read performed by radiologist Dr. Vinnie Langton of [facility], [place] on 07/16/2020. This over-read does not include interpretation of cardiac or coronary anatomy or pathology. The coronary calcium score interpretation by the cardiologist is attached. COMPARISON:  None. FINDINGS: 3 mm right lower lobe pulmonary nodule (axial image 7 of series 3). Within the visualized portions of the thorax there are no other larger more suspicious appearing pulmonary nodules or masses, there is no acute consolidative airspace disease, no pleural effusions, no pneumothorax and no lymphadenopathy. Visualized portions of the upper abdomen are unremarkable. There are no aggressive appearing lytic or blastic lesions noted in the visualized portions of the skeleton. IMPRESSION: 1. 3 mm right lower lobe pulmonary nodule, nonspecific, but statistically likely benign. No follow-up needed if patient is low-risk. Non-contrast chest CT can be considered in 12 months if patient is high-risk. This recommendation follows the consensus statement: Guidelines for Management of  Incidental Pulmonary Nodules Detected on CT Images: From the Fleischner Society 2017; Radiology 2017; 284:228-243. Electronically Signed: By: Vinnie Langton M.D. On: 07/16/2020 10:08    Assessment & Plan:   Anthony Collins was seen today for hypertension.  Diagnoses and all orders for this visit:  Essential hypertension- His blood pressure is adequately well-controlled.  Will continue the current antihypertensives.   I am having Anthony Collins. Anthony Collins maintain his Vitamin D3, Bacillus Coagulans-Inulin (PROBIOTIC FORMULA PO), cyanocobalamin, Lutein-Zeaxanthin, omeprazole, metoprolol succinate, clotrimazole-betamethasone, ketoconazole, tamsulosin, finasteride, and irbesartan.  No orders of the defined types were placed in this encounter.    Follow-up: Return in about 4 months (around 07/20/2022).  Scarlette Calico, MD

## 2022-03-20 NOTE — Patient Instructions (Signed)
Hypertension, Adult High blood pressure (hypertension) is when the force of blood pumping through the arteries is too strong. The arteries are the blood vessels that carry blood from the heart throughout the body. Hypertension forces the heart to work harder to pump blood and may cause arteries to become narrow or stiff. Untreated or uncontrolled hypertension can lead to a heart attack, heart failure, a stroke, kidney disease, and other problems. A blood pressure reading consists of a higher number over a lower number. Ideally, your blood pressure should be below 120/80. The first ("top") number is called the systolic pressure. It is a measure of the pressure in your arteries as your heart beats. The second ("bottom") number is called the diastolic pressure. It is a measure of the pressure in your arteries as the heart relaxes. What are the causes? The exact cause of this condition is not known. There are some conditions that result in high blood pressure. What increases the risk? Certain factors may make you more likely to develop high blood pressure. Some of these risk factors are under your control, including: Smoking. Not getting enough exercise or physical activity. Being overweight. Having too much fat, sugar, calories, or salt (sodium) in your diet. Drinking too much alcohol. Other risk factors include: Having a personal history of heart disease, diabetes, high cholesterol, or kidney disease. Stress. Having a family history of high blood pressure and high cholesterol. Having obstructive sleep apnea. Age. The risk increases with age. What are the signs or symptoms? High blood pressure may not cause symptoms. Very high blood pressure (hypertensive crisis) may cause: Headache. Fast or irregular heartbeats (palpitations). Shortness of breath. Nosebleed. Nausea and vomiting. Vision changes. Severe chest pain, dizziness, and seizures. How is this diagnosed? This condition is diagnosed by  measuring your blood pressure while you are seated, with your arm resting on a flat surface, your legs uncrossed, and your feet flat on the floor. The cuff of the blood pressure monitor will be placed directly against the skin of your upper arm at the level of your heart. Blood pressure should be measured at least twice using the same arm. Certain conditions can cause a difference in blood pressure between your right and left arms. If you have a high blood pressure reading during one visit or you have normal blood pressure with other risk factors, you may be asked to: Return on a different day to have your blood pressure checked again. Monitor your blood pressure at home for 1 week or longer. If you are diagnosed with hypertension, you may have other blood or imaging tests to help your health care provider understand your overall risk for other conditions. How is this treated? This condition is treated by making healthy lifestyle changes, such as eating healthy foods, exercising more, and reducing your alcohol intake. You may be referred for counseling on a healthy diet and physical activity. Your health care provider may prescribe medicine if lifestyle changes are not enough to get your blood pressure under control and if: Your systolic blood pressure is above 130. Your diastolic blood pressure is above 80. Your personal target blood pressure may vary depending on your medical conditions, your age, and other factors. Follow these instructions at home: Eating and drinking  Eat a diet that is high in fiber and potassium, and low in sodium, added sugar, and fat. An example of this eating plan is called the DASH diet. DASH stands for Dietary Approaches to Stop Hypertension. To eat this way: Eat   plenty of fresh fruits and vegetables. Try to fill one half of your plate at each meal with fruits and vegetables. Eat whole grains, such as whole-wheat pasta, brown rice, or whole-grain bread. Fill about one  fourth of your plate with whole grains. Eat or drink low-fat dairy products, such as skim milk or low-fat yogurt. Avoid fatty cuts of meat, processed or cured meats, and poultry with skin. Fill about one fourth of your plate with lean proteins, such as fish, chicken without skin, beans, eggs, or tofu. Avoid pre-made and processed foods. These tend to be higher in sodium, added sugar, and fat. Reduce your daily sodium intake. Many people with hypertension should eat less than 1,500 mg of sodium a day. Do not drink alcohol if: Your health care provider tells you not to drink. You are pregnant, may be pregnant, or are planning to become pregnant. If you drink alcohol: Limit how much you have to: 0-1 drink a day for women. 0-2 drinks a day for men. Know how much alcohol is in your drink. In the [place] one drink equals one 12 oz bottle of beer (355 mL), one 5 oz glass of wine (148 mL), or one 1 oz glass of hard liquor (44 mL). Lifestyle  Work with your health care provider to maintain a healthy body weight or to lose weight. Ask what an ideal weight is for you. Get at least 30 minutes of exercise that causes your heart to beat faster (aerobic exercise) most days of the week. Activities may include walking, swimming, or biking. Include exercise to strengthen your muscles (resistance exercise), such as Pilates or lifting weights, as part of your weekly exercise routine. Try to do these types of exercises for 30 minutes at least 3 days a week. Do not use any products that contain nicotine or tobacco. These products include cigarettes, chewing tobacco, and vaping devices, such as e-cigarettes. If you need help quitting, ask your health care provider. Monitor your blood pressure at home as told by your health care provider. Keep all follow-up visits. This is important. Medicines Take over-the-counter and prescription medicines only as told by your health care provider. Follow directions carefully. Blood  pressure medicines must be taken as prescribed. Do not skip doses of blood pressure medicine. Doing this puts you at risk for problems and can make the medicine less effective. Ask your health care provider about side effects or reactions to medicines that you should watch for. Contact a health care provider if you: Think you are having a reaction to a medicine you are taking. Have headaches that keep coming back (recurring). Feel dizzy. Have swelling in your ankles. Have trouble with your vision. Get help right away if you: Develop a severe headache or confusion. Have unusual weakness or numbness. Feel faint. Have severe pain in your chest or abdomen. Vomit repeatedly. Have trouble breathing. These symptoms may be an emergency. Get help right away. Call 911. Do not wait to see if the symptoms will go away. Do not drive yourself to the hospital. Summary Hypertension is when the force of blood pumping through your arteries is too strong. If this condition is not controlled, it may put you at risk for serious complications. Your personal target blood pressure may vary depending on your medical conditions, your age, and other factors. For most people, a normal blood pressure is less than 120/80. Hypertension is treated with lifestyle changes, medicines, or a combination of both. Lifestyle changes include losing weight, eating a healthy,   low-sodium diet, exercising more, and limiting alcohol. This information is not intended to replace advice given to you by your health care provider. Make sure you discuss any questions you have with your health care provider. Document Revised: 01/15/2021 Document Reviewed: 01/15/2021 Elsevier Patient Education  2023 [employer]  

## 2022-04-03 ENCOUNTER — Other Ambulatory Visit: Payer: Self-pay | Admitting: Internal Medicine

## 2022-04-03 DIAGNOSIS — I10 Essential (primary) hypertension: Secondary | ICD-10-CM

## 2022-04-05 ENCOUNTER — Other Ambulatory Visit: Payer: Self-pay | Admitting: Internal Medicine

## 2022-04-05 DIAGNOSIS — I10 Essential (primary) hypertension: Secondary | ICD-10-CM

## 2022-06-16 DIAGNOSIS — R69 Illness, unspecified: Secondary | ICD-10-CM | POA: Diagnosis not present

## 2022-06-26 ENCOUNTER — Ambulatory Visit: Payer: Medicare HMO

## 2022-06-26 VITALS — Ht 68.0 in | Wt 159.5 lb

## 2022-06-26 DIAGNOSIS — Z0000 Encounter for general adult medical examination without abnormal findings: Secondary | ICD-10-CM

## 2022-06-26 NOTE — Progress Notes (Signed)
I connected with  EZIO KIPLINGER on 06/26/22 by a audio enabled telemedicine application and verified that I am speaking with the correct person using two identifiers.  Patient Location: Home  Provider Location: Office/Clinic  I discussed the limitations of evaluation and management by telemedicine. The patient expressed understanding and agreed to proceed.  Subjective:   Anthony Collins is a 77 y.o. male who presents for Medicare Annual/Subsequent preventive examination.  Review of Systems     Cardiac Risk Factors include: advanced age (>63men, >75 women);dyslipidemia;family history of premature cardiovascular disease;hypertension;male gender     Objective:    Today's Vitals   06/26/22 1048 06/26/22 1055  Weight: 159 lb 8 oz (72.3 kg)   Height: 5\' 8"  (1.727 m)   PainSc: 0-No pain 0-No pain   Body mass index is 24.25 kg/m.     06/26/2022   10:50 AM 06/24/2021    1:31 PM 06/13/2020    9:08 AM 06/06/2020    4:13 PM 05/23/2019    8:26 AM 11/26/2017   10:11 AM 05/04/2017    6:43 AM  Advanced Directives  Does Patient Have a Medical Advance Directive? Yes Yes Yes Yes Yes Yes Yes  Type of Paramedic of [place];Living will [place];Living will [place];Living will [place];Living will [place];Living will [place];Living will [place];Living will  Does patient want to make changes to medical advance directive?   No - Patient declined No - Patient declined Yes (ED - Information included in AVS)  No - Patient declined  Copy of [place] in Chart? No - copy requested No - copy requested Yes - validated most recent copy scanned in chart (See row information)  No - copy requested No - copy requested No - copy requested    Current Medications (verified) Outpatient Encounter Medications as of 06/26/2022  Medication Sig   Bacillus  Coagulans-Inulin (PROBIOTIC FORMULA PO) Take by mouth.   Cholecalciferol (VITAMIN D3) 1000 UNITS CAPS Take 1 capsule by mouth daily.   clotrimazole-betamethasone (LOTRISONE) cream APPLY TO AFFECTED AREA TWICE A DAY   cyanocobalamin 1000 MCG tablet Take 1,000 mcg by mouth daily.   finasteride (PROSCAR) 5 MG tablet TAKE 1 TABLET (5 MG TOTAL) BY MOUTH DAILY.   irbesartan (AVAPRO) 150 MG tablet TAKE 1 TABLET BY MOUTH EVERY DAY   Lutein-Zeaxanthin 20-1 MG CAPS Take 1 capsule by mouth daily.   metoprolol succinate (TOPROL-XL) 25 MG 24 hr tablet TAKE 1 TABLET BY MOUTH EVERY EVENING   omeprazole (PRILOSEC) 40 MG capsule Take 1 capsule (40 mg total) by mouth daily as needed (heart burn).   tamsulosin (FLOMAX) 0.4 MG CAPS capsule TAKE 1 CAPSULE BY MOUTH EVERY DAY   [DISCONTINUED] ketoconazole (NIZORAL) 2 % shampoo Apply 1 Application topically every other day.   No facility-administered encounter medications on file as of 06/26/2022.    Allergies (verified) Patient has no known allergies.   History: Past Medical History:  Diagnosis Date   Bladder calculus    GERD (gastroesophageal reflux disease)    History of kidney stones    Hypertension    Spinal stenosis    Urgency of urination    Wears glasses    Past Surgical History:  Procedure Laterality Date   BLEPHAROPLASTY     EXTRACORPOREAL SHOCK WAVE LITHOTRIPSY  2004   EYE SURGERY     INGUINAL HERNIA REPAIR Right 05/04/2017   Procedure:  OPEN RIGHT INGUINAL HERNIA REPAIR WITH MESH;  Surgeon: Judeth Horn, MD;  Location: [facility];  Service: General;  Laterality: Right;   INGUINAL HERNIA REPAIR Left 1998  (spinal anesthesia)   INGUINAL HERNIA REPAIR Bilateral 06/13/2020   Procedure: LAPAROSCOPIC RIGHT INGUINAL HERNIA REPAIR WITH MESH;  Surgeon: Erroll Luna, MD;  Location: [facility];  Service: General;  Laterality: Bilateral;  ROOM 8 STARTING AT 11:00AM FOR 120 MIN   INSERTION OF MESH Right 05/04/2017    Procedure: INSERTION OF MESH;  Surgeon: Judeth Horn, MD;  Location: [facility];  Service: General;  Laterality: Right;   TONSILLECTOMY  age 42   Family History  Problem Relation Age of Onset   Breast cancer Mother    Hypertension Mother    Lung cancer Father    Colon cancer Neg Hx    Esophageal cancer Neg Hx    Pancreatic cancer Neg Hx    Stomach cancer Neg Hx    Social History   Socioeconomic History   Marital status: Married    Spouse name: Not on file   Number of children: Not on file   Years of education: 16   Highest education level: Not on file  Occupational History   Occupation: Retired   Tobacco Use   Smoking status: Never   Smokeless tobacco: Never  Vaping Use   Vaping Use: Never used  Substance and Sexual Activity   Alcohol use: Yes    Alcohol/week: 14.0 standard drinks of alcohol    Types: 14 Glasses of wine per week    Comment: 2 wine daily   Drug use: No   Sexual activity: Yes    Partners: Female  Other Topics Concern   Not on file  Social History Narrative   Fun/Hobby: Exercise, Teacher, English as a foreign language, 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   Social Determinants of Health   Financial Resource Strain: Low Risk  (06/26/2022)   Overall Financial Resource Strain (CARDIA)    Difficulty of Paying Living Expenses: Not hard at all  Food Insecurity: No Food Insecurity (06/26/2022)   Hunger Vital Sign    Worried About Running Out of Food in the Last Year: Never true    [place] in the Last Year: Never true  Transportation Needs: No Transportation Needs (06/26/2022)   PRAPARE - Hydrologist (Medical): No    Lack of Transportation (Non-Medical): No  Physical Activity: Sufficiently Active (06/26/2022)   Exercise Vital Sign    Days of Exercise per Week: 5 days    Minutes of Exercise per Session: 40 min  Stress: No Stress Concern Present (06/26/2022)   [place]     Feeling of Stress : Only a little  Social Connections: Unknown (06/26/2022)   Social Connection and Isolation Panel [NHANES]    Frequency of Communication with Friends and Family: More than three times a week    Frequency of Social Gatherings with Friends and Family: More than three times a week    Attends Religious Services: Not on Advertising copywriter or Organizations: No    Attends Music therapist: 1 to 4 times per year    Marital Status: Married    Tobacco Counseling Counseling given: Not Answered   Clinical Intake:  Pre-visit preparation completed: Yes  Pain : No/denies pain Pain Score: 0-No pain     BMI - recorded: 24.25 Nutritional Status:  BMI of 19-24  Normal Nutritional Risks: None Diabetes: No  How often do you need to have someone help you when you read instructions, pamphlets, or other written materials from your doctor or pharmacy?: 1 - Never What is the last grade level you completed in school?: HSG  Diabetic? No  Interpreter Needed?: No  Information entered by :: Lisette Abu, LPN.   Activities of Daily Living    06/26/2022   10:59 AM 06/25/2022    8:57 AM  In your present state of health, do you have any difficulty performing the following activities:  Hearing? 0 0  Vision? 0 0  Difficulty concentrating or making decisions? 0 0  Walking or climbing stairs? 0 0  Dressing or bathing? 0 0  Doing errands, shopping? 0 0  Preparing Food and eating ? N N  Using the Toilet? N N  In the past six months, have you accidently leaked urine? N N  Do you have problems with loss of bowel control? N N  Managing your Medications? N N  Managing your Finances? N N  Housekeeping or managing your Housekeeping? N N    Patient Care Team: Janith Lima, MD as PCP - General (Internal Medicine) Nickie Retort, MD (Inactive) as Consulting Physician (Urology) Jarome Matin, MD as Consulting Physician (Dermatology) Luberta Mutter, MD as  Consulting Physician (Ophthalmology) Royston Cowper, DDS (Dentistry) Lorenda Cahill, DMD (Dentistry) Marybelle Killings, MD as Consulting Physician (Orthopedic Surgery) Madelin Rear, [facility] (Inactive) as Pharmacist (Pharmacist)  Indicate any recent Medical Services you may have received from other than [facility] providers in the past year (date may be approximate).     Assessment:   This is a routine wellness examination for [place].  Hearing/Vision screen Hearing Screening - Comments:: Denies hearing difficulties   Vision Screening - Comments:: Cataracts removed; no eyeglasses- up to date with routine eye exams with Luberta Mutter, MD.   Dietary issues and exercise activities discussed: Current Exercise Habits: Home exercise routine, Type of exercise: walking (loves the outdoors, playing with grandkids, staying active), Time (Minutes): 40, Frequency (Times/Week): 5, Weekly Exercise (Minutes/Week): 200, Intensity: Moderate, Exercise limited by: None identified   Goals Addressed             This Visit's Progress    My goal for 2024 is to maintain my health, stay independent and active.  I feel very blessed.        Depression Screen    06/26/2022   10:52 AM 06/24/2021    1:32 PM 06/24/2021    1:29 PM 06/12/2021    9:50 AM 06/11/2020    8:26 AM 05/23/2019    8:27 AM 05/24/2018    8:30 AM  PHQ 2/9 Scores  PHQ - 2 Score 0 0 0 0 0 0 0  PHQ- 9 Score 0      0    Fall Risk    06/26/2022   10:59 AM 06/25/2022    8:57 AM 06/24/2021    1:32 PM 12/12/2020   10:34 AM 09/05/2019    8:55 AM  Fall Risk   Falls in the past year? 0 0 0 0 0  Number falls in past yr: 0  0  0  Injury with Fall? 0  0  0  Risk for fall due to : No Fall Risks      Follow up Falls prevention discussed  Falls evaluation completed      FALL RISK PREVENTION PERTAINING TO THE HOME:  Any stairs  in or around the home? Yes ; there is an Media planner. Patient lives on 3rd floor of condo building. If so, are there any without handrails? No   Home free of loose throw rugs in walkways, pet beds, electrical cords, etc? Yes  Adequate lighting in your home to reduce risk of falls? Yes   ASSISTIVE DEVICES UTILIZED TO PREVENT FALLS:  Life alert? No  Use of a cane, walker or w/c? No  Grab bars in the bathroom? Yes  Shower chair or bench in shower? No  Elevated toilet seat or a handicapped toilet? No   TIMED UP AND GO:  Was the test performed? No . Telephonic Visit   Cognitive Function:    05/23/2019    9:21 AM 11/26/2017   10:12 AM  MMSE - Mini Mental State Exam  Orientation to time 5 5  Orientation to Place 5 5  Registration 3 3  Attention/ Calculation 5 3  Recall 3 3  Language- name 2 objects 2 2  Language- repeat 1 1  Language- follow 3 step command 3 3  Language- read & follow direction 1 1  Write a sentence 1 1  Copy design 1 1  Total score 30 28        06/26/2022   11:00 AM  6CIT Screen  What Year? 0 points  What month? 0 points  What time? 0 points  Count back from 20 0 points  Months in reverse 0 points  Repeat phrase 0 points  Total Score 0 points    Immunizations Immunization History  Administered Date(s) Administered   Fluad Quad(high Dose 65+) 01/25/2019, 01/23/2020, 01/11/2021, 01/14/2022   Influenza, High Dose Seasonal PF 01/12/2017, 01/25/2018   PFIZER(Purple Top)SARS-COV-2 Vaccination 04/09/2019, 04/30/2019   Pneumococcal Conjugate-13 04/28/2014, 11/26/2017   Pneumococcal Polysaccharide-23 01/16/2012, 06/11/2020   Tdap 06/06/2011, 10/26/2012   Zoster Recombinat (Shingrix) 01/12/2017, 06/10/2017    TDAP status: Up to date  Flu Vaccine status: Up to date  Pneumococcal vaccine status: Up to date  Covid-19 vaccine status: Completed vaccines  Qualifies for Shingles Vaccine? Yes   Zostavax completed Yes   Shingrix Completed?: Yes  Screening Tests Health Maintenance  Topic Date Due   INFLUENZA VACCINE  10/23/2022   DTaP/Tdap/Td (3 - Td or Tdap) 10/27/2022   Medicare Annual  Wellness (AWV)  06/26/2023   Pneumonia Vaccine 31+ Years old  Completed   Hepatitis C Screening  Completed   Zoster Vaccines- Shingrix  Completed   HPV VACCINES  Aged Out   COLONOSCOPY (Pts 45-18yrs Insurance coverage will need to be confirmed)  Discontinued   COVID-19 Vaccine  Discontinued    Health Maintenance  There are no preventive care reminders to display for this patient.   Colorectal cancer screening: No longer required.   Lung Cancer Screening: (Low Dose CT Chest recommended if Age 18-80 years, 30 pack-year currently smoking OR have quit w/in 15years.) does not qualify.   Lung Cancer Screening Referral: no  Additional Screening:  Hepatitis C Screening: does qualify; Completed 06/30/2016  Vision Screening: Recommended annual ophthalmology exams for early detection of glaucoma and other disorders of the eye. Is the patient up to date with their annual eye exam?  Yes  Who is the provider or what is the name of the office in which the patient attends annual eye exams? Luberta Mutter, MD. If pt is not established with a provider, would they like to be referred to a provider to establish care? No .   Dental Screening: Recommended  annual dental exams for proper oral hygiene  Community Resource Referral / Chronic Care Management: CRR required this visit?  No   CCM required this visit?  No      Plan:     I have personally reviewed and noted the following in the patient's chart:   Medical and social history Use of alcohol, tobacco or illicit drugs  Current medications and supplements including opioid prescriptions. Patient is not currently taking opioid prescriptions. Functional ability and status Nutritional status Physical activity Advanced directives List of other physicians Hospitalizations, surgeries, and ER visits in previous 12 months Vitals Screenings to include cognitive, depression, and falls Referrals and appointments  In addition, I have reviewed  and discussed with patient certain preventive protocols, quality metrics, and best practice recommendations. A written personalized care plan for preventive services as well as general preventive health recommendations were provided to patient.     Sheral Flow, LPN   [id number]   Nurse Notes:  Normal cognitive status assessed by direct observation by this Nurse Health Advisor. No abnormalities found.

## 2022-06-26 NOTE — Patient Instructions (Signed)
Anthony Collins , Thank you for taking time to come for your Medicare Wellness Visit. I appreciate your ongoing commitment to your health goals. Please review the following plan we discussed and let me know if I can assist you in the future.   These are the goals we discussed:  Goals      My goal for 2024 is to maintain my health, stay independent and active.  I feel very blessed.        This is a list of the screening recommended for you and due dates:  Health Maintenance  Topic Date Due   Flu Shot  10/23/2022   DTaP/Tdap/Td vaccine (3 - Td or Tdap) 10/27/2022   Medicare Annual Wellness Visit  06/26/2023   Pneumonia Vaccine  Completed   Hepatitis C Screening: USPSTF Recommendation to screen - Ages 18-79 yo.  Completed   Zoster (Shingles) Vaccine  Completed   HPV Vaccine  Aged Out   Colon Cancer Screening  Discontinued   COVID-19 Vaccine  Discontinued    Advanced directives: Yes  Conditions/risks identified: Yes  Next appointment: Follow up in one year for your annual wellness visit.   Preventive Care 33 Years and Older, Male  Preventive care refers to lifestyle choices and visits with your health care provider that can promote health and wellness. What does preventive care include? A yearly physical exam. This is also called an annual well check. Dental exams once or twice a year. Routine eye exams. Ask your health care provider how often you should have your eyes checked. Personal lifestyle choices, including: Daily care of your teeth and gums. Regular physical activity. Eating a healthy diet. Avoiding tobacco and drug use. Limiting alcohol use. Practicing safe sex. Taking low doses of aspirin every day. Taking vitamin and mineral supplements as recommended by your health care provider. What happens during an annual well check? The services and screenings done by your health care provider during your annual well check will depend on your age, overall health, lifestyle risk  factors, and family history of disease. Counseling  Your health care provider may ask you questions about your: Alcohol use. Tobacco use. Drug use. Emotional well-being. Home and relationship well-being. Sexual activity. Eating habits. History of falls. Memory and ability to understand (cognition). Work and work Statistician. Screening  You may have the following tests or measurements: Height, weight, and BMI. Blood pressure. Lipid and cholesterol levels. These may be checked every 5 years, or more frequently if you are over 23 years old. Skin check. Lung cancer screening. You may have this screening every year starting at age 52 if you have a 30-pack-year history of smoking and currently smoke or have quit within the past 15 years. Fecal occult blood test (FOBT) of the stool. You may have this test every year starting at age 65. Flexible sigmoidoscopy or colonoscopy. You may have a sigmoidoscopy every 5 years or a colonoscopy every 10 years starting at age 51. Prostate cancer screening. Recommendations will vary depending on your family history and other risks. Hepatitis C blood test. Hepatitis B blood test. Sexually transmitted disease (STD) testing. Diabetes screening. This is done by checking your blood sugar (glucose) after you have not eaten for a while (fasting). You may have this done every 1-3 years. Abdominal aortic aneurysm (AAA) screening. You may need this if you are a current or former smoker. Osteoporosis. You may be screened starting at age 27 if you are at high risk. Talk with your health care provider  about your test results, treatment options, and if necessary, the need for more tests. Vaccines  Your health care provider may recommend certain vaccines, such as: Influenza vaccine. This is recommended every year. Tetanus, diphtheria, and acellular pertussis (Tdap, Td) vaccine. You may need a Td booster every 10 years. Zoster vaccine. You may need this after age  61. Pneumococcal 13-valent conjugate (PCV13) vaccine. One dose is recommended after age 18. Pneumococcal polysaccharide (PPSV23) vaccine. One dose is recommended after age 22. Talk to your health care provider about which screenings and vaccines you need and how often you need them. This information is not intended to replace advice given to you by your health care provider. Make sure you discuss any questions you have with your health care provider. Document Released: 04/06/2015 Document Revised: 11/28/2015 Document Reviewed: 01/09/2015 Elsevier Interactive Patient Education  2017 [place] Prevention in the Home Falls can cause injuries. They can happen to people of all ages. There are many things you can do to make your home safe and to help prevent falls. What can I do on the outside of my home? Regularly fix the edges of walkways and driveways and fix any cracks. Remove anything that might make you trip as you walk through a door, such as a raised step or threshold. Trim any bushes or trees on the path to your home. Use bright outdoor lighting. Clear any walking paths of anything that might make someone trip, such as rocks or tools. Regularly check to see if handrails are loose or broken. Make sure that both sides of any steps have handrails. Any raised decks and porches should have guardrails on the edges. Have any leaves, snow, or ice cleared regularly. Use sand or salt on walking paths during winter. Clean up any spills in your garage right away. This includes oil or grease spills. What can I do in the bathroom? Use night lights. Install grab bars by the toilet and in the tub and shower. Do not use towel bars as grab bars. Use non-skid mats or decals in the tub or shower. If you need to sit down in the shower, use a plastic, non-slip stool. Keep the floor dry. Clean up any water that spills on the floor as soon as it happens. Remove soap buildup in the tub or shower  regularly. Attach bath mats securely with double-sided non-slip rug tape. Do not have throw rugs and other things on the floor that can make you trip. What can I do in the bedroom? Use night lights. Make sure that you have a light by your bed that is easy to reach. Do not use any sheets or blankets that are too big for your bed. They should not hang down onto the floor. Have a firm chair that has side arms. You can use this for support while you get dressed. Do not have throw rugs and other things on the floor that can make you trip. What can I do in the kitchen? Clean up any spills right away. Avoid walking on wet floors. Keep items that you use a lot in easy-to-reach places. If you need to reach something above you, use a strong step stool that has a grab bar. Keep electrical cords out of the way. Do not use floor polish or wax that makes floors slippery. If you must use wax, use non-skid floor wax. Do not have throw rugs and other things on the floor that can make you trip. What can I do  with my stairs? Do not leave any items on the stairs. Make sure that there are handrails on both sides of the stairs and use them. Fix handrails that are broken or loose. Make sure that handrails are as long as the stairways. Check any carpeting to make sure that it is firmly attached to the stairs. Fix any carpet that is loose or worn. Avoid having throw rugs at the top or bottom of the stairs. If you do have throw rugs, attach them to the floor with carpet tape. Make sure that you have a light switch at the top of the stairs and the bottom of the stairs. If you do not have them, ask someone to add them for you. What else can I do to help prevent falls? Wear shoes that: Do not have high heels. Have rubber bottoms. Are comfortable and fit you well. Are closed at the toe. Do not wear sandals. If you use a stepladder: Make sure that it is fully opened. Do not climb a closed stepladder. Make sure that  both sides of the stepladder are locked into place. Ask someone to hold it for you, if possible. Clearly mark and make sure that you can see: Any grab bars or handrails. First and last steps. Where the edge of each step is. Use tools that help you move around (mobility aids) if they are needed. These include: Canes. Walkers. Scooters. Crutches. Turn on the lights when you go into a dark area. Replace any light bulbs as soon as they burn out. Set up your furniture so you have a clear path. Avoid moving your furniture around. If any of your floors are uneven, fix them. If there are any pets around you, be aware of where they are. Review your medicines with your doctor. Some medicines can make you feel dizzy. This can increase your chance of falling. Ask your doctor what other things that you can do to help prevent falls. This information is not intended to replace advice given to you by your health care provider. Make sure you discuss any questions you have with your health care provider. Document Released: 01/04/2009 Document Revised: 08/16/2015 Document Reviewed: 04/14/2014 Elsevier Interactive Patient Education  2017 [employer].

## 2022-06-28 ENCOUNTER — Other Ambulatory Visit: Payer: Self-pay | Admitting: Internal Medicine

## 2022-06-28 DIAGNOSIS — I10 Essential (primary) hypertension: Secondary | ICD-10-CM

## 2022-06-29 ENCOUNTER — Other Ambulatory Visit: Payer: Self-pay | Admitting: Internal Medicine

## 2022-06-29 DIAGNOSIS — N400 Enlarged prostate without lower urinary tract symptoms: Secondary | ICD-10-CM

## 2022-07-01 ENCOUNTER — Other Ambulatory Visit: Payer: Self-pay | Admitting: Internal Medicine

## 2022-07-01 DIAGNOSIS — I10 Essential (primary) hypertension: Secondary | ICD-10-CM

## 2022-07-17 ENCOUNTER — Ambulatory Visit: Payer: Medicare HMO | Admitting: Internal Medicine

## 2022-07-17 ENCOUNTER — Encounter: Payer: Self-pay | Admitting: Internal Medicine

## 2022-07-17 VITALS — BP 142/78 | HR 75 | Temp 98.1°F | Resp 16 | Ht 68.0 in | Wt 158.0 lb

## 2022-07-17 DIAGNOSIS — Z0001 Encounter for general adult medical examination with abnormal findings: Secondary | ICD-10-CM

## 2022-07-17 DIAGNOSIS — I10 Essential (primary) hypertension: Secondary | ICD-10-CM | POA: Diagnosis not present

## 2022-07-17 DIAGNOSIS — E785 Hyperlipidemia, unspecified: Secondary | ICD-10-CM | POA: Diagnosis not present

## 2022-07-17 DIAGNOSIS — N400 Enlarged prostate without lower urinary tract symptoms: Secondary | ICD-10-CM

## 2022-07-17 DIAGNOSIS — K219 Gastro-esophageal reflux disease without esophagitis: Secondary | ICD-10-CM

## 2022-07-17 LAB — LIPID PANEL
Cholesterol: 168 mg/dL (ref 0–200)
HDL: 59.5 mg/dL (ref >39.00)
LDL Cholesterol: 90 mg/dL (ref 0–99)
NonHDL: 108.24
Total CHOL/HDL Ratio: 3
Triglycerides: 90 mg/dL (ref 0.0–149.0)
VLDL: 18 mg/dL (ref 0.0–40.0)

## 2022-07-17 LAB — HEPATIC FUNCTION PANEL
ALT: 16 U/L (ref 0–53)
AST: 20 U/L (ref 0–37)
Albumin: 4.6 g/dL (ref 3.5–5.2)
Alkaline Phosphatase: 53 U/L (ref 39–117)
Bilirubin, Direct: 0.2 mg/dL (ref 0.0–0.3)
Total Bilirubin: 1 mg/dL (ref 0.2–1.2)
Total Protein: 7.6 g/dL (ref 6.0–8.3)

## 2022-07-17 LAB — URINALYSIS, ROUTINE W REFLEX MICROSCOPIC
Bilirubin Urine: NEGATIVE
Hgb urine dipstick: NEGATIVE
Ketones, ur: NEGATIVE
Leukocytes,Ua: NEGATIVE
Nitrite: NEGATIVE
RBC / HPF: NONE SEEN (ref 0–?)
Specific Gravity, Urine: <=1.005 — AB (ref 1.000–1.030)
Total Protein, Urine: NEGATIVE
Urine Glucose: NEGATIVE
Urobilinogen, UA: 0.2 (ref 0.0–1.0)
WBC, UA: NONE SEEN (ref 0–?)
pH: 6 (ref 5.0–8.0)

## 2022-07-17 LAB — CBC WITH DIFFERENTIAL/PLATELET
Basophils Absolute: 0 10*3/uL (ref 0.0–0.1)
Basophils Relative: 0.4 % (ref 0.0–3.0)
Eosinophils Absolute: 0.1 10*3/uL (ref 0.0–0.7)
Eosinophils Relative: 2.8 % (ref 0.0–5.0)
HCT: 41.7 % (ref 39.0–52.0)
Hemoglobin: 14.5 g/dL (ref 13.0–17.0)
Lymphocytes Relative: 23.3 % (ref 12.0–46.0)
Lymphs Abs: 1.1 10*3/uL (ref 0.7–4.0)
MCHC: 34.8 g/dL (ref 30.0–36.0)
MCV: 92.5 fl (ref 78.0–100.0)
Monocytes Absolute: 0.4 10*3/uL (ref 0.1–1.0)
Monocytes Relative: 8 % (ref 3.0–12.0)
Neutro Abs: 3.2 10*3/uL (ref 1.4–7.7)
Neutrophils Relative %: 65.5 % (ref 43.0–77.0)
Platelets: 276 10*3/uL (ref 150.0–400.0)
RBC: 4.51 Mil/uL (ref 4.22–5.81)
RDW: 12.9 % (ref 11.5–15.5)
WBC: 4.9 10*3/uL (ref 4.0–10.5)

## 2022-07-17 LAB — BASIC METABOLIC PANEL
BUN: 18 mg/dL (ref 6–23)
CO2: 30 mEq/L (ref 19–32)
Calcium: 9.7 mg/dL (ref 8.4–10.5)
Chloride: 102 mEq/L (ref 96–112)
Creatinine, Ser: 1.08 mg/dL (ref 0.40–1.50)
GFR: 66.67 mL/min (ref >60.00)
Glucose, Bld: 102 mg/dL — ABNORMAL HIGH (ref 70–99)
Potassium: 4.6 mEq/L (ref 3.5–5.1)
Sodium: 140 mEq/L (ref 135–145)

## 2022-07-17 LAB — TSH: TSH: 3.04 u[IU]/mL (ref 0.35–5.50)

## 2022-07-17 LAB — PSA: PSA: 0.64 ng/mL (ref 0.10–4.00)

## 2022-07-17 NOTE — Progress Notes (Signed)
Subjective:  Patient ID: Anthony Collins, male    DOB: 1946/03/11  Age: 77 y.o. MRN: [id number]  CC: Annual Exam, Hypertension, and Hyperlipidemia   HPI Anthony Collins presents for a CPX and f/up -----  He is active and denies chest pain, shortness of breath, diaphoresis, or edema.  Outpatient Medications Prior to Visit  Medication Sig Dispense Refill   Bacillus Coagulans-Inulin (PROBIOTIC FORMULA PO) Take by mouth.     Cholecalciferol (VITAMIN D3) 1000 UNITS CAPS Take 1 capsule by mouth daily.     clotrimazole-betamethasone (LOTRISONE) cream APPLY TO AFFECTED AREA TWICE A DAY 45 g 3   cyanocobalamin 1000 MCG tablet Take 1,000 mcg by mouth daily.     finasteride (PROSCAR) 5 MG tablet TAKE 1 TABLET (5 MG TOTAL) BY MOUTH DAILY. 90 tablet 0   irbesartan (AVAPRO) 150 MG tablet TAKE 1 TABLET BY MOUTH EVERY DAY 90 tablet 0   Lutein-Zeaxanthin 20-1 MG CAPS Take 1 capsule by mouth daily.     metoprolol succinate (TOPROL-XL) 25 MG 24 hr tablet TAKE 1 TABLET BY MOUTH EVERY DAY IN THE EVENING 90 tablet 0   omeprazole (PRILOSEC) 40 MG capsule Take 1 capsule (40 mg total) by mouth daily as needed (heart burn). 90 capsule 3   tamsulosin (FLOMAX) 0.4 MG CAPS capsule TAKE 1 CAPSULE BY MOUTH EVERY DAY 90 capsule 0   No facility-administered medications prior to visit.    ROS Review of Systems  Constitutional: Negative.  Negative for diaphoresis and fatigue.  HENT: Negative.    Eyes: Negative.   Respiratory: Negative.  Negative for cough, chest tightness, shortness of breath and wheezing.   Cardiovascular:  Negative for chest pain, palpitations and leg swelling.  Gastrointestinal:  Negative for abdominal pain, constipation, diarrhea, nausea and vomiting.  Endocrine: Positive for cold intolerance and heat intolerance.  Genitourinary: Negative.  Negative for difficulty urinating.  Musculoskeletal: Negative.  Negative for arthralgias and myalgias.  Skin: Negative.  Negative for color change  and pallor.  Neurological: Negative.  Negative for dizziness and weakness.  Hematological:  Negative for adenopathy. Does not bruise/bleed easily.  Psychiatric/Behavioral: Negative.      Objective:  BP (!) 142/78 (BP Location: Left Arm, Patient Position: Sitting, Cuff Size: Large)   Pulse 75   Temp 98.1 F (36.7 C) (Oral)   Resp 16   Ht 5\' 8"  (1.727 m)   Wt 158 lb (71.7 kg)   SpO2 99%   BMI 24.02 kg/m   BP Readings from Last 3 Encounters:  07/17/22 (!) 142/78  03/20/22 (!) 142/76  12/16/21 (!) 146/82    Wt Readings from Last 3 Encounters:  07/17/22 158 lb (71.7 kg)  06/26/22 159 lb 8 oz (72.3 kg)  03/20/22 158 lb (71.7 kg)    Physical Exam Vitals reviewed.  Constitutional:      Appearance: He is not ill-appearing.  HENT:     Nose: Nose normal.     Mouth/Throat:     Mouth: Mucous membranes are moist.  Eyes:     General: No scleral icterus.    Conjunctiva/sclera: Conjunctivae normal.  Cardiovascular:     Rate and Rhythm: Normal rate and regular rhythm.     Pulses:          Carotid pulses are 1+ on the right side and 1+ on the left side.      Radial pulses are 1+ on the right side and 1+ on the left side.       Femoral  pulses are 1+ on the right side and 1+ on the left side.      Popliteal pulses are 1+ on the right side and 1+ on the left side.       Dorsalis pedis pulses are 1+ on the right side and 1+ on the left side.       Posterior tibial pulses are 1+ on the right side and 1+ on the left side.     Heart sounds: No murmur heard.    No gallop.  Pulmonary:     Effort: Pulmonary effort is normal.     Breath sounds: No stridor. No wheezing, rhonchi or rales.  Abdominal:     General: Abdomen is flat.     Palpations: There is no mass.     Tenderness: There is no abdominal tenderness. There is no guarding.     Hernia: No hernia is present.  Musculoskeletal:        General: Normal range of motion.     Cervical back: Neck supple.     Right lower leg: No edema.      Left lower leg: No edema.  Lymphadenopathy:     Cervical: No cervical adenopathy.  Skin:    General: Skin is warm and dry.     Findings: No lesion.  Neurological:     General: No focal deficit present.     Mental Status: He is alert and oriented to person, place, and time.  Psychiatric:        Mood and Affect: Mood normal.        Behavior: Behavior normal.     Lab Results  Component Value Date   WBC 4.9 07/17/2022   HGB 14.5 07/17/2022   HCT 41.7 07/17/2022   PLT 276.0 07/17/2022   GLUCOSE 102 (H) 07/17/2022   CHOL 168 07/17/2022   TRIG 90.0 07/17/2022   HDL 59.50 07/17/2022   LDLCALC 90 07/17/2022   ALT 16 07/17/2022   AST 20 07/17/2022   NA 140 07/17/2022   K 4.6 07/17/2022   CL 102 07/17/2022   CREATININE 1.08 07/17/2022   BUN 18 07/17/2022   CO2 30 07/17/2022   TSH 3.04 07/17/2022   PSA 0.64 07/17/2022   HGBA1C 5.2 01/13/2017    CT CARDIAC SCORING (SELF PAY ONLY)  Addendum Date: 07/16/2020   ADDENDUM REPORT: 07/16/2020 12:37 CLINICAL DATA:  Cardiovascular Disease Risk stratification EXAM: Coronary Calcium Score TECHNIQUE: A gated, non-contrast computed tomography scan of the heart was performed using 3mm slice thickness. Axial images were analyzed on a dedicated workstation. Calcium scoring of the coronary arteries was performed using the Agatston method. FINDINGS: Coronary Calcium Score: Left main: 0 Left anterior descending artery: 0 Left circumflex artery: 0 Right coronary artery: 0 Total: 0 Percentile: NA Pericardium: Normal. Ascending Aorta: Normal caliber. Non-cardiac: See separate report from [facility]. IMPRESSION: Coronary calcium score of 0. RECOMMENDATIONS: Coronary artery calcium (CAC) score is a strong predictor of incident coronary heart disease (CHD) and provides predictive information beyond traditional risk factors. CAC scoring is reasonable to use in the decision to withhold, postpone, or initiate statin therapy in intermediate-risk or  selected borderline-risk asymptomatic adults (age 63-75 years and LDL-C >=70 to <190 mg/dL) who do not have diabetes or established atherosclerotic cardiovascular disease (ASCVD).* In intermediate-risk (10-year ASCVD risk >=7.5% to <20%) adults or selected borderline-risk (10-year ASCVD risk >=5% to <7.5%) adults in whom a CAC score is measured for the purpose of making a treatment decision the following recommendations have  been made: If CAC=0, it is reasonable to withhold statin therapy and reassess in 5 to 10 years, as long as higher risk conditions are absent (diabetes mellitus, family history of premature CHD in first degree relatives (males <55 years; females <65 years), cigarette smoking, or LDL >=190 mg/dL). If CAC is 1 to 99, it is reasonable to initiate statin therapy for patients >=49 years of age. If CAC is >=100 or >=75th percentile, it is reasonable to initiate statin therapy at any age. Cardiology referral should be considered for patients with CAC scores >=400 or >=75th percentile. *2018 AHA/ACC/AACVPR/AAPA/ABC/ACPM/ADA/AGS/APhA/ASPC/NLA/PCNA Guideline on the Management of Blood Cholesterol: A Report of the American College of Cardiology/American Heart Association Task Force on Clinical Practice Guidelines. J Am Coll Cardiol. 2019;73(24):3168-3209. Lennie Odor, MD Electronically Signed   By: Lennie Odor   On: 07/16/2020 12:37   Result Date: 07/16/2020 EXAM: OVER-READ INTERPRETATION  CT CHEST The following report is an over-read performed by radiologist Dr. Trudie Reed of [facility] on 07/16/2020. This over-read does not include interpretation of cardiac or coronary anatomy or pathology. The coronary calcium score interpretation by the cardiologist is attached. COMPARISON:  None. FINDINGS: 3 mm right lower lobe pulmonary nodule (axial image 7 of series 3). Within the visualized portions of the thorax there are no other larger more suspicious appearing pulmonary nodules or  masses, there is no acute consolidative airspace disease, no pleural effusions, no pneumothorax and no lymphadenopathy. Visualized portions of the upper abdomen are unremarkable. There are no aggressive appearing lytic or blastic lesions noted in the visualized portions of the skeleton. IMPRESSION: 1. 3 mm right lower lobe pulmonary nodule, nonspecific, but statistically likely benign. No follow-up needed if patient is low-risk. Non-contrast chest CT can be considered in 12 months if patient is high-risk. This recommendation follows the consensus statement: Guidelines for Management of Incidental Pulmonary Nodules Detected on CT Images: From the Fleischner Society 2017; Radiology 2017; 284:228-243. Electronically Signed: By: Trudie Reed M.D. On: 07/16/2020 10:08    Assessment & Plan:    Encounter for general adult medical examination with abnormal findings- Exam completed, labs reviewed, vaccines are up-to-date, cancer screenings are up-to-date, patient education was given.  Benign prostatic hyperplasia without lower urinary tract symptoms -     Urinalysis, Routine w reflex microscopic; Future -     PSA; Future  Hyperlipidemia with target LDL less than 100- His ASCVD risk score is up to 32%.  I recommended that he take a statin for cardiovascular risk reduction. -     Lipid panel; Future -     Hepatic function panel; Future -     TSH; Future  Essential hypertension- His blood pressure is well-controlled. -     CBC with Differential/Platelet; Future -     Hepatic function panel; Future -     TSH; Future -     Basic metabolic panel; Future  Gastroesophageal reflux disease without esophagitis -     CBC with Differential/Platelet; Future     Follow-up: Return in about 6 months (around 01/16/2023).  Sanda Linger, MD

## 2022-07-17 NOTE — Patient Instructions (Signed)
Health Maintenance, Male Adopting a healthy lifestyle and getting preventive care are important in promoting health and wellness. Ask your health care provider about: The right schedule for you to have regular tests and exams. Things you can do on your own to prevent diseases and keep yourself healthy. What should I know about diet, weight, and exercise? Eat a healthy diet  Eat a diet that includes plenty of vegetables, fruits, low-fat dairy products, and lean protein. Do not eat a lot of foods that are high in solid fats, added sugars, or sodium. Maintain a healthy weight Body mass index (BMI) is a measurement that can be used to identify possible weight problems. It estimates body fat based on height and weight. Your health care provider can help determine your BMI and help you achieve or maintain a healthy weight. Get regular exercise Get regular exercise. This is one of the most important things you can do for your health. Most adults should: Exercise for at least 150 minutes each week. The exercise should increase your heart rate and make you sweat (moderate-intensity exercise). Do strengthening exercises at least twice a week. This is in addition to the moderate-intensity exercise. Spend less time sitting. Even light physical activity can be beneficial. Watch cholesterol and blood lipids Have your blood tested for lipids and cholesterol at 77 years of age, then have this test every 5 years. You may need to have your cholesterol levels checked more often if: Your lipid or cholesterol levels are high. You are older than 77 years of age. You are at high risk for heart disease. What should I know about cancer screening? Many types of cancers can be detected early and may often be prevented. Depending on your health history and family history, you may need to have cancer screening at various ages. This may include screening for: Colorectal cancer. Prostate cancer. Skin cancer. Lung  cancer. What should I know about heart disease, diabetes, and high blood pressure? Blood pressure and heart disease High blood pressure causes heart disease and increases the risk of stroke. This is more likely to develop in people who have high blood pressure readings or are overweight. Talk with your health care provider about your target blood pressure readings. Have your blood pressure checked: Every 3-5 years if you are 18-39 years of age. Every year if you are 40 years old or older. If you are between the ages of 65 and 75 and are a current or former smoker, ask your health care provider if you should have a one-time screening for abdominal aortic aneurysm (AAA). Diabetes Have regular diabetes screenings. This checks your fasting blood sugar level. Have the screening done: Once every three years after age 45 if you are at a normal weight and have a low risk for diabetes. More often and at a younger age if you are overweight or have a high risk for diabetes. What should I know about preventing infection? Hepatitis B If you have a higher risk for hepatitis B, you should be screened for this virus. Talk with your health care provider to find out if you are at risk for hepatitis B infection. Hepatitis C Blood testing is recommended for: Everyone born from 1945 through 1965. Anyone with known risk factors for hepatitis C. Sexually transmitted infections (STIs) You should be screened each year for STIs, including gonorrhea and chlamydia, if: You are sexually active and are younger than 77 years of age. You are older than 77 years of age and your   health care provider tells you that you are at risk for this type of infection. Your sexual activity has changed since you were last screened, and you are at increased risk for chlamydia or gonorrhea. Ask your health care provider if you are at risk. Ask your health care provider about whether you are at high risk for HIV. Your health care provider  may recommend a prescription medicine to help prevent HIV infection. If you choose to take medicine to prevent HIV, you should first get tested for HIV. You should then be tested every 3 months for as long as you are taking the medicine. Follow these instructions at home: Alcohol use Do not drink alcohol if your health care provider tells you not to drink. If you drink alcohol: Limit how much you have to 0-2 drinks a day. Know how much alcohol is in your drink. In the [place] one drink equals one 12 oz bottle of beer (355 mL), one 5 oz glass of wine (148 mL), or one 1 oz glass of hard liquor (44 mL). Lifestyle Do not use any products that contain nicotine or tobacco. These products include cigarettes, chewing tobacco, and vaping devices, such as e-cigarettes. If you need help quitting, ask your health care provider. Do not use street drugs. Do not share needles. Ask your health care provider for help if you need support or information about quitting drugs. General instructions Schedule regular health, dental, and eye exams. Stay current with your vaccines. Tell your health care provider if: You often feel depressed. You have ever been abused or do not feel safe at home. Summary Adopting a healthy lifestyle and getting preventive care are important in promoting health and wellness. Follow your health care provider's instructions about healthy diet, exercising, and getting tested or screened for diseases. Follow your health care provider's instructions on monitoring your cholesterol and blood pressure. This information is not intended to replace advice given to you by your health care provider. Make sure you discuss any questions you have with your health care provider. Document Revised: 07/30/2020 Document Reviewed: 07/30/2020 Elsevier Patient Education  2023 Else[employer].  

## 2022-07-18 ENCOUNTER — Encounter: Payer: Self-pay | Admitting: Internal Medicine

## 2022-07-18 MED ORDER — ROSUVASTATIN CALCIUM 10 MG PO TABS
10.0000 mg | ORAL_TABLET | Freq: Every day | ORAL | 1 refills | Status: AC
Start: 2022-07-18 — End: ?

## 2022-09-19 ENCOUNTER — Other Ambulatory Visit: Payer: Self-pay | Admitting: Internal Medicine

## 2022-09-19 DIAGNOSIS — I10 Essential (primary) hypertension: Secondary | ICD-10-CM

## 2022-09-23 ENCOUNTER — Other Ambulatory Visit: Payer: Self-pay | Admitting: Internal Medicine

## 2022-09-23 DIAGNOSIS — N400 Enlarged prostate without lower urinary tract symptoms: Secondary | ICD-10-CM

## 2022-09-29 ENCOUNTER — Other Ambulatory Visit: Payer: Self-pay | Admitting: Internal Medicine

## 2022-09-29 DIAGNOSIS — I10 Essential (primary) hypertension: Secondary | ICD-10-CM

## 2022-10-31 ENCOUNTER — Other Ambulatory Visit: Payer: Self-pay | Admitting: Internal Medicine

## 2022-10-31 DIAGNOSIS — K219 Gastro-esophageal reflux disease without esophagitis: Secondary | ICD-10-CM

## 2022-12-12 ENCOUNTER — Other Ambulatory Visit: Payer: Self-pay | Admitting: Internal Medicine

## 2022-12-12 DIAGNOSIS — I10 Essential (primary) hypertension: Secondary | ICD-10-CM

## 2022-12-19 ENCOUNTER — Other Ambulatory Visit: Payer: Self-pay | Admitting: Internal Medicine

## 2022-12-19 DIAGNOSIS — N400 Enlarged prostate without lower urinary tract symptoms: Secondary | ICD-10-CM

## 2022-12-19 DIAGNOSIS — E785 Hyperlipidemia, unspecified: Secondary | ICD-10-CM

## 2022-12-25 ENCOUNTER — Other Ambulatory Visit: Payer: Self-pay | Admitting: Internal Medicine

## 2022-12-25 DIAGNOSIS — I10 Essential (primary) hypertension: Secondary | ICD-10-CM

## 2023-01-12 ENCOUNTER — Ambulatory Visit (INDEPENDENT_AMBULATORY_CARE_PROVIDER_SITE_OTHER): Payer: Medicare HMO

## 2023-01-12 DIAGNOSIS — Z23 Encounter for immunization: Secondary | ICD-10-CM | POA: Diagnosis not present

## 2023-01-21 ENCOUNTER — Encounter: Payer: Self-pay | Admitting: Internal Medicine

## 2023-01-21 ENCOUNTER — Ambulatory Visit: Payer: Medicare HMO | Admitting: Internal Medicine

## 2023-01-21 VITALS — BP 134/74 | HR 63 | Temp 97.4°F | Ht 68.0 in | Wt 155.4 lb

## 2023-01-21 DIAGNOSIS — E785 Hyperlipidemia, unspecified: Secondary | ICD-10-CM

## 2023-01-21 DIAGNOSIS — K219 Gastro-esophageal reflux disease without esophagitis: Secondary | ICD-10-CM | POA: Diagnosis not present

## 2023-01-21 DIAGNOSIS — I10 Essential (primary) hypertension: Secondary | ICD-10-CM | POA: Diagnosis not present

## 2023-01-21 DIAGNOSIS — L309 Dermatitis, unspecified: Secondary | ICD-10-CM | POA: Diagnosis not present

## 2023-01-21 LAB — CBC WITH DIFFERENTIAL/PLATELET
Basophils Absolute: 0 10*3/uL (ref 0.0–0.1)
Basophils Relative: 0.3 % (ref 0.0–3.0)
Eosinophils Absolute: 0.1 10*3/uL (ref 0.0–0.7)
Eosinophils Relative: 3 % (ref 0.0–5.0)
HCT: 40.6 % (ref 39.0–52.0)
Hemoglobin: 13.5 g/dL (ref 13.0–17.0)
Lymphocytes Relative: 24.8 % (ref 12.0–46.0)
Lymphs Abs: 1.1 10*3/uL (ref 0.7–4.0)
MCHC: 33.2 g/dL (ref 30.0–36.0)
MCV: 94.4 fL (ref 78.0–100.0)
Monocytes Absolute: 0.4 10*3/uL (ref 0.1–1.0)
Monocytes Relative: 8.3 % (ref 3.0–12.0)
Neutro Abs: 2.9 10*3/uL (ref 1.4–7.7)
Neutrophils Relative %: 63.6 % (ref 43.0–77.0)
Platelets: 256 10*3/uL (ref 150.0–400.0)
RBC: 4.3 Mil/uL (ref 4.22–5.81)
RDW: 13.2 % (ref 11.5–15.5)
WBC: 4.6 10*3/uL (ref 4.0–10.5)

## 2023-01-21 LAB — BASIC METABOLIC PANEL WITH GFR
BUN: 18 mg/dL (ref 6–23)
CO2: 30 meq/L (ref 19–32)
Calcium: 9.9 mg/dL (ref 8.4–10.5)
Chloride: 103 meq/L (ref 96–112)
Creatinine, Ser: 1.08 mg/dL (ref 0.40–1.50)
GFR: 66.43 mL/min
Glucose, Bld: 100 mg/dL — ABNORMAL HIGH (ref 70–99)
Potassium: 4.9 meq/L (ref 3.5–5.1)
Sodium: 140 meq/L (ref 135–145)

## 2023-01-21 LAB — HEPATIC FUNCTION PANEL
ALT: 22 U/L (ref 0–53)
AST: 25 U/L (ref 0–37)
Albumin: 4.6 g/dL (ref 3.5–5.2)
Alkaline Phosphatase: 51 U/L (ref 39–117)
Bilirubin, Direct: 0.2 mg/dL (ref 0.0–0.3)
Total Bilirubin: 0.9 mg/dL (ref 0.2–1.2)
Total Protein: 7.5 g/dL (ref 6.0–8.3)

## 2023-01-21 MED ORDER — CLOTRIMAZOLE-BETAMETHASONE 1-0.05 % EX CREA
TOPICAL_CREAM | Freq: Two times a day (BID) | CUTANEOUS | 3 refills | Status: AC
Start: 2023-01-21 — End: ?

## 2023-01-21 MED ORDER — IRBESARTAN 150 MG PO TABS: 150.0000 mg | ORAL_TABLET | Freq: Every day | ORAL | 1 refills | Status: DC

## 2023-01-21 MED ORDER — METOPROLOL SUCCINATE ER 25 MG PO TB24: 25.0000 mg | ORAL_TABLET | Freq: Every day | ORAL | 1 refills | Status: DC

## 2023-01-21 NOTE — Patient Instructions (Signed)
Hypertension, Adult High blood pressure (hypertension) is when the force of blood pumping through the arteries is too strong. The arteries are the blood vessels that carry blood from the heart throughout the body. Hypertension forces the heart to work harder to pump blood and may cause arteries to become narrow or stiff. Untreated or uncontrolled hypertension can lead to a heart attack, heart failure, a stroke, kidney disease, and other problems. A blood pressure reading consists of a higher number over a lower number. Ideally, your blood pressure should be below 120/80. The first ("top") number is called the systolic pressure. It is a measure of the pressure in your arteries as your heart beats. The second ("bottom") number is called the diastolic pressure. It is a measure of the pressure in your arteries as the heart relaxes. What are the causes? The exact cause of this condition is not known. There are some conditions that result in high blood pressure. What increases the risk? Certain factors may make you more likely to develop high blood pressure. Some of these risk factors are under your control, including: Smoking. Not getting enough exercise or physical activity. Being overweight. Having too much fat, sugar, calories, or salt (sodium) in your diet. Drinking too much alcohol. Other risk factors include: Having a personal history of heart disease, diabetes, high cholesterol, or kidney disease. Stress. Having a family history of high blood pressure and high cholesterol. Having obstructive sleep apnea. Age. The risk increases with age. What are the signs or symptoms? High blood pressure may not cause symptoms. Very high blood pressure (hypertensive crisis) may cause: Headache. Fast or irregular heartbeats (palpitations). Shortness of breath. Nosebleed. Nausea and vomiting. Vision changes. Severe chest pain, dizziness, and seizures. How is this diagnosed? This condition is diagnosed by  measuring your blood pressure while you are seated, with your arm resting on a flat surface, your legs uncrossed, and your feet flat on the floor. The cuff of the blood pressure monitor will be placed directly against the skin of your upper arm at the level of your heart. Blood pressure should be measured at least twice using the same arm. Certain conditions can cause a difference in blood pressure between your right and left arms. If you have a high blood pressure reading during one visit or you have normal blood pressure with other risk factors, you may be asked to: Return on a different day to have your blood pressure checked again. Monitor your blood pressure at home for 1 week or longer. If you are diagnosed with hypertension, you may have other blood or imaging tests to help your health care provider understand your overall risk for other conditions. How is this treated? This condition is treated by making healthy lifestyle changes, such as eating healthy foods, exercising more, and reducing your alcohol intake. You may be referred for counseling on a healthy diet and physical activity. Your health care provider may prescribe medicine if lifestyle changes are not enough to get your blood pressure under control and if: Your systolic blood pressure is above 130. Your diastolic blood pressure is above 80. Your personal target blood pressure may vary depending on your medical conditions, your age, and other factors. Follow these instructions at home: Eating and drinking  Eat a diet that is high in fiber and potassium, and low in sodium, added sugar, and fat. An example of this eating plan is called the DASH diet. DASH stands for Dietary Approaches to Stop Hypertension. To eat this way: Eat   plenty of fresh fruits and vegetables. Try to fill one half of your plate at each meal with fruits and vegetables. Eat whole grains, such as whole-wheat pasta, brown rice, or whole-grain bread. Fill about one  fourth of your plate with whole grains. Eat or drink low-fat dairy products, such as skim milk or low-fat yogurt. Avoid fatty cuts of meat, processed or cured meats, and poultry with skin. Fill about one fourth of your plate with lean proteins, such as fish, chicken without skin, beans, eggs, or tofu. Avoid pre-made and processed foods. These tend to be higher in sodium, added sugar, and fat. Reduce your daily sodium intake. Many people with hypertension should eat less than 1,500 mg of sodium a day. Do not drink alcohol if: Your health care provider tells you not to drink. You are pregnant, may be pregnant, or are planning to become pregnant. If you drink alcohol: Limit how much you have to: 0-1 drink a day for women. 0-2 drinks a day for men. Know how much alcohol is in your drink. In the [place] one drink equals one 12 oz bottle of beer (355 mL), one 5 oz glass of wine (148 mL), or one 1 oz glass of hard liquor (44 mL). Lifestyle  Work with your health care provider to maintain a healthy body weight or to lose weight. Ask what an ideal weight is for you. Get at least 30 minutes of exercise that causes your heart to beat faster (aerobic exercise) most days of the week. Activities may include walking, swimming, or biking. Include exercise to strengthen your muscles (resistance exercise), such as Pilates or lifting weights, as part of your weekly exercise routine. Try to do these types of exercises for 30 minutes at least 3 days a week. Do not use any products that contain nicotine or tobacco. These products include cigarettes, chewing tobacco, and vaping devices, such as e-cigarettes. If you need help quitting, ask your health care provider. Monitor your blood pressure at home as told by your health care provider. Keep all follow-up visits. This is important. Medicines Take over-the-counter and prescription medicines only as told by your health care provider. Follow directions carefully. Blood  pressure medicines must be taken as prescribed. Do not skip doses of blood pressure medicine. Doing this puts you at risk for problems and can make the medicine less effective. Ask your health care provider about side effects or reactions to medicines that you should watch for. Contact a health care provider if you: Think you are having a reaction to a medicine you are taking. Have headaches that keep coming back (recurring). Feel dizzy. Have swelling in your ankles. Have trouble with your vision. Get help right away if you: Develop a severe headache or confusion. Have unusual weakness or numbness. Feel faint. Have severe pain in your chest or abdomen. Vomit repeatedly. Have trouble breathing. These symptoms may be an emergency. Get help right away. Call 911. Do not wait to see if the symptoms will go away. Do not drive yourself to the hospital. Summary Hypertension is when the force of blood pumping through your arteries is too strong. If this condition is not controlled, it may put you at risk for serious complications. Your personal target blood pressure may vary depending on your medical conditions, your age, and other factors. For most people, a normal blood pressure is less than 120/80. Hypertension is treated with lifestyle changes, medicines, or a combination of both. Lifestyle changes include losing weight, eating a healthy,   low-sodium diet, exercising more, and limiting alcohol. This information is not intended to replace advice given to you by your health care provider. Make sure you discuss any questions you have with your health care provider. Document Revised: 01/15/2021 Document Reviewed: 01/15/2021 Elsevier Patient Education  2024 [employer] Inc.  

## 2023-01-21 NOTE — Progress Notes (Unsigned)
Subjective:  Patient ID: Anthony Collins, male    DOB: Oct 17, 1945  Age: 77 y.o. MRN: [id number]  CC: Hypertension and Gastroesophageal Reflux   HPI Anthony Collins presents for f/up ----  Discussed the use of AI scribe software for clinical note transcription with the patient, who gave verbal consent to proceed.  History of Present Illness   The patient, a 77 year old with a history of hyperlipidemia and gastroesophageal reflux disease (GERD), presents for a routine follow-up. He maintains an active lifestyle, walking two miles three to four days a week, and performing resistance training with light dumbbells. He lives on the third floor and regularly takes the stairs. He denies experiencing any palpitations, chest pain, shortness of breath, dizziness, or lightheadedness during these activities.  The patient has been taking omeprazole regularly for GERD. He attempted to discontinue the medication a few weeks ago but experienced a return of indigestion symptoms. He denies any dysphagia, changes in appetite, weight loss, abdominal pain, or hematochezia.  Recently, the patient started taking statins, as recommended in a previous consultation. He reports no side effects, specifically denying any muscle or joint aches. He also uses Lotrisone cream occasionally for itching on his legs and around the rectum. He has found that using powder around the rectum helps keep the area dry and reduces itching. He has not needed a refill of the Lotrisone cream in a while.       Outpatient Medications Prior to Visit  Medication Sig Dispense Refill   Bacillus Coagulans-Inulin (PROBIOTIC FORMULA PO) Take by mouth.     Cholecalciferol (VITAMIN D3) 1000 UNITS CAPS Take 1 capsule by mouth daily.     cyanocobalamin 1000 MCG tablet Take 1,000 mcg by mouth daily.     finasteride (PROSCAR) 5 MG tablet TAKE 1 TABLET (5 MG TOTAL) BY MOUTH DAILY. 90 tablet 0   Lutein-Zeaxanthin 20-1 MG CAPS Take 1 capsule by mouth  daily.     omeprazole (PRILOSEC) 40 MG capsule TAKE 1 CAPSULE (40 MG TOTAL) BY MOUTH DAILY AS NEEDED (HEART BURN). 90 capsule 0   rosuvastatin (CRESTOR) 10 MG tablet TAKE 1 TABLET BY MOUTH EVERY DAY 90 tablet 0   tamsulosin (FLOMAX) 0.4 MG CAPS capsule TAKE 1 CAPSULE BY MOUTH EVERY DAY 90 capsule 0   clotrimazole-betamethasone (LOTRISONE) cream APPLY TO AFFECTED AREA TWICE A DAY 45 g 3   irbesartan (AVAPRO) 150 MG tablet TAKE 1 TABLET BY MOUTH EVERY DAY 90 tablet 0   metoprolol succinate (TOPROL-XL) 25 MG 24 hr tablet TAKE 1 TABLET BY MOUTH EVERY DAY IN THE EVENING 90 tablet 0   No facility-administered medications prior to visit.    ROS Review of Systems  Constitutional:  Negative for diaphoresis, fatigue and fever.  HENT: Negative.    Eyes: Negative.   Respiratory: Negative.  Negative for chest tightness, shortness of breath and wheezing.   Cardiovascular:  Negative for chest pain, palpitations and leg swelling.  Gastrointestinal:  Negative for abdominal pain, constipation, diarrhea, nausea and vomiting.  Endocrine: Negative.   Genitourinary: Negative.  Negative for difficulty urinating.  Musculoskeletal: Negative.  Negative for arthralgias and myalgias.  Skin: Negative.   Neurological: Negative.  Negative for dizziness and weakness.  Hematological:  Negative for adenopathy. Does not bruise/bleed easily.  Psychiatric/Behavioral: Negative.      Objective:  BP 134/74 (BP Location: Left Arm, Patient Position: Sitting, Cuff Size: Normal)   Pulse 63   Temp (!) 97.4 F (36.3 C) (Oral)   Ht 5\' 8"  (  1.727 m)   Wt 155 lb 6.4 oz (70.5 kg)   SpO2 97%   BMI 23.63 kg/m   BP Readings from Last 3 Encounters:  01/21/23 134/74  07/17/22 (!) 142/78  03/20/22 (!) 142/76    Wt Readings from Last 3 Encounters:  01/21/23 155 lb 6.4 oz (70.5 kg)  07/17/22 158 lb (71.7 kg)  06/26/22 159 lb 8 oz (72.3 kg)    Physical Exam Vitals reviewed.  Constitutional:      Appearance: Normal  appearance.  HENT:     Mouth/Throat:     Mouth: Mucous membranes are moist.  Eyes:     General: No scleral icterus.    Conjunctiva/sclera: Conjunctivae normal.  Cardiovascular:     Rate and Rhythm: Normal rate and regular rhythm.     Heart sounds: Normal heart sounds, S1 normal and S2 normal. No murmur heard.    No gallop.     Comments: EKG- NSR, 60 bpm No LVH, Q waves, or ST/T waves  Pulmonary:     Effort: Pulmonary effort is normal.     Breath sounds: No stridor. No wheezing, rhonchi or rales.  Abdominal:     General: Abdomen is flat.     Palpations: There is no mass.     Tenderness: There is no abdominal tenderness. There is no guarding.     Hernia: No hernia is present.  Musculoskeletal:     Cervical back: Neck supple.     Right lower leg: No edema.     Left lower leg: No edema.  Skin:    General: Skin is warm and dry.  Neurological:     General: No focal deficit present.     Mental Status: He is alert. Mental status is at baseline.  Psychiatric:        Mood and Affect: Mood normal.        Behavior: Behavior normal.     Lab Results  Component Value Date   WBC 4.6 01/21/2023   HGB 13.5 01/21/2023   HCT 40.6 01/21/2023   PLT 256.0 01/21/2023   GLUCOSE 100 (H) 01/21/2023   CHOL 168 07/17/2022   TRIG 90.0 07/17/2022   HDL 59.50 07/17/2022   LDLCALC 90 07/17/2022   ALT 22 01/21/2023   AST 25 01/21/2023   NA 140 01/21/2023   K 4.9 01/21/2023   CL 103 01/21/2023   CREATININE 1.08 01/21/2023   BUN 18 01/21/2023   CO2 30 01/21/2023   TSH 3.04 07/17/2022   PSA 0.64 07/17/2022   HGBA1C 5.2 01/13/2017    CT CARDIAC SCORING (SELF PAY ONLY)  Addendum Date: 07/16/2020   ADDENDUM REPORT: 07/16/2020 12:37 CLINICAL DATA:  Cardiovascular Disease Risk stratification EXAM: Coronary Calcium Score TECHNIQUE: A gated, non-contrast computed tomography scan of the heart was performed using 3mm slice thickness. Axial images were analyzed on a dedicated workstation. Calcium  scoring of the coronary arteries was performed using the Agatston method. FINDINGS: Coronary Calcium Score: Left main: 0 Left anterior descending artery: 0 Left circumflex artery: 0 Right coronary artery: 0 Total: 0 Percentile: NA Pericardium: Normal. Ascending Aorta: Normal caliber. Non-cardiac: See separate report from [facility]. IMPRESSION: Coronary calcium score of 0. RECOMMENDATIONS: Coronary artery calcium (CAC) score is a strong predictor of incident coronary heart disease (CHD) and provides predictive information beyond traditional risk factors. CAC scoring is reasonable to use in the decision to withhold, postpone, or initiate statin therapy in intermediate-risk or selected borderline-risk asymptomatic adults (age 77-75 years and LDL-C >=70  to <190 mg/dL) who do not have diabetes or established atherosclerotic cardiovascular disease (ASCVD).* In intermediate-risk (10-year ASCVD risk >=7.5% to <20%) adults or selected borderline-risk (10-year ASCVD risk >=5% to <7.5%) adults in whom a CAC score is measured for the purpose of making a treatment decision the following recommendations have been made: If CAC=0, it is reasonable to withhold statin therapy and reassess in 5 to 10 years, as long as higher risk conditions are absent (diabetes mellitus, family history of premature CHD in first degree relatives (males <55 years; females <65 years), cigarette smoking, or LDL >=190 mg/dL). If CAC is 1 to 99, it is reasonable to initiate statin therapy for patients >=58 years of age. If CAC is >=100 or >=75th percentile, it is reasonable to initiate statin therapy at any age. Cardiology referral should be considered for patients with CAC scores >=400 or >=75th percentile. *2018 AHA/ACC/AACVPR/AAPA/ABC/ACPM/ADA/AGS/APhA/ASPC/NLA/PCNA Guideline on the Management of Blood Cholesterol: A Report of the American College of Cardiology/American Heart Association Task Force on Clinical Practice Guidelines. J Am Coll  Cardiol. 2019;73(24):3168-3209. Lennie Odor, MD Electronically Signed   By: Lennie Odor   On: 07/16/2020 12:37   Result Date: 07/16/2020 EXAM: OVER-READ INTERPRETATION  CT CHEST The following report is an over-read performed by radiologist Dr. Trudie Reed of [facility] on 07/16/2020. This over-read does not include interpretation of cardiac or coronary anatomy or pathology. The coronary calcium score interpretation by the cardiologist is attached. COMPARISON:  None. FINDINGS: 3 mm right lower lobe pulmonary nodule (axial image 7 of series 3). Within the visualized portions of the thorax there are no other larger more suspicious appearing pulmonary nodules or masses, there is no acute consolidative airspace disease, no pleural effusions, no pneumothorax and no lymphadenopathy. Visualized portions of the upper abdomen are unremarkable. There are no aggressive appearing lytic or blastic lesions noted in the visualized portions of the skeleton. IMPRESSION: 1. 3 mm right lower lobe pulmonary nodule, nonspecific, but statistically likely benign. No follow-up needed if patient is low-risk. Non-contrast chest CT can be considered in 12 months if patient is high-risk. This recommendation follows the consensus statement: Guidelines for Management of Incidental Pulmonary Nodules Detected on CT Images: From the Fleischner Society 2017; Radiology 2017; 284:228-243. Electronically Signed: By: Trudie Reed M.D. On: 07/16/2020 10:08    Assessment & Plan:   Essential hypertension- EKG is negative for LVH. BP is well controlled. -     CBC with Differential/Platelet; Future -     Basic metabolic panel; Future -     EKG 12-Lead -     Hepatic function panel; Future -     Metoprolol Succinate ER; Take 1 tablet (25 mg total) by mouth daily.  Dispense: 90 tablet; Refill: 1 -     Irbesartan; Take 1 tablet (150 mg total) by mouth daily.  Dispense: 90 tablet; Refill: 1  Gastroesophageal reflux disease  without esophagitis -     CBC with Differential/Platelet; Future  Hyperlipidemia with target LDL less than 100 - LDL goal achieved. Doing well on the statin  -     Hepatic function panel; Future  Perianal dermatitis -     Clotrimazole-Betamethasone; Apply topically 2 (two) times daily.  Dispense: 45 g; Refill: 3     Follow-up: Return in about 6 months (around 07/22/2023).  Sanda Linger, MD

## 2023-01-27 DIAGNOSIS — Z961 Presence of intraocular lens: Secondary | ICD-10-CM | POA: Diagnosis not present

## 2023-01-27 DIAGNOSIS — H5213 Myopia, bilateral: Secondary | ICD-10-CM | POA: Diagnosis not present

## 2023-01-29 ENCOUNTER — Other Ambulatory Visit: Payer: Self-pay | Admitting: Internal Medicine

## 2023-01-29 DIAGNOSIS — K219 Gastro-esophageal reflux disease without esophagitis: Secondary | ICD-10-CM

## 2023-02-25 DIAGNOSIS — L821 Other seborrheic keratosis: Secondary | ICD-10-CM | POA: Diagnosis not present

## 2023-02-25 DIAGNOSIS — D2372 Other benign neoplasm of skin of left lower limb, including hip: Secondary | ICD-10-CM | POA: Diagnosis not present

## 2023-02-25 DIAGNOSIS — D225 Melanocytic nevi of trunk: Secondary | ICD-10-CM | POA: Diagnosis not present

## 2023-02-25 DIAGNOSIS — L738 Other specified follicular disorders: Secondary | ICD-10-CM | POA: Diagnosis not present

## 2023-02-25 DIAGNOSIS — L812 Freckles: Secondary | ICD-10-CM | POA: Diagnosis not present

## 2023-02-25 DIAGNOSIS — L080 Pyoderma: Secondary | ICD-10-CM | POA: Diagnosis not present

## 2023-02-25 DIAGNOSIS — D1801 Hemangioma of skin and subcutaneous tissue: Secondary | ICD-10-CM | POA: Diagnosis not present

## 2023-02-25 DIAGNOSIS — Z85828 Personal history of other malignant neoplasm of skin: Secondary | ICD-10-CM | POA: Diagnosis not present

## 2023-02-25 DIAGNOSIS — D485 Neoplasm of uncertain behavior of skin: Secondary | ICD-10-CM | POA: Diagnosis not present

## 2023-03-09 DIAGNOSIS — R69 Illness, unspecified: Secondary | ICD-10-CM | POA: Diagnosis not present

## 2023-03-13 ENCOUNTER — Other Ambulatory Visit: Payer: Self-pay | Admitting: Internal Medicine

## 2023-03-13 DIAGNOSIS — E785 Hyperlipidemia, unspecified: Secondary | ICD-10-CM

## 2023-03-18 ENCOUNTER — Other Ambulatory Visit: Payer: Self-pay | Admitting: Internal Medicine

## 2023-03-18 DIAGNOSIS — N400 Enlarged prostate without lower urinary tract symptoms: Secondary | ICD-10-CM

## 2023-04-26 ENCOUNTER — Other Ambulatory Visit: Payer: Self-pay | Admitting: Internal Medicine

## 2023-04-26 DIAGNOSIS — K219 Gastro-esophageal reflux disease without esophagitis: Secondary | ICD-10-CM

## 2023-05-05 ENCOUNTER — Other Ambulatory Visit: Payer: Self-pay | Admitting: Internal Medicine

## 2023-05-05 DIAGNOSIS — K219 Gastro-esophageal reflux disease without esophagitis: Secondary | ICD-10-CM

## 2023-05-27 ENCOUNTER — Other Ambulatory Visit: Payer: Self-pay | Admitting: Internal Medicine

## 2023-05-27 DIAGNOSIS — E785 Hyperlipidemia, unspecified: Secondary | ICD-10-CM

## 2023-05-27 MED ORDER — ROSUVASTATIN CALCIUM 10 MG PO TABS: 10.0000 mg | ORAL_TABLET | Freq: Every day | ORAL | 0 refills | Status: DC

## 2023-06-21 ENCOUNTER — Other Ambulatory Visit: Payer: Self-pay | Admitting: Internal Medicine

## 2023-06-21 DIAGNOSIS — N400 Enlarged prostate without lower urinary tract symptoms: Secondary | ICD-10-CM

## 2023-06-29 ENCOUNTER — Telehealth: Payer: Self-pay

## 2023-06-29 ENCOUNTER — Ambulatory Visit (INDEPENDENT_AMBULATORY_CARE_PROVIDER_SITE_OTHER)

## 2023-06-29 ENCOUNTER — Other Ambulatory Visit: Payer: Self-pay | Admitting: Internal Medicine

## 2023-06-29 VITALS — Ht 68.0 in | Wt 157.0 lb

## 2023-06-29 DIAGNOSIS — E785 Hyperlipidemia, unspecified: Secondary | ICD-10-CM

## 2023-06-29 DIAGNOSIS — K219 Gastro-esophageal reflux disease without esophagitis: Secondary | ICD-10-CM

## 2023-06-29 DIAGNOSIS — Z0000 Encounter for general adult medical examination without abnormal findings: Secondary | ICD-10-CM | POA: Diagnosis not present

## 2023-06-29 DIAGNOSIS — N400 Enlarged prostate without lower urinary tract symptoms: Secondary | ICD-10-CM

## 2023-06-29 DIAGNOSIS — I10 Essential (primary) hypertension: Secondary | ICD-10-CM

## 2023-06-29 MED ORDER — METOPROLOL SUCCINATE ER 25 MG PO TB24: 25.0000 mg | ORAL_TABLET | Freq: Every day | ORAL | 0 refills | Status: DC

## 2023-06-29 MED ORDER — OMEPRAZOLE 40 MG PO CPDR: 40.0000 mg | DELAYED_RELEASE_CAPSULE | Freq: Every day | ORAL | 0 refills | Status: DC

## 2023-06-29 MED ORDER — ROSUVASTATIN CALCIUM 10 MG PO TABS: 10.0000 mg | ORAL_TABLET | Freq: Every day | ORAL | 0 refills | Status: DC

## 2023-06-29 MED ORDER — IRBESARTAN 150 MG PO TABS: 150.0000 mg | ORAL_TABLET | Freq: Every day | ORAL | 0 refills | Status: DC

## 2023-06-29 MED ORDER — TAMSULOSIN HCL 0.4 MG PO CAPS: 0.4000 mg | ORAL_CAPSULE | Freq: Every day | ORAL | 0 refills | Status: DC

## 2023-06-29 NOTE — Telephone Encounter (Signed)
 Patient informed me that he is now with pharmacy [facility] on [address].  He stated that his refills were still going to [facility].

## 2023-06-29 NOTE — Patient Instructions (Signed)
 Mr. Anthony Collins , Thank you for taking time to come for your Medicare Wellness Visit. I appreciate your ongoing commitment to your health goals. Please review the following plan we discussed and let me know if I can assist you in the future.   Referrals/Orders/Follow-Ups/Clinician Recommendations: It was nice to speak with you today.  You are due for a Tetanus vaccine.  Keep up the good work.    This is a list of the screening recommended for you and due dates:  Health Maintenance  Topic Date Due   DTaP/Tdap/Td vaccine (3 - Td or Tdap) 10/27/2022   Flu Shot  10/23/2023   Medicare Annual Wellness Visit  06/28/2024   Pneumonia Vaccine  Completed   Hepatitis C Screening  Completed   Zoster (Shingles) Vaccine  Completed   HPV Vaccine  Aged Out   Colon Cancer Screening  Discontinued   COVID-19 Vaccine  Discontinued    Advanced directives: (Copy Requested) Please bring a copy of your health care power of attorney and living will to the office to be added to your chart at your convenience. You can mail to [facility] [address] [place], [place] [address] or email to [email or web address]  Next Medicare Annual Wellness Visit scheduled for next year: Yes

## 2023-06-29 NOTE — Progress Notes (Signed)
 Subjective:   Anthony Collins is a 78 y.o. who presents for a Medicare Wellness preventive visit.  Visit Complete: Virtual I connected with  Anthony Collins on 06/29/23 by a video and audio enabled telemedicine application and verified that I am speaking with the correct person using two identifiers.  Patient Location: Home  Provider Location: Home Office  I discussed the limitations of evaluation and management by telemedicine. The patient expressed understanding and agreed to proceed.  Vital Signs: Because this visit was a virtual/telehealth visit, some criteria may be missing or patient reported. Any vitals not documented were not able to be obtained and vitals that have been documented are patient reported.   Persons Participating in Visit: Patient.  AWV Questionnaire: Yes: Patient Medicare AWV questionnaire was completed by the patient on 06/29/2023; I have confirmed that all information answered by patient is correct and no changes since this date.  Cardiac Risk Factors include: advanced age (>20men, >6 women);hypertension;male gender;dyslipidemia     Objective:    Today's Vitals   06/29/23 0921  Weight: 157 lb (71.2 kg)  Height: 5\' 8"  (1.727 m)   Body mass index is 23.87 kg/m.     06/29/2023    9:37 AM 06/26/2022   10:50 AM 06/24/2021    1:31 PM 06/13/2020    9:08 AM 06/06/2020    4:13 PM 05/23/2019    8:26 AM 11/26/2017   10:11 AM  Advanced Directives  Does Patient Have a Medical Advance Directive? Yes Yes Yes Yes Yes Yes Yes  Type of Estate agent of [place];Living will Healthcare Power of [place];Living will Healthcare Power of [place];Living will Healthcare Power of [place];Living will Healthcare Power of [place];Living will Healthcare Power of [place];Living will Healthcare Power of [place];Living will  Does patient want to make changes to medical advance directive?    No - Patient declined No - Patient declined Yes (ED - Information  included in AVS)   Copy of Healthcare Power of Attorney in Chart? No - copy requested No - copy requested No - copy requested Yes - validated most recent copy scanned in chart (See row information)  No - copy requested No - copy requested    Current Medications (verified) Outpatient Encounter Medications as of 06/29/2023  Medication Sig   Bacillus Coagulans-Inulin (PROBIOTIC FORMULA PO) Take by mouth.   Cholecalciferol (VITAMIN D3) 1000 UNITS CAPS Take 1 capsule by mouth daily.   clotrimazole-betamethasone (LOTRISONE) cream Apply topically 2 (two) times daily.   cyanocobalamin 1000 MCG tablet Take 1,000 mcg by mouth daily.   finasteride (PROSCAR) 5 MG tablet TAKE 1 TABLET (5 MG TOTAL) BY MOUTH DAILY.   irbesartan (AVAPRO) 150 MG tablet Take 1 tablet (150 mg total) by mouth daily.   Lutein-Zeaxanthin 20-1 MG CAPS Take 1 capsule by mouth daily.   metoprolol succinate (TOPROL-XL) 25 MG 24 hr tablet Take 1 tablet (25 mg total) by mouth daily.   omeprazole (PRILOSEC) 40 MG capsule TAKE 1 CAPSULES BY MOUTH DAILY AS NEEDED FOR HEARTBURN   rosuvastatin (CRESTOR) 10 MG tablet Take 1 tablet (10 mg total) by mouth daily.   tamsulosin (FLOMAX) 0.4 MG CAPS capsule TAKE 1 CAPSULE BY MOUTH EVERY DAY   No facility-administered encounter medications on file as of 06/29/2023.    Allergies (verified) Patient has no known allergies.   History: Past Medical History:  Diagnosis Date   Bladder calculus    GERD (gastroesophageal reflux disease)    History of kidney stones  Hypertension    Spinal stenosis    Urgency of urination    Wears glasses    Past Surgical History:  Procedure Laterality Date   BLEPHAROPLASTY     EXTRACORPOREAL SHOCK WAVE LITHOTRIPSY  2004   EYE SURGERY     INGUINAL HERNIA REPAIR Right 05/04/2017   Procedure: OPEN RIGHT INGUINAL HERNIA REPAIR WITH MESH;  Surgeon: Jimmye Norman, MD;  Location: [facility];  Service: General;  Laterality: Right;   INGUINAL HERNIA  REPAIR Left 1998  (spinal anesthesia)   INGUINAL HERNIA REPAIR Bilateral 06/13/2020   Procedure: LAPAROSCOPIC RIGHT INGUINAL HERNIA REPAIR WITH MESH;  Surgeon: Harriette Bouillon, MD;  Location: [facility];  Service: General;  Laterality: Bilateral;  ROOM 8 STARTING AT 11:00AM FOR 120 MIN   INSERTION OF MESH Right 05/04/2017   Procedure: INSERTION OF MESH;  Surgeon: Jimmye Norman, MD;  Location: [facility];  Service: General;  Laterality: Right;   TONSILLECTOMY  age 89   Family History  Problem Relation Age of Onset   Breast cancer Mother    Hypertension Mother    Lung cancer Father    Colon cancer Neg Hx    Esophageal cancer Neg Hx    Pancreatic cancer Neg Hx    Stomach cancer Neg Hx    Social History   Socioeconomic History   Marital status: Married    Spouse name: Johnnie   Number of children: 2   Years of education: 16   Highest education level: Bachelor's degree (e.g., BA, AB, BS)  Occupational History   Occupation: Retired   Tobacco Use   Smoking status: Never   Smokeless tobacco: Never  Vaping Use   Vaping status: Never Used  Substance and Sexual Activity   Alcohol use: Yes    Alcohol/week: 14.0 standard drinks of alcohol    Types: 14 Glasses of wine per week    Comment: 2 wine daily   Drug use: No   Sexual activity: Yes    Partners: Female  Other Topics Concern   Not on file  Social History Narrative   Fun/Hobby: Exercise, Electronics engineer, 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     Social Drivers of Corporate investment banker Strain: Low Risk  (06/29/2023)   Overall Financial Resource Strain (CARDIA)    Difficulty of Paying Living Expenses: Not hard at all  Food Insecurity: No Food Insecurity (06/29/2023)   Hunger Vital Sign    Worried About Running Out of Food in the Last Year: Never true    Ran Out of Food in the Last Year: Never true  Transportation Needs: No Transportation Needs (06/29/2023)   PRAPARE - Therapist, art (Medical): No    Lack of Transportation (Non-Medical): No  Physical Activity: Insufficiently Active (06/29/2023)   Exercise Vital Sign    Days of Exercise per Week: 3 days    Minutes of Exercise per Session: 40 min  Stress: No Stress Concern Present (06/29/2023)   [employer] of Occupational Health - Occupational Stress Questionnaire    Feeling of Stress : Only a little  Social Connections: Socially Integrated (06/29/2023)   Social Connection and Isolation Panel [NHANES]    Frequency of Communication with Friends and Family: More than three times a week    Frequency of Social Gatherings with Friends and Family: Three times a week    Attends Religious Services: More than 4 times per year  Active Member of Clubs or Organizations: Yes    Attends Banker Meetings: More than 4 times per year    Marital Status: Married    Tobacco Counseling Counseling given: Not Answered    Clinical Intake:  Pre-visit preparation completed: Yes  Pain : No/denies pain     BMI - recorded: 23.87 Nutritional Status: BMI of 19-24  Normal Nutritional Risks: None Diabetes: No  Lab Results  Component Value Date   HGBA1C 5.2 01/13/2017     How often do you need to have someone help you when you read instructions, pamphlets, or other written materials from your doctor or pharmacy?: 1 - Never  Interpreter Needed?: No  Information entered by :: Sherma Vanmetre, RMA   Activities of Daily Living     06/29/2023    9:22 AM  In your present state of health, do you have any difficulty performing the following activities:  Hearing? 0  Vision? 0  Difficulty concentrating or making decisions? 0  Walking or climbing stairs? 0  Dressing or bathing? 0  Doing errands, shopping? 0  Preparing Food and eating ? N  Using the Toilet? N  In the past six months, have you accidently leaked urine? N  Do you have problems with loss of bowel control? N  Managing your  Medications? N  Managing your Finances? N  Housekeeping or managing your Housekeeping? N    Patient Care Team: Etta Grandchild, MD as PCP - General (Internal Medicine) Hildred Laser, MD (Inactive) as Consulting Physician (Urology) Donzetta Starch, MD as Consulting Physician (Dermatology) Maris Berger, MD as Consulting Physician (Ophthalmology) Juluis Pitch, DDS (Dentistry) Marchia Bond, DMD (Dentistry) Eldred Manges, MD (Inactive) as Consulting Physician (Orthopedic Surgery) Dahlia Byes, [facility] as Pharmacist (Pharmacist)  Indicate any recent Medical Services you may have received from other than [facility] providers in the past year (date may be approximate).     Assessment:   This is a routine wellness examination for Anthony Collins.  Hearing/Vision screen Hearing Screening - Comments:: Denies hearing difficulties   Vision Screening - Comments:: Cataract surgery   Goals Addressed             This Visit's Progress    My goal for 2024 is to maintain my health, stay independent and active.  I feel very blessed.   On track      Depression Screen     06/29/2023    9:40 AM 01/21/2023    8:15 AM 06/26/2022   10:52 AM 06/24/2021    1:32 PM 06/24/2021    1:29 PM 06/12/2021    9:50 AM 06/11/2020    8:26 AM  PHQ 2/9 Scores  PHQ - 2 Score 0 0 0 0 0 0 0  PHQ- 9 Score 0  0        Fall Risk     06/29/2023    9:37 AM 01/21/2023    8:15 AM 06/26/2022   10:59 AM 06/25/2022    8:57 AM 06/24/2021    1:32 PM  Fall Risk   Falls in the past year? 0 0 0 0 0  Number falls in past yr: 0 0 0  0  Injury with Fall? 0 0 0  0  Risk for fall due to : No Fall Risks No Fall Risks No Fall Risks    Follow up Falls prevention discussed;Falls evaluation completed Falls evaluation completed Falls prevention discussed  Falls evaluation completed    MEDICARE RISK AT HOME:  Medicare  Risk at Home Any stairs in or around the home?: Yes (lives on the 3rd floor) If so, are there any without handrails?: Yes Home  free of loose throw rugs in walkways, pet beds, electrical cords, etc?: Yes Adequate lighting in your home to reduce risk of falls?: Yes Life alert?: No Use of a cane, walker or w/c?: No Grab bars in the bathroom?: Yes Shower chair or bench in shower?: Yes Elevated toilet seat or a handicapped toilet?: Yes  TIMED UP AND GO:  Was the test performed?  No  Cognitive Function: 6CIT completed    05/23/2019    9:21 AM 11/26/2017   10:12 AM  MMSE - Mini Mental State Exam  Orientation to time 5 5  Orientation to Place 5 5  Registration 3 3  Attention/ Calculation 5 3  Recall 3 3  Language- name 2 objects 2 2  Language- repeat 1 1  Language- follow 3 step command 3 3  Language- read & follow direction 1 1  Write a sentence 1 1  Copy design 1 1  Total score 30 28        06/29/2023    9:38 AM 06/26/2022   11:00 AM  6CIT Screen  What Year? 0 points 0 points  What month? 0 points 0 points  What time? 0 points 0 points  Count back from 20 0 points 0 points  Months in reverse 0 points 0 points  Repeat phrase 0 points 0 points  Total Score 0 points 0 points    Immunizations Immunization History  Administered Date(s) Administered   Fluad Quad(high Dose 65+) 01/25/2019, 01/23/2020, 01/11/2021, 01/14/2022   Fluad Trivalent(High Dose 65+) 01/12/2023   Influenza, High Dose Seasonal PF 01/12/2017, 01/25/2018   PFIZER(Purple Top)SARS-COV-2 Vaccination 04/09/2019, 04/30/2019   Pneumococcal Conjugate-13 04/28/2014, 11/26/2017   Pneumococcal Polysaccharide-23 01/16/2012, 06/11/2020   Tdap 06/06/2011, 10/26/2012   Zoster Recombinant(Shingrix) 01/12/2017, 06/10/2017    Screening Tests Health Maintenance  Topic Date Due   DTaP/Tdap/Td (3 - Td or Tdap) 10/27/2022   INFLUENZA VACCINE  10/23/2023   Medicare Annual Wellness (AWV)  06/28/2024   Pneumonia Vaccine 56+ Years old  Completed   Hepatitis C Screening  Completed   Zoster Vaccines- Shingrix  Completed   HPV VACCINES  Aged Out    Colonoscopy  Discontinued   COVID-19 Vaccine  Discontinued    Health Maintenance  Health Maintenance Due  Topic Date Due   DTaP/Tdap/Td (3 - Td or Tdap) 10/27/2022   Health Maintenance Items Addressed: See Nurse Notes  Additional Screening:  Vision Screening: Recommended annual ophthalmology exams for early detection of glaucoma and other disorders of the eye.  Dental Screening: Recommended annual dental exams for proper oral hygiene  Community Resource Referral / Chronic Care Management: CRR required this visit?  No   CCM required this visit?  No     Plan:     I have personally reviewed and noted the following in the patient's chart:   Medical and social history Use of alcohol, tobacco or illicit drugs  Current medications and supplements including opioid prescriptions. Patient is not currently taking opioid prescriptions. Functional ability and status Nutritional status Physical activity Advanced directives List of other physicians Hospitalizations, surgeries, and ER visits in previous 12 months Vitals Screenings to include cognitive, depression, and falls Referrals and appointments  In addition, I have reviewed and discussed with patient certain preventive protocols, quality metrics, and best practice recommendations. A written personalized care plan for preventive services as  well as general preventive health recommendations were provided to patient.     Jayen Bromwell L Chez Bulnes, CMA   06/29/2023   After Visit Summary: (MyChart) Due to this being a telephonic visit, the after visit summary with patients personalized plan was offered to patient via MyChart   Notes: Please refer to Routing Comments.

## 2023-07-15 DIAGNOSIS — K080 Exfoliation of teeth due to systemic causes: Secondary | ICD-10-CM | POA: Diagnosis not present

## 2023-07-22 ENCOUNTER — Encounter: Payer: Self-pay | Admitting: Internal Medicine

## 2023-07-22 ENCOUNTER — Ambulatory Visit: Payer: Medicare HMO | Admitting: Internal Medicine

## 2023-07-22 VITALS — BP 140/72 | HR 72 | Temp 98.0°F | Ht 68.0 in | Wt 159.4 lb

## 2023-07-22 DIAGNOSIS — I10 Essential (primary) hypertension: Secondary | ICD-10-CM

## 2023-07-22 DIAGNOSIS — N400 Enlarged prostate without lower urinary tract symptoms: Secondary | ICD-10-CM

## 2023-07-22 DIAGNOSIS — Z23 Encounter for immunization: Secondary | ICD-10-CM

## 2023-07-22 DIAGNOSIS — E785 Hyperlipidemia, unspecified: Secondary | ICD-10-CM | POA: Diagnosis not present

## 2023-07-22 DIAGNOSIS — Z0001 Encounter for general adult medical examination with abnormal findings: Secondary | ICD-10-CM

## 2023-07-22 DIAGNOSIS — K219 Gastro-esophageal reflux disease without esophagitis: Secondary | ICD-10-CM

## 2023-07-22 DIAGNOSIS — Z0000 Encounter for general adult medical examination without abnormal findings: Secondary | ICD-10-CM | POA: Diagnosis not present

## 2023-07-22 LAB — BASIC METABOLIC PANEL WITH GFR
BUN: 17 mg/dL (ref 6–23)
CO2: 29 meq/L (ref 19–32)
Calcium: 9.5 mg/dL (ref 8.4–10.5)
Chloride: 103 meq/L (ref 96–112)
Creatinine, Ser: 1.1 mg/dL (ref 0.40–1.50)
GFR: 64.76 mL/min (ref >60.00)
Glucose, Bld: 99 mg/dL (ref 70–99)
Potassium: 4.6 meq/L (ref 3.5–5.1)
Sodium: 139 meq/L (ref 135–145)

## 2023-07-22 LAB — LIPID PANEL
Cholesterol: 130 mg/dL (ref 0–200)
HDL: 72.8 mg/dL (ref >39.00)
LDL Cholesterol: 44 mg/dL (ref 0–99)
NonHDL: 56.84
Total CHOL/HDL Ratio: 2
Triglycerides: 62 mg/dL (ref 0.0–149.0)
VLDL: 12.4 mg/dL (ref 0.0–40.0)

## 2023-07-22 LAB — CBC WITH DIFFERENTIAL/PLATELET
Basophils Absolute: 0 10*3/uL (ref 0.0–0.1)
Basophils Relative: 0.6 % (ref 0.0–3.0)
Eosinophils Absolute: 0.2 10*3/uL (ref 0.0–0.7)
Eosinophils Relative: 3.7 % (ref 0.0–5.0)
HCT: 39.9 % (ref 39.0–52.0)
Hemoglobin: 13.6 g/dL (ref 13.0–17.0)
Lymphocytes Relative: 23.3 % (ref 12.0–46.0)
Lymphs Abs: 1.1 10*3/uL (ref 0.7–4.0)
MCHC: 34.2 g/dL (ref 30.0–36.0)
MCV: 93.3 fl (ref 78.0–100.0)
Monocytes Absolute: 0.5 10*3/uL (ref 0.1–1.0)
Monocytes Relative: 9.2 % (ref 3.0–12.0)
Neutro Abs: 3.1 10*3/uL (ref 1.4–7.7)
Neutrophils Relative %: 63.2 % (ref 43.0–77.0)
Platelets: 241 10*3/uL (ref 150.0–400.0)
RBC: 4.28 Mil/uL (ref 4.22–5.81)
RDW: 13.2 % (ref 11.5–15.5)
WBC: 4.9 10*3/uL (ref 4.0–10.5)

## 2023-07-22 LAB — URINALYSIS, ROUTINE W REFLEX MICROSCOPIC
Bilirubin Urine: NEGATIVE
Hgb urine dipstick: NEGATIVE
Ketones, ur: NEGATIVE
Leukocytes,Ua: NEGATIVE
Nitrite: NEGATIVE
Specific Gravity, Urine: 1.01 (ref 1.000–1.030)
Total Protein, Urine: NEGATIVE
Urine Glucose: NEGATIVE
Urobilinogen, UA: 0.2 (ref 0.0–1.0)
pH: 6 (ref 5.0–8.0)

## 2023-07-22 LAB — PSA: PSA: 0.64 ng/mL (ref 0.10–4.00)

## 2023-07-22 LAB — HEPATIC FUNCTION PANEL
ALT: 19 U/L (ref 0–53)
AST: 22 U/L (ref 0–37)
Albumin: 4.5 g/dL (ref 3.5–5.2)
Alkaline Phosphatase: 49 U/L (ref 39–117)
Bilirubin, Direct: 0.2 mg/dL (ref 0.0–0.3)
Total Bilirubin: 0.9 mg/dL (ref 0.2–1.2)
Total Protein: 7.1 g/dL (ref 6.0–8.3)

## 2023-07-22 LAB — TSH: TSH: 2.7 u[IU]/mL (ref 0.35–5.50)

## 2023-07-22 LAB — CK: Total CK: 68 U/L (ref 7–232)

## 2023-07-22 MED ORDER — OMEPRAZOLE 20 MG PO CPDR
20.0000 mg | DELAYED_RELEASE_CAPSULE | Freq: Every day | ORAL | 1 refills | Status: DC
Start: 2023-07-22 — End: 2023-10-28

## 2023-07-22 NOTE — Patient Instructions (Signed)
 Health Maintenance, Male  Adopting a healthy lifestyle and getting preventive care are important in promoting health and wellness. Ask your health care provider about:  The right schedule for you to have regular tests and exams.  Things you can do on your own to prevent diseases and keep yourself healthy.  What should I know about diet, weight, and exercise?  Eat a healthy diet    Eat a diet that includes plenty of vegetables, fruits, low-fat dairy products, and lean protein.  Do not eat a lot of foods that are high in solid fats, added sugars, or sodium.  Maintain a healthy weight  Body mass index (BMI) is a measurement that can be used to identify possible weight problems. It estimates body fat based on height and weight. Your health care provider can help determine your BMI and help you achieve or maintain a healthy weight.  Get regular exercise  Get regular exercise. This is one of the most important things you can do for your health. Most adults should:  Exercise for at least 150 minutes each week. The exercise should increase your heart rate and make you sweat (moderate-intensity exercise).  Do strengthening exercises at least twice a week. This is in addition to the moderate-intensity exercise.  Spend less time sitting. Even light physical activity can be beneficial.  Watch cholesterol and blood lipids  Have your blood tested for lipids and cholesterol at 78 years of age, then have this test every 5 years.  You may need to have your cholesterol levels checked more often if:  Your lipid or cholesterol levels are high.  You are older than 78 years of age.  You are at high risk for heart disease.  What should I know about cancer screening?  Many types of cancers can be detected early and may often be prevented. Depending on your health history and family history, you may need to have cancer screening at various ages. This may include screening for:  Colorectal cancer.  Prostate cancer.  Skin cancer.  Lung  cancer.  What should I know about heart disease, diabetes, and high blood pressure?  Blood pressure and heart disease  High blood pressure causes heart disease and increases the risk of stroke. This is more likely to develop in people who have high blood pressure readings or are overweight.  Talk with your health care provider about your target blood pressure readings.  Have your blood pressure checked:  Every 3-5 years if you are 9-95 years of age.  Every year if you are 85 years old or older.  If you are between the ages of 29 and 29 and are a current or former smoker, ask your health care provider if you should have a one-time screening for abdominal aortic aneurysm (AAA).  Diabetes  Have regular diabetes screenings. This checks your fasting blood sugar level. Have the screening done:  Once every three years after age 23 if you are at a normal weight and have a low risk for diabetes.  More often and at a younger age if you are overweight or have a high risk for diabetes.  What should I know about preventing infection?  Hepatitis B  If you have a higher risk for hepatitis B, you should be screened for this virus. Talk with your health care provider to find out if you are at risk for hepatitis B infection.  Hepatitis C  Blood testing is recommended for:  Everyone born from [birth year] through 1965.  Anyone  with known risk factors for hepatitis C.  Sexually transmitted infections (STIs)  You should be screened each year for STIs, including gonorrhea and chlamydia, if:  You are sexually active and are younger than 78 years of age.  You are older than 78 years of age and your health care provider tells you that you are at risk for this type of infection.  Your sexual activity has changed since you were last screened, and you are at increased risk for chlamydia or gonorrhea. Ask your health care provider if you are at risk.  Ask your health care provider about whether you are at high risk for HIV. Your health care provider  may recommend a prescription medicine to help prevent HIV infection. If you choose to take medicine to prevent HIV, you should first get tested for HIV. You should then be tested every 3 months for as long as you are taking the medicine.  Follow these instructions at home:  Alcohol use  Do not drink alcohol if your health care provider tells you not to drink.  If you drink alcohol:  Limit how much you have to 0-2 drinks a day.  Know how much alcohol is in your drink. In the [place] one drink equals one 12 oz bottle of beer (355 mL), one 5 oz glass of wine (148 mL), or one 1 oz glass of hard liquor (44 mL).  Lifestyle  Do not use any products that contain nicotine or tobacco. These products include cigarettes, chewing tobacco, and vaping devices, such as e-cigarettes. If you need help quitting, ask your health care provider.  Do not use street drugs.  Do not share needles.  Ask your health care provider for help if you need support or information about quitting drugs.  General instructions  Schedule regular health, dental, and eye exams.  Stay current with your vaccines.  Tell your health care provider if:  You often feel depressed.  You have ever been abused or do not feel safe at home.  Summary  Adopting a healthy lifestyle and getting preventive care are important in promoting health and wellness.  Follow your health care provider's instructions about healthy diet, exercising, and getting tested or screened for diseases.  Follow your health care provider's instructions on monitoring your cholesterol and blood pressure.  This information is not intended to replace advice given to you by your health care provider. Make sure you discuss any questions you have with your health care provider.  Document Revised: 07/30/2020 Document Reviewed: 07/30/2020  Elsevier Patient Education  2024 [employer].

## 2023-07-22 NOTE — Progress Notes (Signed)
 Subjective:  Patient ID: Anthony Collins, male    DOB: Apr 03, 1945  Age: 78 y.o. MRN: [id number]  CC: Annual Exam, Hypertension, Gastroesophageal Reflux, and Hyperlipidemia   HPI THELMER KLITZ presents for a CPX and f/up ----  Discussed the use of AI scribe software for clinical note transcription with the patient, who gave verbal consent to proceed.  History of Present Illness   Anthony Collins is a 78 year old male who presents for a routine follow-up and medication review.  He feels good overall with no symptoms of high blood pressure, headache, blurred vision, chest pain, shortness of breath, dizziness, or lightheadedness. He has been engaging in a new exercise routine for the past eight weeks, which includes muscle resistance training at a rehab gym. He notes improved muscle strength and endurance, although recovery takes longer than before. His exercise regimen includes using a treadmill and a bike.  He has been taking 40 mg of omeprazole  for years but recently switched to 20 mg, using his wife's prescription, and reports that it has been working well. No trouble swallowing or painful swallowing.  He mentions starting a statin drug and has not noticed any side effects. He requests routine lab work to check cholesterol, liver, and kidney function.  He urinates twice a night, a pattern he has experienced for years, and denies any issues with urine flow.       Outpatient Medications Prior to Visit  Medication Sig Dispense Refill   Bacillus Coagulans-Inulin (PROBIOTIC FORMULA PO) Take by mouth.     Cholecalciferol (VITAMIN D3) 1000 UNITS CAPS Take 1 capsule by mouth daily.     clotrimazole -betamethasone  (LOTRISONE ) cream Apply topically 2 (two) times daily. 45 g 3   cyanocobalamin  1000 MCG tablet Take 1,000 mcg by mouth daily.     finasteride  (PROSCAR ) 5 MG tablet TAKE 1 TABLET (5 MG TOTAL) BY MOUTH DAILY. 90 tablet 0   Lutein-Zeaxanthin 20-1 MG CAPS Take 1 capsule by mouth  daily.     metoprolol  succinate (TOPROL -XL) 25 MG 24 hr tablet Take 1 tablet (25 mg total) by mouth daily. 90 tablet 0   irbesartan  (AVAPRO ) 150 MG tablet Take 1 tablet (150 mg total) by mouth daily. 90 tablet 0   omeprazole  (PRILOSEC) 40 MG capsule Take 1 capsule (40 mg total) by mouth daily. 90 capsule 0   rosuvastatin  (CRESTOR ) 10 MG tablet Take 1 tablet (10 mg total) by mouth daily. 90 tablet 0   tamsulosin  (FLOMAX ) 0.4 MG CAPS capsule Take 1 capsule (0.4 mg total) by mouth daily. 90 capsule 0   No facility-administered medications prior to visit.    ROS Review of Systems  Constitutional: Negative.  Negative for appetite change, chills, diaphoresis, fatigue and fever.  HENT: Negative.    Eyes: Negative.   Respiratory: Negative.  Negative for chest tightness, shortness of breath and wheezing.   Cardiovascular:  Negative for chest pain, palpitations and leg swelling.  Gastrointestinal:  Negative for abdominal pain, constipation, diarrhea, nausea and vomiting.  Endocrine: Negative.   Genitourinary:  Negative for difficulty urinating and dysuria.  Musculoskeletal: Negative.  Negative for arthralgias and myalgias.  Skin: Negative.  Negative for color change.  Neurological: Negative.  Negative for dizziness, weakness and light-headedness.  Hematological:  Negative for adenopathy. Does not bruise/bleed easily.  Psychiatric/Behavioral: Negative.      Objective:  BP (!) 140/72 (BP Location: Left Arm, Patient Position: Sitting)   Pulse 72   Temp 98 F (36.7 C) (Temporal)  Ht 5\' 8"  (1.727 m)   Wt 159 lb 6.4 oz (72.3 kg)   SpO2 96%   BMI 24.24 kg/m   BP Readings from Last 3 Encounters:  07/22/23 (!) 140/72  01/21/23 134/74  07/17/22 (!) 142/78    Wt Readings from Last 3 Encounters:  07/22/23 159 lb 6.4 oz (72.3 kg)  06/29/23 157 lb (71.2 kg)  01/21/23 155 lb 6.4 oz (70.5 kg)    Physical Exam Vitals reviewed.  Constitutional:      Appearance: Normal appearance.  HENT:      Nose: Nose normal.     Mouth/Throat:     Mouth: Mucous membranes are moist.  Eyes:     General: No scleral icterus.    Conjunctiva/sclera: Conjunctivae normal.  Cardiovascular:     Rate and Rhythm: Bradycardia present.     Heart sounds: No murmur heard.    No friction rub. No gallop.     Comments: EKG--- SB, 58 bpm No LVH, Q waves, or ST/T wave changes  Unchanged  Pulmonary:     Effort: Pulmonary effort is normal.     Breath sounds: No stridor. No wheezing, rhonchi or rales.  Abdominal:     General: Abdomen is flat.     Palpations: There is no mass.     Tenderness: There is no abdominal tenderness. There is no guarding.     Hernia: No hernia is present.  Musculoskeletal:        General: No swelling.     Cervical back: Neck supple.     Right lower leg: No edema.     Left lower leg: No edema.  Lymphadenopathy:     Cervical: No cervical adenopathy.  Skin:    General: Skin is warm and dry.  Neurological:     General: No focal deficit present.     Mental Status: He is alert. Mental status is at baseline.  Psychiatric:        Mood and Affect: Mood normal.     Lab Results  Component Value Date   WBC 4.9 07/22/2023   HGB 13.6 07/22/2023   HCT 39.9 07/22/2023   PLT 241.0 07/22/2023   GLUCOSE 99 07/22/2023   CHOL 130 07/22/2023   TRIG 62.0 07/22/2023   HDL 72.80 07/22/2023   LDLCALC 44 07/22/2023   ALT 19 07/22/2023   AST 22 07/22/2023   NA 139 07/22/2023   K 4.6 07/22/2023   CL 103 07/22/2023   CREATININE 1.10 07/22/2023   BUN 17 07/22/2023   CO2 29 07/22/2023   TSH 2.70 07/22/2023   PSA 0.64 07/22/2023   HGBA1C 5.2 01/13/2017    CT CARDIAC SCORING (SELF PAY ONLY) Addendum Date: 07/16/2020 ADDENDUM REPORT: 07/16/2020 12:37 CLINICAL DATA:  Cardiovascular Disease Risk stratification EXAM: Coronary Calcium  Score TECHNIQUE: A gated, non-contrast computed tomography scan of the heart was performed using 3mm slice thickness. Axial images were analyzed on a  dedicated workstation. Calcium  scoring of the coronary arteries was performed using the Agatston method. FINDINGS: Coronary Calcium  Score: Left main: 0 Left anterior descending artery: 0 Left circumflex artery: 0 Right coronary artery: 0 Total: 0 Percentile: NA Pericardium: Normal. Ascending Aorta: Normal caliber. Non-cardiac: See separate report from [facility]. IMPRESSION: Coronary calcium  score of 0. RECOMMENDATIONS: Coronary artery calcium  (CAC) score is a strong predictor of incident coronary heart disease (CHD) and provides predictive information beyond traditional risk factors. CAC scoring is reasonable to use in the decision to withhold, postpone, or initiate statin therapy in intermediate-risk  or selected borderline-risk asymptomatic adults (age 45-75 years and LDL-C >=70 to <190 mg/dL) who do not have diabetes or established atherosclerotic cardiovascular disease (ASCVD).* In intermediate-risk (10-year ASCVD risk >=7.5% to <20%) adults or selected borderline-risk (10-year ASCVD risk >=5% to <7.5%) adults in whom a CAC score is measured for the purpose of making a treatment decision the following recommendations have been made: If CAC=0, it is reasonable to withhold statin therapy and reassess in 5 to 10 years, as long as higher risk conditions are absent (diabetes mellitus, family history of premature CHD in first degree relatives (males <55 years; females <65 years), cigarette smoking, or LDL >=190 mg/dL). If CAC is 1 to 99, it is reasonable to initiate statin therapy for patients >=53 years of age. If CAC is >=100 or >=75th percentile, it is reasonable to initiate statin therapy at any age. Cardiology referral should be considered for patients with CAC scores >=400 or >=75th percentile. *2018 AHA/ACC/AACVPR/AAPA/ABC/ACPM/ADA/AGS/APhA/ASPC/NLA/PCNA Guideline on the Management of Blood Cholesterol: A Report of the American College of Cardiology/American Heart Association Task Force on Clinical  Practice Guidelines. J Am Coll Cardiol. 2019;73(24):3168-3209. Jackquelyn Mass, MD Electronically Signed   By: Jackquelyn Mass   On: 07/16/2020 12:37   Result Date: 07/16/2020 EXAM: OVER-READ INTERPRETATION  CT CHEST The following report is an over-read performed by radiologist Dr. Alexandria Angel of [facility] on 07/16/2020. This over-read does not include interpretation of cardiac or coronary anatomy or pathology. The coronary calcium  score interpretation by the cardiologist is attached. COMPARISON:  None. FINDINGS: 3 mm right lower lobe pulmonary nodule (axial image 7 of series 3). Within the visualized portions of the thorax there are no other larger more suspicious appearing pulmonary nodules or masses, there is no acute consolidative airspace disease, no pleural effusions, no pneumothorax and no lymphadenopathy. Visualized portions of the upper abdomen are unremarkable. There are no aggressive appearing lytic or blastic lesions noted in the visualized portions of the skeleton. IMPRESSION: 1. 3 mm right lower lobe pulmonary nodule, nonspecific, but statistically likely benign. No follow-up needed if patient is low-risk. Non-contrast chest CT can be considered in 12 months if patient is high-risk. This recommendation follows the consensus statement: Guidelines for Management of Incidental Pulmonary Nodules Detected on CT Images: From the Fleischner Society 2017; Radiology 2017; 284:228-243. Electronically Signed: By: Alexandria Angel M.D. On: 07/16/2020 10:08    Assessment & Plan:   Essential hypertension- His BP is well controlled. EKG is negative for LVH -     EKG 12-Lead -     TSH; Future -     Basic metabolic panel with GFR; Future -     CBC with Differential/Platelet; Future -     Irbesartan ; Take 1 tablet (150 mg total) by mouth daily.  Dispense: 90 tablet; Refill: 1  Hyperlipidemia with target LDL less than 100- LDL goal achieved. Doing well on the statin  -     Lipid panel;  Future -     TSH; Future -     Hepatic function panel; Future -     CK; Future -     Rosuvastatin  Calcium ; Take 1 tablet (10 mg total) by mouth daily.  Dispense: 90 tablet; Refill: 1  Benign prostatic hyperplasia without lower urinary tract symptoms -     PSA; Future -     Urinalysis, Routine w reflex microscopic; Future -     Tamsulosin  HCl; Take 1 capsule (0.4 mg total) by mouth daily.  Dispense: 90 capsule; Refill:  1  Gastroesophageal reflux disease without esophagitis -     Omeprazole ; Take 1 capsule (20 mg total) by mouth daily.  Dispense: 90 capsule; Refill: 1 -     CBC with Differential/Platelet; Future  Encounter for general adult medical examination with abnormal findings- Exam completed, labs reviewed, vaccines reviewed and updated, no cancer screenings indicated, pt ed material was given.   Need for prophylactic vaccination with combined diphtheria-tetanus-pertussis (DTP) vaccine -     Boostrix ; Inject 0.5 mLs into the muscle once for 1 dose.  Dispense: 0.5 mL; Refill: 0     Follow-up: Return in about 6 months (around 01/21/2024).  Sandra Crouch, MD

## 2023-07-23 DIAGNOSIS — Z23 Encounter for immunization: Secondary | ICD-10-CM | POA: Insufficient documentation

## 2023-07-23 MED ORDER — TAMSULOSIN HCL 0.4 MG PO CAPS: 0.4000 mg | ORAL_CAPSULE | Freq: Every day | ORAL | 1 refills | Status: DC

## 2023-07-23 MED ORDER — ROSUVASTATIN CALCIUM 10 MG PO TABS: 10.0000 mg | ORAL_TABLET | Freq: Every day | ORAL | 1 refills | Status: DC

## 2023-07-23 MED ORDER — IRBESARTAN 150 MG PO TABS: 150.0000 mg | ORAL_TABLET | Freq: Every day | ORAL | 1 refills | Status: DC

## 2023-07-23 MED ORDER — BOOSTRIX 5-2.5-18.5 LF-MCG/0.5 IM SUSP
0.5000 mL | Freq: Once | INTRAMUSCULAR | 0 refills | Status: AC
Start: 2023-07-23 — End: 2023-07-23

## 2023-07-23 MED ORDER — IRBESARTAN 150 MG PO TABS
150.0000 mg | ORAL_TABLET | Freq: Every day | ORAL | 1 refills | Status: DC
Start: 2023-07-23 — End: 2024-01-21

## 2023-08-22 ENCOUNTER — Other Ambulatory Visit: Payer: Self-pay | Admitting: Internal Medicine

## 2023-08-22 DIAGNOSIS — E785 Hyperlipidemia, unspecified: Secondary | ICD-10-CM

## 2023-09-02 DIAGNOSIS — Z85828 Personal history of other malignant neoplasm of skin: Secondary | ICD-10-CM | POA: Diagnosis not present

## 2023-09-02 DIAGNOSIS — L821 Other seborrheic keratosis: Secondary | ICD-10-CM | POA: Diagnosis not present

## 2023-09-18 ENCOUNTER — Other Ambulatory Visit: Payer: Self-pay | Admitting: Internal Medicine

## 2023-09-18 DIAGNOSIS — N400 Enlarged prostate without lower urinary tract symptoms: Secondary | ICD-10-CM

## 2023-09-30 DIAGNOSIS — K080 Exfoliation of teeth due to systemic causes: Secondary | ICD-10-CM | POA: Diagnosis not present

## 2023-10-04 ENCOUNTER — Other Ambulatory Visit: Payer: Self-pay | Admitting: Internal Medicine

## 2023-10-04 DIAGNOSIS — I10 Essential (primary) hypertension: Secondary | ICD-10-CM

## 2023-10-09 ENCOUNTER — Other Ambulatory Visit: Payer: Self-pay | Admitting: Internal Medicine

## 2023-10-09 DIAGNOSIS — N400 Enlarged prostate without lower urinary tract symptoms: Secondary | ICD-10-CM

## 2023-10-09 MED ORDER — FINASTERIDE 5 MG PO TABS: 5.0000 mg | ORAL_TABLET | Freq: Every day | ORAL | 0 refills | Status: DC

## 2023-10-09 NOTE — Telephone Encounter (Signed)
 Copied from CRM [id number]. Topic: Clinical - Medication Refill >> Oct 09, 2023 10:07 AM [place] E wrote: Medication: finasteride  (PROSCAR ) 5 MG tablet  Has the patient contacted their pharmacy? Yes (Agent: If no, request that the patient contact the pharmacy for the refill. If patient does not wish to contact the pharmacy document the reason why and proceed with request.) (Agent: If yes, when and what did the pharmacy advise?)  This is the patient's preferred pharmacy:  [id number] [place], [place] - [address] AT [facility] OF [address] [facility] [address] & [facility] [address] [place] [address] Phone: [phone] Fax: [phone]  Is this the correct pharmacy for this prescription? Yes If no, delete pharmacy and type the correct one.   Has the prescription been filled recently? Yes  Is the patient out of the medication? No, about a week left.  Has the patient been seen for an appointment in the last year OR does the patient have an upcoming appointment? Yes  Can we respond through MyChart? Yes  Agent: Please be advised that Rx refills may take up to 3 business days. We ask that you follow-up with your pharmacy.

## 2023-10-27 ENCOUNTER — Other Ambulatory Visit: Payer: Self-pay | Admitting: Internal Medicine

## 2023-10-27 DIAGNOSIS — K219 Gastro-esophageal reflux disease without esophagitis: Secondary | ICD-10-CM

## 2023-10-28 NOTE — Telephone Encounter (Signed)
 Last OV 07/22/23 Next OV 01/21/24  Last refill 07/22/23 Qty #90/1

## 2024-01-05 ENCOUNTER — Other Ambulatory Visit: Payer: Self-pay | Admitting: Internal Medicine

## 2024-01-05 DIAGNOSIS — N400 Enlarged prostate without lower urinary tract symptoms: Secondary | ICD-10-CM

## 2024-01-13 ENCOUNTER — Ambulatory Visit (INDEPENDENT_AMBULATORY_CARE_PROVIDER_SITE_OTHER)

## 2024-01-13 DIAGNOSIS — Z23 Encounter for immunization: Secondary | ICD-10-CM | POA: Diagnosis not present

## 2024-01-13 NOTE — Progress Notes (Signed)
 Pt received there flu vaccine and there were no questions or concerns

## 2024-01-16 ENCOUNTER — Other Ambulatory Visit: Payer: Self-pay | Admitting: Internal Medicine

## 2024-01-16 DIAGNOSIS — N400 Enlarged prostate without lower urinary tract symptoms: Secondary | ICD-10-CM

## 2024-01-21 ENCOUNTER — Other Ambulatory Visit: Payer: Self-pay | Admitting: Internal Medicine

## 2024-01-21 ENCOUNTER — Ambulatory Visit: Payer: Self-pay | Admitting: Internal Medicine

## 2024-01-21 ENCOUNTER — Encounter: Payer: Self-pay | Admitting: Internal Medicine

## 2024-01-21 ENCOUNTER — Ambulatory Visit: Admitting: Internal Medicine

## 2024-01-21 VITALS — BP 156/88 | HR 66 | Temp 97.6°F | Resp 16 | Ht 68.0 in | Wt 162.0 lb

## 2024-01-21 DIAGNOSIS — K219 Gastro-esophageal reflux disease without esophagitis: Secondary | ICD-10-CM | POA: Diagnosis not present

## 2024-01-21 DIAGNOSIS — E785 Hyperlipidemia, unspecified: Secondary | ICD-10-CM

## 2024-01-21 DIAGNOSIS — I10 Essential (primary) hypertension: Secondary | ICD-10-CM | POA: Diagnosis not present

## 2024-01-21 DIAGNOSIS — N400 Enlarged prostate without lower urinary tract symptoms: Secondary | ICD-10-CM

## 2024-01-21 LAB — URINALYSIS, ROUTINE W REFLEX MICROSCOPIC
Bilirubin Urine: NEGATIVE
Hgb urine dipstick: NEGATIVE
Ketones, ur: NEGATIVE
Nitrite: NEGATIVE
RBC / HPF: NONE SEEN (ref 0–?)
Specific Gravity, Urine: <=1.005 — AB (ref 1.000–1.030)
Total Protein, Urine: NEGATIVE
Urine Glucose: NEGATIVE
Urobilinogen, UA: 0.2 (ref 0.0–1.0)
pH: 6.5 (ref 5.0–8.0)

## 2024-01-21 LAB — BASIC METABOLIC PANEL WITH GFR
BUN: 17 mg/dL (ref 6–23)
CO2: 29 meq/L (ref 19–32)
Calcium: 9.2 mg/dL (ref 8.4–10.5)
Chloride: 103 meq/L (ref 96–112)
Creatinine, Ser: 1.08 mg/dL (ref 0.40–1.50)
GFR: 65.97 mL/min (ref >60.00)
Glucose, Bld: 103 mg/dL — ABNORMAL HIGH (ref 70–99)
Potassium: 4.2 meq/L (ref 3.5–5.1)
Sodium: 140 meq/L (ref 135–145)

## 2024-01-21 LAB — CBC WITH DIFFERENTIAL/PLATELET
Basophils Absolute: 0 K/uL (ref 0.0–0.1)
Basophils Relative: 0.5 % (ref 0.0–3.0)
Eosinophils Absolute: 0.2 K/uL (ref 0.0–0.7)
Eosinophils Relative: 3.4 % (ref 0.0–5.0)
HCT: 39.8 % (ref 39.0–52.0)
Hemoglobin: 13.6 g/dL (ref 13.0–17.0)
Lymphocytes Relative: 25.6 % (ref 12.0–46.0)
Lymphs Abs: 1.2 K/uL (ref 0.7–4.0)
MCHC: 34.1 g/dL (ref 30.0–36.0)
MCV: 93.3 fl (ref 78.0–100.0)
Monocytes Absolute: 0.4 K/uL (ref 0.1–1.0)
Monocytes Relative: 7.8 % (ref 3.0–12.0)
Neutro Abs: 2.9 K/uL (ref 1.4–7.7)
Neutrophils Relative %: 62.7 % (ref 43.0–77.0)
Platelets: 250 K/uL (ref 150.0–400.0)
RBC: 4.26 Mil/uL (ref 4.22–5.81)
RDW: 13 % (ref 11.5–15.5)
WBC: 4.6 K/uL (ref 4.0–10.5)

## 2024-01-21 LAB — TSH: TSH: 2.73 u[IU]/mL (ref 0.35–5.50)

## 2024-01-21 MED ORDER — METOPROLOL SUCCINATE ER 25 MG PO TB24: 25.0000 mg | ORAL_TABLET | Freq: Every day | ORAL | 1 refills | Status: AC

## 2024-01-21 MED ORDER — IRBESARTAN 150 MG PO TABS: 150.0000 mg | ORAL_TABLET | Freq: Every day | ORAL | 1 refills | Status: AC

## 2024-01-21 MED ORDER — TAMSULOSIN HCL 0.4 MG PO CAPS: 0.4000 mg | ORAL_CAPSULE | Freq: Every day | ORAL | 1 refills | Status: AC

## 2024-01-21 MED ORDER — ROSUVASTATIN CALCIUM 10 MG PO TABS: 10.0000 mg | ORAL_TABLET | Freq: Every day | ORAL | 1 refills | Status: AC

## 2024-01-21 NOTE — Patient Instructions (Signed)
 Hypertension, Adult High blood pressure (hypertension) is when the force of blood pumping through the arteries is too strong. The arteries are the blood vessels that carry blood from the heart throughout the body. Hypertension forces the heart to work harder to pump blood and may cause arteries to become narrow or stiff. Untreated or uncontrolled hypertension can lead to a heart attack, heart failure, a stroke, kidney disease, and other problems. A blood pressure reading consists of a higher number over a lower number. Ideally, your blood pressure should be below 120/80. The first ("top") number is called the systolic pressure. It is a measure of the pressure in your arteries as your heart beats. The second ("bottom") number is called the diastolic pressure. It is a measure of the pressure in your arteries as the heart relaxes. What are the causes? The exact cause of this condition is not known. There are some conditions that result in high blood pressure. What increases the risk? Certain factors may make you more likely to develop high blood pressure. Some of these risk factors are under your control, including: Smoking. Not getting enough exercise or physical activity. Being overweight. Having too much fat, sugar, calories, or salt (sodium) in your diet. Drinking too much alcohol. Other risk factors include: Having a personal history of heart disease, diabetes, high cholesterol, or kidney disease. Stress. Having a family history of high blood pressure and high cholesterol. Having obstructive sleep apnea. Age. The risk increases with age. What are the signs or symptoms? High blood pressure may not cause symptoms. Very high blood pressure (hypertensive crisis) may cause: Headache. Fast or irregular heartbeats (palpitations). Shortness of breath. Nosebleed. Nausea and vomiting. Vision changes. Severe chest pain, dizziness, and seizures. How is this diagnosed? This condition is diagnosed by  measuring your blood pressure while you are seated, with your arm resting on a flat surface, your legs uncrossed, and your feet flat on the floor. The cuff of the blood pressure monitor will be placed directly against the skin of your upper arm at the level of your heart. Blood pressure should be measured at least twice using the same arm. Certain conditions can cause a difference in blood pressure between your right and left arms. If you have a high blood pressure reading during one visit or you have normal blood pressure with other risk factors, you may be asked to: Return on a different day to have your blood pressure checked again. Monitor your blood pressure at home for 1 week or longer. If you are diagnosed with hypertension, you may have other blood or imaging tests to help your health care provider understand your overall risk for other conditions. How is this treated? This condition is treated by making healthy lifestyle changes, such as eating healthy foods, exercising more, and reducing your alcohol intake. You may be referred for counseling on a healthy diet and physical activity. Your health care provider may prescribe medicine if lifestyle changes are not enough to get your blood pressure under control and if: Your systolic blood pressure is above 130. Your diastolic blood pressure is above 80. Your personal target blood pressure may vary depending on your medical conditions, your age, and other factors. Follow these instructions at home: Eating and drinking  Eat a diet that is high in fiber and potassium, and low in sodium, added sugar, and fat. An example of this eating plan is called the DASH diet. DASH stands for Dietary Approaches to Stop Hypertension. To eat this way: Eat  plenty of fresh fruits and vegetables. Try to fill one half of your plate at each meal with fruits and vegetables. Eat whole grains, such as whole-wheat pasta, brown rice, or whole-grain bread. Fill about one  fourth of your plate with whole grains. Eat or drink low-fat dairy products, such as skim milk or low-fat yogurt. Avoid fatty cuts of meat, processed or cured meats, and poultry with skin. Fill about one fourth of your plate with lean proteins, such as fish, chicken without skin, beans, eggs, or tofu. Avoid pre-made and processed foods. These tend to be higher in sodium, added sugar, and fat. Reduce your daily sodium intake. Many people with hypertension should eat less than 1,500 mg of sodium a day. Do not drink alcohol if: Your health care provider tells you not to drink. You are pregnant, may be pregnant, or are planning to become pregnant. If you drink alcohol: Limit how much you have to: 0-1 drink a day for women. 0-2 drinks a day for men. Know how much alcohol is in your drink. In the [place] one drink equals one 12 oz bottle of beer (355 mL), one 5 oz glass of wine (148 mL), or one 1 oz glass of hard liquor (44 mL). Lifestyle  Work with your health care provider to maintain a healthy body weight or to lose weight. Ask what an ideal weight is for you. Get at least 30 minutes of exercise that causes your heart to beat faster (aerobic exercise) most days of the week. Activities may include walking, swimming, or biking. Include exercise to strengthen your muscles (resistance exercise), such as Pilates or lifting weights, as part of your weekly exercise routine. Try to do these types of exercises for 30 minutes at least 3 days a week. Do not use any products that contain nicotine or tobacco. These products include cigarettes, chewing tobacco, and vaping devices, such as e-cigarettes. If you need help quitting, ask your health care provider. Monitor your blood pressure at home as told by your health care provider. Keep all follow-up visits. This is important. Medicines Take over-the-counter and prescription medicines only as told by your health care provider. Follow directions carefully. Blood  pressure medicines must be taken as prescribed. Do not skip doses of blood pressure medicine. Doing this puts you at risk for problems and can make the medicine less effective. Ask your health care provider about side effects or reactions to medicines that you should watch for. Contact a health care provider if you: Think you are having a reaction to a medicine you are taking. Have headaches that keep coming back (recurring). Feel dizzy. Have swelling in your ankles. Have trouble with your vision. Get help right away if you: Develop a severe headache or confusion. Have unusual weakness or numbness. Feel faint. Have severe pain in your chest or abdomen. Vomit repeatedly. Have trouble breathing. These symptoms may be an emergency. Get help right away. Call 911. Do not wait to see if the symptoms will go away. Do not drive yourself to the hospital. Summary Hypertension is when the force of blood pumping through your arteries is too strong. If this condition is not controlled, it may put you at risk for serious complications. Your personal target blood pressure may vary depending on your medical conditions, your age, and other factors. For most people, a normal blood pressure is less than 120/80. Hypertension is treated with lifestyle changes, medicines, or a combination of both. Lifestyle changes include losing weight, eating a healthy,  low-sodium diet, exercising more, and limiting alcohol. This information is not intended to replace advice given to you by your health care provider. Make sure you discuss any questions you have with your health care provider. Document Revised: 01/15/2021 Document Reviewed: 01/15/2021 Elsevier Patient Education  2024 [employer].

## 2024-01-21 NOTE — Progress Notes (Signed)
 Subjective:  Patient ID: Anthony Collins, male    DOB: [birth year]  Age: 78 y.o. MRN: [id number]  CC: Hypertension, Hyperlipidemia, and Gastroesophageal Reflux   HPI Anthony Collins presents for f/up ---  Discussed the use of AI scribe software for clinical note transcription with the patient, who gave verbal consent to proceed.  History of Present Illness Anthony Collins is a 78 year old male who presents for routine follow-up regarding heart rate and blood pressure management.  He monitors his heart rate, which typically ranges from 58 to 63 beats per minute in the mornings. No pain, shortness of breath, or dizziness during exercise. He maintains an active lifestyle, going to the gym twice a week for muscle strengthening exercises since February.  He has been on the same medications for 15 to 18 years. His blood pressure tends to be higher during clinic visits due to nervousness, but it usually normalizes before he leaves.  A year ago, he experienced diarrhea but has since improved his bowel movements by increasing bran intake. No changes in weight or appetite.  He received a flu shot last week.   Outpatient Medications Prior to Visit  Medication Sig Dispense Refill   Bacillus Coagulans-Inulin (PROBIOTIC FORMULA PO) Take by mouth.     Cholecalciferol (VITAMIN D3) 1000 UNITS CAPS Take 1 capsule by mouth daily.     clotrimazole -betamethasone  (LOTRISONE ) cream Apply topically 2 (two) times daily. 45 g 3   cyanocobalamin  1000 MCG tablet Take 1,000 mcg by mouth daily.     finasteride  (PROSCAR ) 5 MG tablet TAKE 1 TABLET(5 MG) BY MOUTH DAILY 90 tablet 0   Lutein-Zeaxanthin 20-1 MG CAPS Take 1 capsule by mouth daily.     omeprazole  (PRILOSEC) 20 MG capsule TAKE 1 CAPSULE(20 MG) BY MOUTH DAILY 90 capsule 1   irbesartan  (AVAPRO ) 150 MG tablet Take 1 tablet (150 mg total) by mouth daily. 90 tablet 1   metoprolol  succinate (TOPROL -XL) 25 MG 24 hr tablet TAKE 1 TABLET BY MOUTH EVERY  DAY 90 tablet 0   rosuvastatin  (CRESTOR ) 10 MG tablet Take 1 tablet (10 mg total) by mouth daily. 90 tablet 1   tamsulosin  (FLOMAX ) 0.4 MG CAPS capsule Take 1 capsule (0.4 mg total) by mouth daily. 90 capsule 1   No facility-administered medications prior to visit.    ROS Review of Systems  Constitutional: Negative.  Negative for appetite change, fatigue and fever.  HENT: Negative.    Eyes: Negative.   Respiratory: Negative.  Negative for cough, chest tightness, shortness of breath and wheezing.   Cardiovascular:  Negative for chest pain, palpitations and leg swelling.  Gastrointestinal: Negative.  Negative for abdominal pain, constipation, diarrhea, nausea and vomiting.  Endocrine: Negative.   Genitourinary: Negative.  Negative for difficulty urinating and dysuria.  Musculoskeletal: Negative.  Negative for arthralgias and myalgias.  Skin: Negative.   Neurological: Negative.  Negative for dizziness and weakness.  Hematological:  Negative for adenopathy. Does not bruise/bleed easily.  Psychiatric/Behavioral: Negative.      Objective:  BP (!) 156/88 (BP Location: Left Arm, Patient Position: Sitting, Cuff Size: Normal)   Pulse 66   Temp 97.6 F (36.4 C) (Oral)   Resp 16   Ht 5' 8 (1.727 m)   Wt 162 lb (73.5 kg)   SpO2 99%   BMI 24.63 kg/m   BP Readings from Last 3 Encounters:  01/21/24 (!) 156/88  07/22/23 (!) 140/72  01/21/23 134/74    Wt Readings from  Last 3 Encounters:  01/21/24 162 lb (73.5 kg)  07/22/23 159 lb 6.4 oz (72.3 kg)  06/29/23 157 lb (71.2 kg)    Physical Exam Vitals reviewed.  HENT:     Nose: Nose normal.     Mouth/Throat:     Mouth: Mucous membranes are moist.  Eyes:     General: No scleral icterus.    Conjunctiva/sclera: Conjunctivae normal.  Cardiovascular:     Rate and Rhythm: Normal rate and regular rhythm.     Heart sounds: Normal heart sounds, S1 normal and S2 normal. No murmur heard.    No friction rub. No gallop.     Comments:  EKG- NSR, 62 bpm No LVH, Q waves, or ST/T wave changes  Pulmonary:     Effort: Pulmonary effort is normal.     Breath sounds: No stridor. No wheezing, rhonchi or rales.  Abdominal:     General: Abdomen is flat.     Palpations: There is no mass.     Tenderness: There is no abdominal tenderness. There is no guarding.     Hernia: No hernia is present.  Musculoskeletal:        General: No swelling.     Cervical back: Neck supple.     Right lower leg: No edema.     Left lower leg: No edema.  Skin:    General: Skin is warm and dry.     Findings: No rash.  Neurological:     General: No focal deficit present.     Mental Status: He is alert. Mental status is at baseline.  Psychiatric:        Mood and Affect: Mood normal.        Behavior: Behavior normal.     Lab Results  Component Value Date   WBC 4.6 01/21/2024   HGB 13.6 01/21/2024   HCT 39.8 01/21/2024   PLT 250.0 01/21/2024   GLUCOSE 103 (H) 01/21/2024   CHOL 130 07/22/2023   TRIG 62.0 07/22/2023   HDL 72.80 07/22/2023   LDLCALC 44 07/22/2023   ALT 19 07/22/2023   AST 22 07/22/2023   NA 140 01/21/2024   K 4.2 01/21/2024   CL 103 01/21/2024   CREATININE 1.08 01/21/2024   BUN 17 01/21/2024   CO2 29 01/21/2024   TSH 2.73 01/21/2024   PSA 0.64 07/22/2023   HGBA1C 5.2 01/13/2017    CT CARDIAC SCORING (SELF PAY ONLY) Addendum Date: 07/16/2020 ADDENDUM REPORT: 07/16/2020 12:37 CLINICAL DATA:  Cardiovascular Disease Risk stratification EXAM: Coronary Calcium  Score TECHNIQUE: A gated, non-contrast computed tomography scan of the heart was performed using 3mm slice thickness. Axial images were analyzed on a dedicated workstation. Calcium  scoring of the coronary arteries was performed using the Agatston method. FINDINGS: Coronary Calcium  Score: Left main: 0 Left anterior descending artery: 0 Left circumflex artery: 0 Right coronary artery: 0 Total: 0 Percentile: NA Pericardium: Normal. Ascending Aorta: Normal caliber.  Non-cardiac: See separate report from [facility]. IMPRESSION: Coronary calcium  score of 0. RECOMMENDATIONS: Coronary artery calcium  (CAC) score is a strong predictor of incident coronary heart disease (CHD) and provides predictive information beyond traditional risk factors. CAC scoring is reasonable to use in the decision to withhold, postpone, or initiate statin therapy in intermediate-risk or selected borderline-risk asymptomatic adults (age 82-75 years and LDL-C >=70 to <190 mg/dL) who do not have diabetes or established atherosclerotic cardiovascular disease (ASCVD).* In intermediate-risk (10-year ASCVD risk >=7.5% to <20%) adults or selected borderline-risk (10-year ASCVD risk >=5% to <7.5%)  adults in whom a CAC score is measured for the purpose of making a treatment decision the following recommendations have been made: If CAC=0, it is reasonable to withhold statin therapy and reassess in 5 to 10 years, as long as higher risk conditions are absent (diabetes mellitus, family history of premature CHD in first degree relatives (males <55 years; females <65 years), cigarette smoking, or LDL >=190 mg/dL). If CAC is 1 to 99, it is reasonable to initiate statin therapy for patients >=42 years of age. If CAC is >=100 or >=75th percentile, it is reasonable to initiate statin therapy at any age. Cardiology referral should be considered for patients with CAC scores >=400 or >=75th percentile. *2018 AHA/ACC/AACVPR/AAPA/ABC/ACPM/ADA/AGS/APhA/ASPC/NLA/PCNA Guideline on the Management of Blood Cholesterol: A Report of the American College of Cardiology/American Heart Association Task Force on Clinical Practice Guidelines. J Am Coll Cardiol. 2019;73(24):3168-3209. Darryle Decent, MD Electronically Signed   By: Darryle Decent   On: 07/16/2020 12:37   Result Date: 07/16/2020 EXAM: OVER-READ INTERPRETATION  CT CHEST The following report is an over-read performed by radiologist Dr. Toribio Aye of [facility] on 07/16/2020. This over-read does not include interpretation of cardiac or coronary anatomy or pathology. The coronary calcium  score interpretation by the cardiologist is attached. COMPARISON:  None. FINDINGS: 3 mm right lower lobe pulmonary nodule (axial image 7 of series 3). Within the visualized portions of the thorax there are no other larger more suspicious appearing pulmonary nodules or masses, there is no acute consolidative airspace disease, no pleural effusions, no pneumothorax and no lymphadenopathy. Visualized portions of the upper abdomen are unremarkable. There are no aggressive appearing lytic or blastic lesions noted in the visualized portions of the skeleton. IMPRESSION: 1. 3 mm right lower lobe pulmonary nodule, nonspecific, but statistically likely benign. No follow-up needed if patient is low-risk. Non-contrast chest CT can be considered in 12 months if patient is high-risk. This recommendation follows the consensus statement: Guidelines for Management of Incidental Pulmonary Nodules Detected on CT Images: From the Fleischner Society 2017; Radiology 2017; 284:228-243. Electronically Signed: By: Toribio Aye M.D. On: 07/16/2020 10:08    Assessment & Plan:   Essential hypertension- BP is adequately well controlled. EKG is negative for LVH. -     Basic metabolic panel with GFR; Future -     CBC with Differential/Platelet; Future -     EKG 12-Lead -     TSH; Future -     Urinalysis, Routine w reflex microscopic; Future -     Metoprolol  Succinate ER; Take 1 tablet (25 mg total) by mouth daily.  Dispense: 90 tablet; Refill: 1 -     Irbesartan ; Take 1 tablet (150 mg total) by mouth daily.  Dispense: 90 tablet; Refill: 1  Hyperlipidemia with target LDL less than 100- LDL goal achieved. Doing well on the statin  -     TSH; Future -     Rosuvastatin  Calcium ; Take 1 tablet (10 mg total) by mouth daily.  Dispense: 90 tablet; Refill: 1  Gastroesophageal reflux disease  without esophagitis -     CBC with Differential/Platelet; Future  Benign prostatic hyperplasia without lower urinary tract symptoms -     Tamsulosin  HCl; Take 1 capsule (0.4 mg total) by mouth daily.  Dispense: 90 capsule; Refill: 1     Follow-up: Return in about 6 months (around 07/21/2024).  Debby Molt, MD

## 2024-01-27 DIAGNOSIS — H52203 Unspecified astigmatism, bilateral: Secondary | ICD-10-CM | POA: Diagnosis not present

## 2024-03-09 DIAGNOSIS — L821 Other seborrheic keratosis: Secondary | ICD-10-CM | POA: Diagnosis not present

## 2024-03-09 DIAGNOSIS — Z85828 Personal history of other malignant neoplasm of skin: Secondary | ICD-10-CM | POA: Diagnosis not present

## 2024-03-09 DIAGNOSIS — L570 Actinic keratosis: Secondary | ICD-10-CM | POA: Diagnosis not present

## 2024-03-09 DIAGNOSIS — L738 Other specified follicular disorders: Secondary | ICD-10-CM | POA: Diagnosis not present

## 2024-06-29 ENCOUNTER — Ambulatory Visit

## 2024-07-21 ENCOUNTER — Ambulatory Visit: Admitting: Internal Medicine

## 2024-07-22 ENCOUNTER — Ambulatory Visit

## 2024-07-27 ENCOUNTER — Encounter: Admitting: Internal Medicine
